# Patient Record
Sex: Male | Born: 1955 | Marital: Single | Smoking: Former smoker
Health system: Southern US, Community
[De-identification: ages and names within clinical notes are randomized; demographics above are authoritative.]

## PROBLEM LIST (undated history)

## (undated) ENCOUNTER — Emergency Department (HOSPITAL_BASED_OUTPATIENT_CLINIC_OR_DEPARTMENT_OTHER): Admission: EM | Payer: Medicare PPO

## (undated) DIAGNOSIS — K921 Melena: Secondary | ICD-10-CM

## (undated) DIAGNOSIS — C801 Malignant (primary) neoplasm, unspecified: Secondary | ICD-10-CM

## (undated) HISTORY — PX: EYE SURGERY: SHX253

## (undated) HISTORY — PX: KNEE SURGERY: SHX244

## (undated) HISTORY — DX: Melena: K92.1

---

## 2014-08-05 ENCOUNTER — Other Ambulatory Visit (HOSPITAL_COMMUNITY)
Admission: RE | Admit: 2014-08-05 | Discharge: 2014-08-05 | Disposition: A | Discharge status: Home / Self Care | Payer: BC Managed Care – PPO | Source: Ambulatory Visit | Attending: Otolaryngology | Admitting: Otolaryngology

## 2014-08-05 ENCOUNTER — Other Ambulatory Visit: Payer: Self-pay | Admitting: Otolaryngology

## 2014-08-05 DIAGNOSIS — R221 Localized swelling, mass and lump, neck: Secondary | ICD-10-CM | POA: Diagnosis present

## 2014-08-05 DIAGNOSIS — C801 Malignant (primary) neoplasm, unspecified: Secondary | ICD-10-CM

## 2014-08-05 HISTORY — DX: Malignant (primary) neoplasm, unspecified: C80.1

## 2014-08-05 HISTORY — PX: OTHER SURGICAL HISTORY: SHX169

## 2014-08-12 ENCOUNTER — Telehealth: Payer: Self-pay | Admitting: *Deleted

## 2014-08-12 NOTE — Telephone Encounter (Signed)
Left 2nd VM for [facility] with request for patient information.  Gayleen Orem, RN, BSN, [place] at [place] [phone]

## 2014-08-12 NOTE — Telephone Encounter (Signed)
Called pt to introduce myself as the oncology nurse navigator that works with Dr. Isidore Moos to whom he has been referred by Dr. Redmond Baseman, [facility]. 1. He confirmed his understanding of the referral and upcoming appt with Dr. Isidore Moos on 08/19/14 at 8:00/8:30. 2. He reported he had a CT of head/neck on 08/01/14 followed by 08/05/14 bx L tonsil and LNs.  He indicated he would fax a copy of bx report, provide a disc copy of CT scan [employer]). 3. He indicated he has consultation at [place] this Friday @ 11:00 with Dr. Berdine Addison for an additional opinion.  He agreed to call me after the appt and provide visit summary.  I encouraged him to keep appt with Dr. Isidore Moos so he can make an informed decision about his treatment and provider. 4. 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 5. I provided my contact information, encouraged him to call with questions/concerns.  Gayleen Orem, RN, BSN, [place] at [place] [phone]

## 2014-08-12 NOTE — Telephone Encounter (Signed)
LVM for [facility] with [facility] Medical Records, requesting patient information.  Asked her to return my call.  Gayleen Orem, RN, BSN, [place] at [place] [phone]

## 2014-08-12 NOTE — Telephone Encounter (Signed)
Libby with [facility] LVM indicating available information for patient is being faxed to my attention.  Gayleen Orem, RN, BSN, [place] at [place] [phone]

## 2014-08-14 ENCOUNTER — Encounter: Payer: Self-pay | Admitting: *Deleted

## 2014-08-14 ENCOUNTER — Ambulatory Visit
Admission: RE | Admit: 2014-08-14 | Discharge: 2014-08-14 | Disposition: A | Discharge status: Home / Self Care | Payer: Self-pay | Source: Ambulatory Visit | Attending: Radiation Oncology | Admitting: Radiation Oncology

## 2014-08-14 ENCOUNTER — Other Ambulatory Visit: Payer: Self-pay | Admitting: Radiation Oncology

## 2014-08-14 DIAGNOSIS — C099 Malignant neoplasm of tonsil, unspecified: Secondary | ICD-10-CM

## 2014-08-14 NOTE — Progress Notes (Signed)
Received yesterday disc from patient with recent CT scan conducted by [employer].  I delivered to [facility] this morning for import to [facility] so available for patient's consults with Drs. Isidore Moos and [place] next week Tuesday and Wednesday, respectively.  Gayleen Orem, RN, BSN, [place] at [place] [phone]

## 2014-08-18 ENCOUNTER — Telehealth: Payer: Self-pay | Admitting: *Deleted

## 2014-08-18 ENCOUNTER — Encounter: Payer: Self-pay | Admitting: Radiation Oncology

## 2014-08-18 NOTE — Telephone Encounter (Signed)
FYI Rick

## 2014-08-18 NOTE — Telephone Encounter (Signed)
Mother left VM states pt is going to be treated at [facility] and to cancel his appts w/ Dr. Alvy Bimler on 3/30.   Appts canceled.

## 2014-08-19 ENCOUNTER — Ambulatory Visit
Admission: RE | Admit: 2014-08-19 | Discharge: 2014-08-19 | Disposition: A | Discharge status: Home / Self Care | Payer: BC Managed Care – PPO | Source: Ambulatory Visit | Attending: Radiation Oncology | Admitting: Radiation Oncology

## 2014-08-19 ENCOUNTER — Ambulatory Visit: Payer: BC Managed Care – PPO

## 2014-08-19 HISTORY — DX: Malignant (primary) neoplasm, unspecified: C80.1

## 2014-08-20 ENCOUNTER — Ambulatory Visit: Payer: BC Managed Care – PPO | Admitting: Hematology and Oncology

## 2014-08-20 ENCOUNTER — Ambulatory Visit: Payer: BC Managed Care – PPO

## 2014-12-30 ENCOUNTER — Other Ambulatory Visit: Payer: Self-pay | Admitting: Podiatry

## 2014-12-30 ENCOUNTER — Ambulatory Visit (INDEPENDENT_AMBULATORY_CARE_PROVIDER_SITE_OTHER): Payer: BC Managed Care – PPO | Admitting: Podiatry

## 2014-12-30 ENCOUNTER — Encounter: Payer: Self-pay | Admitting: Podiatry

## 2014-12-30 ENCOUNTER — Ambulatory Visit (INDEPENDENT_AMBULATORY_CARE_PROVIDER_SITE_OTHER): Payer: BC Managed Care – PPO

## 2014-12-30 VITALS — BP 111/74 | HR 76 | Resp 16 | Ht 70.0 in | Wt 130.0 lb

## 2014-12-30 DIAGNOSIS — M2011 Hallux valgus (acquired), right foot: Secondary | ICD-10-CM | POA: Diagnosis not present

## 2014-12-30 DIAGNOSIS — Z0189 Encounter for other specified special examinations: Secondary | ICD-10-CM

## 2014-12-30 DIAGNOSIS — M2012 Hallux valgus (acquired), left foot: Secondary | ICD-10-CM | POA: Diagnosis not present

## 2014-12-30 DIAGNOSIS — M79672 Pain in left foot: Secondary | ICD-10-CM

## 2014-12-30 NOTE — Progress Notes (Signed)
   Subjective:    Patient ID: Bradley Oliver. [place], male    DOB: 11-Jan-1956, 59 y.o.   MRN: [id number]  HPI Patient presents with foot pain in their Left foot, arch of foot. Pt stated, "no pain now, but did several months ago".  Patient would also like to discuss his orthotics.  Patient also presents with a bunion, Left foot, medial side. This has been going on for more than 10 years.   Review of Systems  All other systems reviewed and are negative.      Objective:   Physical Exam: I have reviewed his past medical history medications allergies. Social history review of systems. Pulses are intact bilateral. Neurologic sensorium is intact percent once the monofilament. Deep tendon reflexes are intact bilateral muscle strength is 5 over 5 dorsiflexion plantar flexors and inverters everters all intrinsic musculature is intact. Orthopedic evaluation of his result joints distal to the ankle for range of motion without crepitation. 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 Radiographs do demonstrate and confirm hallux abductovalgus deformity of the left foot. Contralateral foot does not demonstrate any type of osseus abnormalities at all. Physical findings do not replicate any pain on physical evaluation. Cutaneous evaluation Mr. is supple well-hydrated cutis no erythema edema cellulitis drainage or odor.        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  Plan: Discussed etiology pathology conservative versus surgical therapies. I will follow up with him on an as-needed basis.

## 2015-05-05 ENCOUNTER — Encounter: Payer: Self-pay | Admitting: Podiatry

## 2015-05-05 ENCOUNTER — Ambulatory Visit (INDEPENDENT_AMBULATORY_CARE_PROVIDER_SITE_OTHER): Payer: BC Managed Care – PPO | Admitting: Podiatry

## 2015-05-05 VITALS — BP 120/69 | HR 57 | Resp 16

## 2015-05-05 DIAGNOSIS — M898X9 Other specified disorders of bone, unspecified site: Secondary | ICD-10-CM | POA: Diagnosis not present

## 2015-05-05 DIAGNOSIS — M2012 Hallux valgus (acquired), left foot: Secondary | ICD-10-CM | POA: Diagnosis not present

## 2015-05-05 NOTE — Progress Notes (Signed)
Bradley Oliver presents today for surgical consult regarding bunion to his left foot. As well as a dorsal exostosis.   Objective: vital signs are stable he is alert and oriented 3 are reviewed his old radiographs consistent with hypertrophic medial condyle to the head of the first metatarsal left foot and a small dorsal exostosis at the first metatarsal second metatarsal cuneiform joint. These are mildly tender to palpation with erythema overlying the medial aspect of the first metatarsal phalangeal joint.  Assessment: hallux abductovalgus deformity mild. Dorsal tarsal exostosis left foot.  Plan: consistent today for surgical procedure consisting of a McBride bunion repair left foot. Dorsal tarsal exostectomy left foot. We did discuss possible postop complications which may include but are not limited to postop pain bleeding as well as infection recurrence need for further surgery possible digit loss of limb loss of life.  He saw Dr. Patient the consent form and dispensed a cam walker.

## 2015-05-05 NOTE — Patient Instructions (Signed)
Pre-Operative Instructions  Congratulations, you have decided to take an important step to improving your quality of life.  You can be assured that the doctors of [facility] will be with you every step of the way.  1. Plan to be at the surgery center/hospital at least 1 (one) hour prior to your scheduled time unless otherwise directed by the surgical center/hospital staff.  You must have a responsible adult accompany you, remain during the surgery and drive you home.  Make sure you have directions to the surgical center/hospital and know how to get there on time. 2. For hospital based surgery you will need to obtain a history and physical form from your family physician within 1 month prior to the date of surgery- we will give you a form for you primary physician.  3. We make every effort to accommodate the date you request for surgery.  There are however, times where surgery dates or times have to be moved.  We will contact you as soon as possible if a change in schedule is required.   4. No Aspirin/Ibuprofen for one week before surgery.  If you are on aspirin, any non-steroidal anti-inflammatory medications (Mobic, Aleve, Ibuprofen) you should stop taking it 7 days prior to your surgery.  You make take Tylenol  For pain prior to surgery.  5. Medications- If you are taking daily heart and blood pressure medications, seizure, reflux, allergy, asthma, anxiety, pain or diabetes medications, make sure the surgery center/hospital is aware before the day of surgery so they may notify you which medications to take or avoid the day of surgery. 6. No food or drink after midnight the night before surgery unless directed otherwise by surgical center/hospital staff. 7. No alcoholic beverages 24 hours prior to surgery.  No smoking 24 hours prior to or 24 hours after surgery. 8. Wear loose pants or shorts- loose enough to fit over bandages, boots, and casts. 9. No slip on shoes, sneakers are best. 10. Bring  your boot with you to the surgery center/hospital.  Also bring crutches or a walker if your physician has prescribed it for you.  If you do not have this equipment, it will be provided for you after surgery. 11. If you have not been contracted by the surgery center/hospital by the day before your surgery, call to confirm the date and time of your surgery. 12. Leave-time from work may vary depending on the type of surgery you have.  Appropriate arrangements should be made prior to surgery with your employer. 13. Prescriptions will be provided immediately following surgery by your doctor.  Have these filled as soon as possible after surgery and take the medication as directed. 14. Remove nail polish on the operative foot. 15. Wash the night before surgery.  The night before surgery wash the foot and leg well with the antibacterial soap provided and water paying special attention to beneath the toenails and in between the toes.  Rinse thoroughly with water and dry well with a towel.  Perform this wash unless told not to do so by your physician.  Enclosed: 1 Ice pack (please put in freezer the night before surgery)   1 Hibiclens skin cleaner   Pre-op Instructions  If you have any questions regarding the instructions, do not hesitate to call our office.  [place]: [address] [place], [place] [address] [phone]  [place]: [address] [place], [place] [address] [phone]  [place]: [address]  [place], [place] [address] [phone]  Dr. Richard   Tuchman DPM, Dr. Norman Regal DPM Dr. Richard Sikora DPM, Dr. M. Todd Hyatt DPM, Dr. Kathryn Egerton DPM 

## 2015-05-07 ENCOUNTER — Telehealth: Payer: Self-pay | Admitting: *Deleted

## 2015-05-07 NOTE — Telephone Encounter (Signed)
Pt's mtr, Joycelyn Schmid states she has permission to discuss pt's upcoming bunion surgery.

## 2015-09-24 DIAGNOSIS — M25562 Pain in left knee: Secondary | ICD-10-CM | POA: Diagnosis not present

## 2015-09-24 DIAGNOSIS — M25552 Pain in left hip: Secondary | ICD-10-CM | POA: Diagnosis not present

## 2015-09-24 DIAGNOSIS — M1712 Unilateral primary osteoarthritis, left knee: Secondary | ICD-10-CM | POA: Diagnosis not present

## 2015-09-24 DIAGNOSIS — G8929 Other chronic pain: Secondary | ICD-10-CM | POA: Diagnosis not present

## 2015-10-12 DIAGNOSIS — M1712 Unilateral primary osteoarthritis, left knee: Secondary | ICD-10-CM | POA: Diagnosis not present

## 2015-10-21 DIAGNOSIS — M19072 Primary osteoarthritis, left ankle and foot: Secondary | ICD-10-CM | POA: Diagnosis not present

## 2015-10-21 DIAGNOSIS — M79672 Pain in left foot: Secondary | ICD-10-CM | POA: Diagnosis not present

## 2015-10-21 DIAGNOSIS — M2012 Hallux valgus (acquired), left foot: Secondary | ICD-10-CM | POA: Diagnosis not present

## 2015-11-05 DIAGNOSIS — C770 Secondary and unspecified malignant neoplasm of lymph nodes of head, face and neck: Secondary | ICD-10-CM | POA: Diagnosis not present

## 2015-11-05 DIAGNOSIS — Z85818 Personal history of malignant neoplasm of other sites of lip, oral cavity, and pharynx: Secondary | ICD-10-CM | POA: Diagnosis not present

## 2015-11-05 DIAGNOSIS — E039 Hypothyroidism, unspecified: Secondary | ICD-10-CM | POA: Diagnosis not present

## 2015-11-05 DIAGNOSIS — Z79899 Other long term (current) drug therapy: Secondary | ICD-10-CM | POA: Diagnosis not present

## 2015-11-05 DIAGNOSIS — C099 Malignant neoplasm of tonsil, unspecified: Secondary | ICD-10-CM | POA: Diagnosis not present

## 2015-11-05 DIAGNOSIS — Z87891 Personal history of nicotine dependence: Secondary | ICD-10-CM | POA: Diagnosis not present

## 2015-11-05 DIAGNOSIS — Z08 Encounter for follow-up examination after completed treatment for malignant neoplasm: Secondary | ICD-10-CM | POA: Diagnosis not present

## 2015-11-23 DIAGNOSIS — Z923 Personal history of irradiation: Secondary | ICD-10-CM | POA: Diagnosis not present

## 2015-11-23 DIAGNOSIS — C099 Malignant neoplasm of tonsil, unspecified: Secondary | ICD-10-CM | POA: Diagnosis not present

## 2015-11-26 DIAGNOSIS — Z8639 Personal history of other endocrine, nutritional and metabolic disease: Secondary | ICD-10-CM | POA: Diagnosis not present

## 2015-11-26 DIAGNOSIS — R634 Abnormal weight loss: Secondary | ICD-10-CM | POA: Diagnosis not present

## 2015-11-26 DIAGNOSIS — Z923 Personal history of irradiation: Secondary | ICD-10-CM | POA: Diagnosis not present

## 2015-11-26 DIAGNOSIS — K219 Gastro-esophageal reflux disease without esophagitis: Secondary | ICD-10-CM | POA: Diagnosis not present

## 2015-11-26 DIAGNOSIS — G8929 Other chronic pain: Secondary | ICD-10-CM | POA: Diagnosis not present

## 2015-11-26 DIAGNOSIS — C099 Malignant neoplasm of tonsil, unspecified: Secondary | ICD-10-CM | POA: Diagnosis not present

## 2015-11-26 DIAGNOSIS — M25562 Pain in left knee: Secondary | ICD-10-CM | POA: Diagnosis not present

## 2015-11-26 DIAGNOSIS — Z01818 Encounter for other preprocedural examination: Secondary | ICD-10-CM | POA: Diagnosis not present

## 2015-11-26 DIAGNOSIS — M1712 Unilateral primary osteoarthritis, left knee: Secondary | ICD-10-CM | POA: Diagnosis not present

## 2015-12-30 DIAGNOSIS — Z87891 Personal history of nicotine dependence: Secondary | ICD-10-CM | POA: Diagnosis not present

## 2015-12-30 DIAGNOSIS — K219 Gastro-esophageal reflux disease without esophagitis: Secondary | ICD-10-CM | POA: Diagnosis not present

## 2015-12-30 DIAGNOSIS — E162 Hypoglycemia, unspecified: Secondary | ICD-10-CM | POA: Diagnosis not present

## 2015-12-30 DIAGNOSIS — G8918 Other acute postprocedural pain: Secondary | ICD-10-CM | POA: Diagnosis not present

## 2015-12-30 DIAGNOSIS — Z471 Aftercare following joint replacement surgery: Secondary | ICD-10-CM | POA: Diagnosis not present

## 2015-12-30 DIAGNOSIS — Z8639 Personal history of other endocrine, nutritional and metabolic disease: Secondary | ICD-10-CM | POA: Diagnosis not present

## 2015-12-30 DIAGNOSIS — M1712 Unilateral primary osteoarthritis, left knee: Secondary | ICD-10-CM | POA: Diagnosis not present

## 2015-12-30 DIAGNOSIS — Z9221 Personal history of antineoplastic chemotherapy: Secondary | ICD-10-CM | POA: Diagnosis not present

## 2015-12-30 DIAGNOSIS — R1310 Dysphagia, unspecified: Secondary | ICD-10-CM | POA: Diagnosis not present

## 2015-12-30 DIAGNOSIS — M25562 Pain in left knee: Secondary | ICD-10-CM | POA: Diagnosis not present

## 2015-12-30 DIAGNOSIS — Z923 Personal history of irradiation: Secondary | ICD-10-CM | POA: Diagnosis not present

## 2015-12-30 DIAGNOSIS — Z7982 Long term (current) use of aspirin: Secondary | ICD-10-CM | POA: Diagnosis not present

## 2015-12-30 DIAGNOSIS — Z85818 Personal history of malignant neoplasm of other sites of lip, oral cavity, and pharynx: Secondary | ICD-10-CM | POA: Diagnosis not present

## 2015-12-30 DIAGNOSIS — K210 Gastro-esophageal reflux disease with esophagitis: Secondary | ICD-10-CM | POA: Diagnosis not present

## 2015-12-30 DIAGNOSIS — Z96652 Presence of left artificial knee joint: Secondary | ICD-10-CM | POA: Diagnosis not present

## 2015-12-30 DIAGNOSIS — C099 Malignant neoplasm of tonsil, unspecified: Secondary | ICD-10-CM | POA: Diagnosis not present

## 2015-12-31 DIAGNOSIS — Z96652 Presence of left artificial knee joint: Secondary | ICD-10-CM | POA: Diagnosis not present

## 2015-12-31 DIAGNOSIS — M1712 Unilateral primary osteoarthritis, left knee: Secondary | ICD-10-CM | POA: Diagnosis not present

## 2015-12-31 DIAGNOSIS — K219 Gastro-esophageal reflux disease without esophagitis: Secondary | ICD-10-CM | POA: Diagnosis not present

## 2015-12-31 DIAGNOSIS — Z8639 Personal history of other endocrine, nutritional and metabolic disease: Secondary | ICD-10-CM | POA: Diagnosis not present

## 2016-01-01 DIAGNOSIS — C140 Malignant neoplasm of pharynx, unspecified: Secondary | ICD-10-CM | POA: Diagnosis not present

## 2016-01-01 DIAGNOSIS — C099 Malignant neoplasm of tonsil, unspecified: Secondary | ICD-10-CM | POA: Diagnosis not present

## 2016-01-01 DIAGNOSIS — M1712 Unilateral primary osteoarthritis, left knee: Secondary | ICD-10-CM | POA: Diagnosis not present

## 2016-01-01 DIAGNOSIS — Z96652 Presence of left artificial knee joint: Secondary | ICD-10-CM | POA: Diagnosis not present

## 2016-01-01 DIAGNOSIS — C770 Secondary and unspecified malignant neoplasm of lymph nodes of head, face and neck: Secondary | ICD-10-CM | POA: Diagnosis not present

## 2016-01-01 DIAGNOSIS — M19072 Primary osteoarthritis, left ankle and foot: Secondary | ICD-10-CM | POA: Diagnosis not present

## 2016-01-01 DIAGNOSIS — Z7982 Long term (current) use of aspirin: Secondary | ICD-10-CM | POA: Diagnosis not present

## 2016-01-01 DIAGNOSIS — Z471 Aftercare following joint replacement surgery: Secondary | ICD-10-CM | POA: Diagnosis not present

## 2016-01-01 DIAGNOSIS — K219 Gastro-esophageal reflux disease without esophagitis: Secondary | ICD-10-CM | POA: Diagnosis not present

## 2016-01-05 DIAGNOSIS — C099 Malignant neoplasm of tonsil, unspecified: Secondary | ICD-10-CM | POA: Diagnosis not present

## 2016-01-05 DIAGNOSIS — M19072 Primary osteoarthritis, left ankle and foot: Secondary | ICD-10-CM | POA: Diagnosis not present

## 2016-01-05 DIAGNOSIS — Z7982 Long term (current) use of aspirin: Secondary | ICD-10-CM | POA: Diagnosis not present

## 2016-01-05 DIAGNOSIS — K219 Gastro-esophageal reflux disease without esophagitis: Secondary | ICD-10-CM | POA: Diagnosis not present

## 2016-01-05 DIAGNOSIS — Z471 Aftercare following joint replacement surgery: Secondary | ICD-10-CM | POA: Diagnosis not present

## 2016-01-05 DIAGNOSIS — C140 Malignant neoplasm of pharynx, unspecified: Secondary | ICD-10-CM | POA: Diagnosis not present

## 2016-01-05 DIAGNOSIS — Z96652 Presence of left artificial knee joint: Secondary | ICD-10-CM | POA: Diagnosis not present

## 2016-01-05 DIAGNOSIS — C770 Secondary and unspecified malignant neoplasm of lymph nodes of head, face and neck: Secondary | ICD-10-CM | POA: Diagnosis not present

## 2016-01-07 DIAGNOSIS — Z96652 Presence of left artificial knee joint: Secondary | ICD-10-CM | POA: Diagnosis not present

## 2016-01-07 DIAGNOSIS — Z7982 Long term (current) use of aspirin: Secondary | ICD-10-CM | POA: Diagnosis not present

## 2016-01-07 DIAGNOSIS — M19072 Primary osteoarthritis, left ankle and foot: Secondary | ICD-10-CM | POA: Diagnosis not present

## 2016-01-07 DIAGNOSIS — C099 Malignant neoplasm of tonsil, unspecified: Secondary | ICD-10-CM | POA: Diagnosis not present

## 2016-01-07 DIAGNOSIS — C770 Secondary and unspecified malignant neoplasm of lymph nodes of head, face and neck: Secondary | ICD-10-CM | POA: Diagnosis not present

## 2016-01-07 DIAGNOSIS — C140 Malignant neoplasm of pharynx, unspecified: Secondary | ICD-10-CM | POA: Diagnosis not present

## 2016-01-07 DIAGNOSIS — K219 Gastro-esophageal reflux disease without esophagitis: Secondary | ICD-10-CM | POA: Diagnosis not present

## 2016-01-07 DIAGNOSIS — Z471 Aftercare following joint replacement surgery: Secondary | ICD-10-CM | POA: Diagnosis not present

## 2016-01-11 DIAGNOSIS — Z7982 Long term (current) use of aspirin: Secondary | ICD-10-CM | POA: Diagnosis not present

## 2016-01-11 DIAGNOSIS — K219 Gastro-esophageal reflux disease without esophagitis: Secondary | ICD-10-CM | POA: Diagnosis not present

## 2016-01-11 DIAGNOSIS — Z96652 Presence of left artificial knee joint: Secondary | ICD-10-CM | POA: Diagnosis not present

## 2016-01-11 DIAGNOSIS — C140 Malignant neoplasm of pharynx, unspecified: Secondary | ICD-10-CM | POA: Diagnosis not present

## 2016-01-11 DIAGNOSIS — C099 Malignant neoplasm of tonsil, unspecified: Secondary | ICD-10-CM | POA: Diagnosis not present

## 2016-01-11 DIAGNOSIS — Z471 Aftercare following joint replacement surgery: Secondary | ICD-10-CM | POA: Diagnosis not present

## 2016-01-11 DIAGNOSIS — C770 Secondary and unspecified malignant neoplasm of lymph nodes of head, face and neck: Secondary | ICD-10-CM | POA: Diagnosis not present

## 2016-01-11 DIAGNOSIS — M19072 Primary osteoarthritis, left ankle and foot: Secondary | ICD-10-CM | POA: Diagnosis not present

## 2016-01-13 DIAGNOSIS — K219 Gastro-esophageal reflux disease without esophagitis: Secondary | ICD-10-CM | POA: Diagnosis not present

## 2016-01-13 DIAGNOSIS — M19072 Primary osteoarthritis, left ankle and foot: Secondary | ICD-10-CM | POA: Diagnosis not present

## 2016-01-13 DIAGNOSIS — C770 Secondary and unspecified malignant neoplasm of lymph nodes of head, face and neck: Secondary | ICD-10-CM | POA: Diagnosis not present

## 2016-01-13 DIAGNOSIS — C099 Malignant neoplasm of tonsil, unspecified: Secondary | ICD-10-CM | POA: Diagnosis not present

## 2016-01-13 DIAGNOSIS — Z96652 Presence of left artificial knee joint: Secondary | ICD-10-CM | POA: Diagnosis not present

## 2016-01-13 DIAGNOSIS — Z471 Aftercare following joint replacement surgery: Secondary | ICD-10-CM | POA: Diagnosis not present

## 2016-01-13 DIAGNOSIS — Z7982 Long term (current) use of aspirin: Secondary | ICD-10-CM | POA: Diagnosis not present

## 2016-01-13 DIAGNOSIS — C140 Malignant neoplasm of pharynx, unspecified: Secondary | ICD-10-CM | POA: Diagnosis not present

## 2016-01-18 DIAGNOSIS — Z471 Aftercare following joint replacement surgery: Secondary | ICD-10-CM | POA: Diagnosis not present

## 2016-01-18 DIAGNOSIS — C770 Secondary and unspecified malignant neoplasm of lymph nodes of head, face and neck: Secondary | ICD-10-CM | POA: Diagnosis not present

## 2016-01-18 DIAGNOSIS — M19072 Primary osteoarthritis, left ankle and foot: Secondary | ICD-10-CM | POA: Diagnosis not present

## 2016-01-18 DIAGNOSIS — K219 Gastro-esophageal reflux disease without esophagitis: Secondary | ICD-10-CM | POA: Diagnosis not present

## 2016-01-18 DIAGNOSIS — C140 Malignant neoplasm of pharynx, unspecified: Secondary | ICD-10-CM | POA: Diagnosis not present

## 2016-01-18 DIAGNOSIS — Z7982 Long term (current) use of aspirin: Secondary | ICD-10-CM | POA: Diagnosis not present

## 2016-01-18 DIAGNOSIS — C099 Malignant neoplasm of tonsil, unspecified: Secondary | ICD-10-CM | POA: Diagnosis not present

## 2016-01-18 DIAGNOSIS — Z96652 Presence of left artificial knee joint: Secondary | ICD-10-CM | POA: Diagnosis not present

## 2016-01-20 DIAGNOSIS — C099 Malignant neoplasm of tonsil, unspecified: Secondary | ICD-10-CM | POA: Diagnosis not present

## 2016-01-20 DIAGNOSIS — Z7982 Long term (current) use of aspirin: Secondary | ICD-10-CM | POA: Diagnosis not present

## 2016-01-20 DIAGNOSIS — C140 Malignant neoplasm of pharynx, unspecified: Secondary | ICD-10-CM | POA: Diagnosis not present

## 2016-01-20 DIAGNOSIS — Z471 Aftercare following joint replacement surgery: Secondary | ICD-10-CM | POA: Diagnosis not present

## 2016-01-20 DIAGNOSIS — Z96652 Presence of left artificial knee joint: Secondary | ICD-10-CM | POA: Diagnosis not present

## 2016-01-20 DIAGNOSIS — K219 Gastro-esophageal reflux disease without esophagitis: Secondary | ICD-10-CM | POA: Diagnosis not present

## 2016-01-20 DIAGNOSIS — M19072 Primary osteoarthritis, left ankle and foot: Secondary | ICD-10-CM | POA: Diagnosis not present

## 2016-01-20 DIAGNOSIS — C770 Secondary and unspecified malignant neoplasm of lymph nodes of head, face and neck: Secondary | ICD-10-CM | POA: Diagnosis not present

## 2016-02-08 DIAGNOSIS — Z96652 Presence of left artificial knee joint: Secondary | ICD-10-CM | POA: Diagnosis not present

## 2016-02-08 DIAGNOSIS — R531 Weakness: Secondary | ICD-10-CM | POA: Diagnosis not present

## 2016-02-08 DIAGNOSIS — M25562 Pain in left knee: Secondary | ICD-10-CM | POA: Diagnosis not present

## 2016-02-08 DIAGNOSIS — M25662 Stiffness of left knee, not elsewhere classified: Secondary | ICD-10-CM | POA: Diagnosis not present

## 2016-02-10 DIAGNOSIS — Z79899 Other long term (current) drug therapy: Secondary | ICD-10-CM | POA: Diagnosis not present

## 2016-02-10 DIAGNOSIS — Z85818 Personal history of malignant neoplasm of other sites of lip, oral cavity, and pharynx: Secondary | ICD-10-CM | POA: Diagnosis not present

## 2016-02-10 DIAGNOSIS — C770 Secondary and unspecified malignant neoplasm of lymph nodes of head, face and neck: Secondary | ICD-10-CM | POA: Diagnosis not present

## 2016-02-10 DIAGNOSIS — Z96652 Presence of left artificial knee joint: Secondary | ICD-10-CM | POA: Diagnosis not present

## 2016-02-10 DIAGNOSIS — Z7982 Long term (current) use of aspirin: Secondary | ICD-10-CM | POA: Diagnosis not present

## 2016-02-10 DIAGNOSIS — R682 Dry mouth, unspecified: Secondary | ICD-10-CM | POA: Diagnosis not present

## 2016-02-10 DIAGNOSIS — Z08 Encounter for follow-up examination after completed treatment for malignant neoplasm: Secondary | ICD-10-CM | POA: Diagnosis not present

## 2016-02-11 DIAGNOSIS — R531 Weakness: Secondary | ICD-10-CM | POA: Diagnosis not present

## 2016-02-11 DIAGNOSIS — M25562 Pain in left knee: Secondary | ICD-10-CM | POA: Diagnosis not present

## 2016-02-11 DIAGNOSIS — Z96652 Presence of left artificial knee joint: Secondary | ICD-10-CM | POA: Diagnosis not present

## 2016-02-11 DIAGNOSIS — M25662 Stiffness of left knee, not elsewhere classified: Secondary | ICD-10-CM | POA: Diagnosis not present

## 2016-02-15 DIAGNOSIS — G8929 Other chronic pain: Secondary | ICD-10-CM | POA: Diagnosis not present

## 2016-02-15 DIAGNOSIS — M25562 Pain in left knee: Secondary | ICD-10-CM | POA: Diagnosis not present

## 2016-02-15 DIAGNOSIS — Z96652 Presence of left artificial knee joint: Secondary | ICD-10-CM | POA: Diagnosis not present

## 2016-02-15 DIAGNOSIS — Z471 Aftercare following joint replacement surgery: Secondary | ICD-10-CM | POA: Diagnosis not present

## 2016-02-16 DIAGNOSIS — M25562 Pain in left knee: Secondary | ICD-10-CM | POA: Diagnosis not present

## 2016-02-16 DIAGNOSIS — R531 Weakness: Secondary | ICD-10-CM | POA: Diagnosis not present

## 2016-02-16 DIAGNOSIS — Z96652 Presence of left artificial knee joint: Secondary | ICD-10-CM | POA: Diagnosis not present

## 2016-02-16 DIAGNOSIS — M25662 Stiffness of left knee, not elsewhere classified: Secondary | ICD-10-CM | POA: Diagnosis not present

## 2016-02-17 DIAGNOSIS — Z96652 Presence of left artificial knee joint: Secondary | ICD-10-CM | POA: Diagnosis not present

## 2016-02-17 DIAGNOSIS — M25662 Stiffness of left knee, not elsewhere classified: Secondary | ICD-10-CM | POA: Diagnosis not present

## 2016-02-17 DIAGNOSIS — R531 Weakness: Secondary | ICD-10-CM | POA: Diagnosis not present

## 2016-02-17 DIAGNOSIS — M25562 Pain in left knee: Secondary | ICD-10-CM | POA: Diagnosis not present

## 2016-02-22 DIAGNOSIS — M25562 Pain in left knee: Secondary | ICD-10-CM | POA: Diagnosis not present

## 2016-02-22 DIAGNOSIS — M25662 Stiffness of left knee, not elsewhere classified: Secondary | ICD-10-CM | POA: Diagnosis not present

## 2016-02-22 DIAGNOSIS — Z96652 Presence of left artificial knee joint: Secondary | ICD-10-CM | POA: Diagnosis not present

## 2016-02-22 DIAGNOSIS — R531 Weakness: Secondary | ICD-10-CM | POA: Diagnosis not present

## 2016-02-26 DIAGNOSIS — Z96652 Presence of left artificial knee joint: Secondary | ICD-10-CM | POA: Diagnosis not present

## 2016-02-26 DIAGNOSIS — R531 Weakness: Secondary | ICD-10-CM | POA: Diagnosis not present

## 2016-02-26 DIAGNOSIS — M25662 Stiffness of left knee, not elsewhere classified: Secondary | ICD-10-CM | POA: Diagnosis not present

## 2016-02-26 DIAGNOSIS — M25562 Pain in left knee: Secondary | ICD-10-CM | POA: Diagnosis not present

## 2016-02-29 DIAGNOSIS — M25662 Stiffness of left knee, not elsewhere classified: Secondary | ICD-10-CM | POA: Diagnosis not present

## 2016-02-29 DIAGNOSIS — R531 Weakness: Secondary | ICD-10-CM | POA: Diagnosis not present

## 2016-02-29 DIAGNOSIS — M25562 Pain in left knee: Secondary | ICD-10-CM | POA: Diagnosis not present

## 2016-02-29 DIAGNOSIS — Z96652 Presence of left artificial knee joint: Secondary | ICD-10-CM | POA: Diagnosis not present

## 2016-03-02 DIAGNOSIS — R531 Weakness: Secondary | ICD-10-CM | POA: Diagnosis not present

## 2016-03-02 DIAGNOSIS — M25662 Stiffness of left knee, not elsewhere classified: Secondary | ICD-10-CM | POA: Diagnosis not present

## 2016-03-02 DIAGNOSIS — Z96652 Presence of left artificial knee joint: Secondary | ICD-10-CM | POA: Diagnosis not present

## 2016-03-02 DIAGNOSIS — M25562 Pain in left knee: Secondary | ICD-10-CM | POA: Diagnosis not present

## 2016-03-07 DIAGNOSIS — Z96652 Presence of left artificial knee joint: Secondary | ICD-10-CM | POA: Diagnosis not present

## 2016-03-07 DIAGNOSIS — R531 Weakness: Secondary | ICD-10-CM | POA: Diagnosis not present

## 2016-03-07 DIAGNOSIS — M25662 Stiffness of left knee, not elsewhere classified: Secondary | ICD-10-CM | POA: Diagnosis not present

## 2016-03-07 DIAGNOSIS — M25562 Pain in left knee: Secondary | ICD-10-CM | POA: Diagnosis not present

## 2016-03-09 DIAGNOSIS — Z96652 Presence of left artificial knee joint: Secondary | ICD-10-CM | POA: Diagnosis not present

## 2016-03-09 DIAGNOSIS — M25562 Pain in left knee: Secondary | ICD-10-CM | POA: Diagnosis not present

## 2016-03-09 DIAGNOSIS — R531 Weakness: Secondary | ICD-10-CM | POA: Diagnosis not present

## 2016-03-09 DIAGNOSIS — M25662 Stiffness of left knee, not elsewhere classified: Secondary | ICD-10-CM | POA: Diagnosis not present

## 2016-03-14 DIAGNOSIS — M25662 Stiffness of left knee, not elsewhere classified: Secondary | ICD-10-CM | POA: Diagnosis not present

## 2016-03-14 DIAGNOSIS — R531 Weakness: Secondary | ICD-10-CM | POA: Diagnosis not present

## 2016-03-14 DIAGNOSIS — Z96652 Presence of left artificial knee joint: Secondary | ICD-10-CM | POA: Diagnosis not present

## 2016-03-14 DIAGNOSIS — M25562 Pain in left knee: Secondary | ICD-10-CM | POA: Diagnosis not present

## 2016-03-16 DIAGNOSIS — M25562 Pain in left knee: Secondary | ICD-10-CM | POA: Diagnosis not present

## 2016-03-16 DIAGNOSIS — M25662 Stiffness of left knee, not elsewhere classified: Secondary | ICD-10-CM | POA: Diagnosis not present

## 2016-03-16 DIAGNOSIS — Z96652 Presence of left artificial knee joint: Secondary | ICD-10-CM | POA: Diagnosis not present

## 2016-03-16 DIAGNOSIS — R531 Weakness: Secondary | ICD-10-CM | POA: Diagnosis not present

## 2016-03-21 DIAGNOSIS — R531 Weakness: Secondary | ICD-10-CM | POA: Diagnosis not present

## 2016-03-21 DIAGNOSIS — M25662 Stiffness of left knee, not elsewhere classified: Secondary | ICD-10-CM | POA: Diagnosis not present

## 2016-03-21 DIAGNOSIS — Z96652 Presence of left artificial knee joint: Secondary | ICD-10-CM | POA: Diagnosis not present

## 2016-03-21 DIAGNOSIS — M25562 Pain in left knee: Secondary | ICD-10-CM | POA: Diagnosis not present

## 2016-03-23 DIAGNOSIS — M25562 Pain in left knee: Secondary | ICD-10-CM | POA: Diagnosis not present

## 2016-03-23 DIAGNOSIS — M25662 Stiffness of left knee, not elsewhere classified: Secondary | ICD-10-CM | POA: Diagnosis not present

## 2016-03-23 DIAGNOSIS — R531 Weakness: Secondary | ICD-10-CM | POA: Diagnosis not present

## 2016-03-23 DIAGNOSIS — Z96652 Presence of left artificial knee joint: Secondary | ICD-10-CM | POA: Diagnosis not present

## 2016-03-30 DIAGNOSIS — M25562 Pain in left knee: Secondary | ICD-10-CM | POA: Diagnosis not present

## 2016-03-30 DIAGNOSIS — Z96652 Presence of left artificial knee joint: Secondary | ICD-10-CM | POA: Diagnosis not present

## 2016-03-30 DIAGNOSIS — R531 Weakness: Secondary | ICD-10-CM | POA: Diagnosis not present

## 2016-03-30 DIAGNOSIS — M25662 Stiffness of left knee, not elsewhere classified: Secondary | ICD-10-CM | POA: Diagnosis not present

## 2016-03-31 DIAGNOSIS — K921 Melena: Secondary | ICD-10-CM | POA: Diagnosis not present

## 2016-03-31 DIAGNOSIS — C140 Malignant neoplasm of pharynx, unspecified: Secondary | ICD-10-CM | POA: Diagnosis not present

## 2016-03-31 DIAGNOSIS — K5900 Constipation, unspecified: Secondary | ICD-10-CM | POA: Diagnosis not present

## 2016-04-01 DIAGNOSIS — E784 Other hyperlipidemia: Secondary | ICD-10-CM | POA: Diagnosis not present

## 2016-04-01 DIAGNOSIS — Z13220 Encounter for screening for lipoid disorders: Secondary | ICD-10-CM | POA: Diagnosis not present

## 2016-04-01 DIAGNOSIS — Z136 Encounter for screening for cardiovascular disorders: Secondary | ICD-10-CM | POA: Diagnosis not present

## 2016-04-01 DIAGNOSIS — C024 Malignant neoplasm of lingual tonsil: Secondary | ICD-10-CM | POA: Diagnosis not present

## 2016-04-01 DIAGNOSIS — M179 Osteoarthritis of knee, unspecified: Secondary | ICD-10-CM | POA: Diagnosis not present

## 2016-04-01 DIAGNOSIS — Z125 Encounter for screening for malignant neoplasm of prostate: Secondary | ICD-10-CM | POA: Diagnosis not present

## 2016-04-01 DIAGNOSIS — Z131 Encounter for screening for diabetes mellitus: Secondary | ICD-10-CM | POA: Diagnosis not present

## 2016-04-04 DIAGNOSIS — M25519 Pain in unspecified shoulder: Secondary | ICD-10-CM | POA: Diagnosis not present

## 2016-04-05 DIAGNOSIS — Z96652 Presence of left artificial knee joint: Secondary | ICD-10-CM | POA: Diagnosis not present

## 2016-04-05 DIAGNOSIS — R531 Weakness: Secondary | ICD-10-CM | POA: Diagnosis not present

## 2016-04-05 DIAGNOSIS — M25562 Pain in left knee: Secondary | ICD-10-CM | POA: Diagnosis not present

## 2016-04-05 DIAGNOSIS — M25662 Stiffness of left knee, not elsewhere classified: Secondary | ICD-10-CM | POA: Diagnosis not present

## 2016-04-18 DIAGNOSIS — Z136 Encounter for screening for cardiovascular disorders: Secondary | ICD-10-CM | POA: Diagnosis not present

## 2016-04-27 DIAGNOSIS — R531 Weakness: Secondary | ICD-10-CM | POA: Diagnosis not present

## 2016-04-27 DIAGNOSIS — Z96652 Presence of left artificial knee joint: Secondary | ICD-10-CM | POA: Diagnosis not present

## 2016-04-27 DIAGNOSIS — M25662 Stiffness of left knee, not elsewhere classified: Secondary | ICD-10-CM | POA: Diagnosis not present

## 2016-04-27 DIAGNOSIS — M25562 Pain in left knee: Secondary | ICD-10-CM | POA: Diagnosis not present

## 2016-04-28 DIAGNOSIS — Q6621 Congenital metatarsus primus varus: Secondary | ICD-10-CM | POA: Diagnosis not present

## 2016-04-28 DIAGNOSIS — M205X2 Other deformities of toe(s) (acquired), left foot: Secondary | ICD-10-CM | POA: Diagnosis not present

## 2016-04-28 DIAGNOSIS — Z87891 Personal history of nicotine dependence: Secondary | ICD-10-CM | POA: Diagnosis not present

## 2016-04-28 DIAGNOSIS — M2012 Hallux valgus (acquired), left foot: Secondary | ICD-10-CM | POA: Diagnosis not present

## 2016-05-05 DIAGNOSIS — R918 Other nonspecific abnormal finding of lung field: Secondary | ICD-10-CM | POA: Diagnosis not present

## 2016-05-05 DIAGNOSIS — C140 Malignant neoplasm of pharynx, unspecified: Secondary | ICD-10-CM | POA: Diagnosis not present

## 2016-05-05 DIAGNOSIS — C099 Malignant neoplasm of tonsil, unspecified: Secondary | ICD-10-CM | POA: Diagnosis not present

## 2016-05-05 DIAGNOSIS — E039 Hypothyroidism, unspecified: Secondary | ICD-10-CM | POA: Diagnosis not present

## 2016-05-05 DIAGNOSIS — C770 Secondary and unspecified malignant neoplasm of lymph nodes of head, face and neck: Secondary | ICD-10-CM | POA: Diagnosis not present

## 2016-05-11 DIAGNOSIS — H4722 Hereditary optic atrophy: Secondary | ICD-10-CM | POA: Diagnosis not present

## 2016-05-11 DIAGNOSIS — H40023 Open angle with borderline findings, high risk, bilateral: Secondary | ICD-10-CM | POA: Diagnosis not present

## 2016-05-12 DIAGNOSIS — Z923 Personal history of irradiation: Secondary | ICD-10-CM | POA: Diagnosis not present

## 2016-05-12 DIAGNOSIS — C099 Malignant neoplasm of tonsil, unspecified: Secondary | ICD-10-CM | POA: Diagnosis not present

## 2016-05-12 DIAGNOSIS — C770 Secondary and unspecified malignant neoplasm of lymph nodes of head, face and neck: Secondary | ICD-10-CM | POA: Diagnosis not present

## 2016-06-01 DIAGNOSIS — M21612 Bunion of left foot: Secondary | ICD-10-CM | POA: Diagnosis not present

## 2016-06-01 DIAGNOSIS — Z4889 Encounter for other specified surgical aftercare: Secondary | ICD-10-CM | POA: Diagnosis not present

## 2016-07-06 DIAGNOSIS — R4189 Other symptoms and signs involving cognitive functions and awareness: Secondary | ICD-10-CM | POA: Diagnosis not present

## 2016-08-16 DIAGNOSIS — C770 Secondary and unspecified malignant neoplasm of lymph nodes of head, face and neck: Secondary | ICD-10-CM | POA: Diagnosis not present

## 2016-11-03 DIAGNOSIS — C099 Malignant neoplasm of tonsil, unspecified: Secondary | ICD-10-CM | POA: Diagnosis not present

## 2016-11-03 DIAGNOSIS — C770 Secondary and unspecified malignant neoplasm of lymph nodes of head, face and neck: Secondary | ICD-10-CM | POA: Diagnosis not present

## 2016-11-03 DIAGNOSIS — Z923 Personal history of irradiation: Secondary | ICD-10-CM | POA: Diagnosis not present

## 2016-11-10 DIAGNOSIS — C140 Malignant neoplasm of pharynx, unspecified: Secondary | ICD-10-CM | POA: Diagnosis not present

## 2016-11-10 DIAGNOSIS — K921 Melena: Secondary | ICD-10-CM | POA: Diagnosis not present

## 2016-11-15 DIAGNOSIS — C770 Secondary and unspecified malignant neoplasm of lymph nodes of head, face and neck: Secondary | ICD-10-CM | POA: Diagnosis not present

## 2016-11-15 DIAGNOSIS — Z85818 Personal history of malignant neoplasm of other sites of lip, oral cavity, and pharynx: Secondary | ICD-10-CM | POA: Diagnosis not present

## 2016-11-15 DIAGNOSIS — Z08 Encounter for follow-up examination after completed treatment for malignant neoplasm: Secondary | ICD-10-CM | POA: Diagnosis not present

## 2016-11-15 DIAGNOSIS — Z9221 Personal history of antineoplastic chemotherapy: Secondary | ICD-10-CM | POA: Diagnosis not present

## 2016-11-15 DIAGNOSIS — Z923 Personal history of irradiation: Secondary | ICD-10-CM | POA: Diagnosis not present

## 2016-11-15 DIAGNOSIS — C099 Malignant neoplasm of tonsil, unspecified: Secondary | ICD-10-CM | POA: Diagnosis not present

## 2016-12-06 DIAGNOSIS — K640 First degree hemorrhoids: Secondary | ICD-10-CM | POA: Diagnosis not present

## 2016-12-06 DIAGNOSIS — R195 Other fecal abnormalities: Secondary | ICD-10-CM | POA: Diagnosis not present

## 2017-01-18 DIAGNOSIS — Z13220 Encounter for screening for lipoid disorders: Secondary | ICD-10-CM | POA: Diagnosis not present

## 2017-01-18 DIAGNOSIS — Z125 Encounter for screening for malignant neoplasm of prostate: Secondary | ICD-10-CM | POA: Diagnosis not present

## 2017-01-18 DIAGNOSIS — Z131 Encounter for screening for diabetes mellitus: Secondary | ICD-10-CM | POA: Diagnosis not present

## 2017-01-18 DIAGNOSIS — Z0000 Encounter for general adult medical examination without abnormal findings: Secondary | ICD-10-CM | POA: Diagnosis not present

## 2017-01-18 DIAGNOSIS — Z0001 Encounter for general adult medical examination with abnormal findings: Secondary | ICD-10-CM | POA: Diagnosis not present

## 2017-01-18 DIAGNOSIS — M179 Osteoarthritis of knee, unspecified: Secondary | ICD-10-CM | POA: Diagnosis not present

## 2017-01-18 DIAGNOSIS — K219 Gastro-esophageal reflux disease without esophagitis: Secondary | ICD-10-CM | POA: Diagnosis not present

## 2017-03-15 DIAGNOSIS — J209 Acute bronchitis, unspecified: Secondary | ICD-10-CM | POA: Diagnosis not present

## 2017-03-15 DIAGNOSIS — R972 Elevated prostate specific antigen [PSA]: Secondary | ICD-10-CM | POA: Diagnosis not present

## 2017-03-15 DIAGNOSIS — M179 Osteoarthritis of knee, unspecified: Secondary | ICD-10-CM | POA: Diagnosis not present

## 2017-03-23 DIAGNOSIS — R972 Elevated prostate specific antigen [PSA]: Secondary | ICD-10-CM | POA: Diagnosis not present

## 2017-04-11 DIAGNOSIS — H40023 Open angle with borderline findings, high risk, bilateral: Secondary | ICD-10-CM | POA: Diagnosis not present

## 2017-04-18 DIAGNOSIS — J209 Acute bronchitis, unspecified: Secondary | ICD-10-CM | POA: Diagnosis not present

## 2017-04-18 DIAGNOSIS — C024 Malignant neoplasm of lingual tonsil: Secondary | ICD-10-CM | POA: Diagnosis not present

## 2017-04-18 DIAGNOSIS — Z23 Encounter for immunization: Secondary | ICD-10-CM | POA: Diagnosis not present

## 2017-04-19 DIAGNOSIS — J40 Bronchitis, not specified as acute or chronic: Secondary | ICD-10-CM | POA: Diagnosis not present

## 2017-05-17 DIAGNOSIS — C099 Malignant neoplasm of tonsil, unspecified: Secondary | ICD-10-CM | POA: Diagnosis not present

## 2017-05-17 DIAGNOSIS — Z08 Encounter for follow-up examination after completed treatment for malignant neoplasm: Secondary | ICD-10-CM | POA: Diagnosis not present

## 2017-05-17 DIAGNOSIS — C770 Secondary and unspecified malignant neoplasm of lymph nodes of head, face and neck: Secondary | ICD-10-CM | POA: Diagnosis not present

## 2017-05-17 DIAGNOSIS — Z8589 Personal history of malignant neoplasm of other organs and systems: Secondary | ICD-10-CM | POA: Diagnosis not present

## 2017-05-17 DIAGNOSIS — Z923 Personal history of irradiation: Secondary | ICD-10-CM | POA: Diagnosis not present

## 2017-09-12 DIAGNOSIS — R634 Abnormal weight loss: Secondary | ICD-10-CM | POA: Diagnosis not present

## 2017-09-12 DIAGNOSIS — C099 Malignant neoplasm of tonsil, unspecified: Secondary | ICD-10-CM | POA: Diagnosis not present

## 2017-09-12 DIAGNOSIS — Z08 Encounter for follow-up examination after completed treatment for malignant neoplasm: Secondary | ICD-10-CM | POA: Diagnosis not present

## 2017-09-12 DIAGNOSIS — Z923 Personal history of irradiation: Secondary | ICD-10-CM | POA: Diagnosis not present

## 2017-09-12 DIAGNOSIS — J3801 Paralysis of vocal cords and larynx, unilateral: Secondary | ICD-10-CM | POA: Diagnosis not present

## 2017-09-12 DIAGNOSIS — Z85818 Personal history of malignant neoplasm of other sites of lip, oral cavity, and pharynx: Secondary | ICD-10-CM | POA: Diagnosis not present

## 2017-09-12 DIAGNOSIS — R0989 Other specified symptoms and signs involving the circulatory and respiratory systems: Secondary | ICD-10-CM | POA: Diagnosis not present

## 2017-09-12 DIAGNOSIS — C770 Secondary and unspecified malignant neoplasm of lymph nodes of head, face and neck: Secondary | ICD-10-CM | POA: Diagnosis not present

## 2017-09-14 DIAGNOSIS — R490 Dysphonia: Secondary | ICD-10-CM | POA: Diagnosis not present

## 2017-09-14 DIAGNOSIS — Z923 Personal history of irradiation: Secondary | ICD-10-CM | POA: Diagnosis not present

## 2017-09-14 DIAGNOSIS — C099 Malignant neoplasm of tonsil, unspecified: Secondary | ICD-10-CM | POA: Diagnosis not present

## 2017-09-14 DIAGNOSIS — J3801 Paralysis of vocal cords and larynx, unilateral: Secondary | ICD-10-CM | POA: Diagnosis not present

## 2017-09-14 DIAGNOSIS — C3412 Malignant neoplasm of upper lobe, left bronchus or lung: Secondary | ICD-10-CM | POA: Diagnosis not present

## 2017-09-14 DIAGNOSIS — Z85818 Personal history of malignant neoplasm of other sites of lip, oral cavity, and pharynx: Secondary | ICD-10-CM | POA: Diagnosis not present

## 2017-09-14 DIAGNOSIS — C770 Secondary and unspecified malignant neoplasm of lymph nodes of head, face and neck: Secondary | ICD-10-CM | POA: Diagnosis not present

## 2017-09-14 DIAGNOSIS — R9389 Abnormal findings on diagnostic imaging of other specified body structures: Secondary | ICD-10-CM | POA: Diagnosis not present

## 2017-09-14 DIAGNOSIS — R918 Other nonspecific abnormal finding of lung field: Secondary | ICD-10-CM | POA: Diagnosis not present

## 2017-09-14 DIAGNOSIS — R590 Localized enlarged lymph nodes: Secondary | ICD-10-CM | POA: Diagnosis not present

## 2017-09-14 DIAGNOSIS — R1314 Dysphagia, pharyngoesophageal phase: Secondary | ICD-10-CM | POA: Diagnosis not present

## 2017-09-14 DIAGNOSIS — Z85819 Personal history of malignant neoplasm of unspecified site of lip, oral cavity, and pharynx: Secondary | ICD-10-CM | POA: Diagnosis not present

## 2017-09-22 DIAGNOSIS — R1313 Dysphagia, pharyngeal phase: Secondary | ICD-10-CM | POA: Diagnosis not present

## 2017-09-22 DIAGNOSIS — R1314 Dysphagia, pharyngoesophageal phase: Secondary | ICD-10-CM | POA: Diagnosis not present

## 2017-09-28 DIAGNOSIS — J1289 Other viral pneumonia: Secondary | ICD-10-CM | POA: Diagnosis not present

## 2017-09-28 DIAGNOSIS — C099 Malignant neoplasm of tonsil, unspecified: Secondary | ICD-10-CM | POA: Diagnosis not present

## 2017-09-28 DIAGNOSIS — B977 Papillomavirus as the cause of diseases classified elsewhere: Secondary | ICD-10-CM | POA: Diagnosis not present

## 2017-09-28 DIAGNOSIS — C969 Malignant neoplasm of lymphoid, hematopoietic and related tissue, unspecified: Secondary | ICD-10-CM | POA: Diagnosis not present

## 2017-09-28 DIAGNOSIS — Z86008 Personal history of in-situ neoplasm of other site: Secondary | ICD-10-CM | POA: Diagnosis not present

## 2017-09-28 DIAGNOSIS — Z9221 Personal history of antineoplastic chemotherapy: Secondary | ICD-10-CM | POA: Diagnosis not present

## 2017-09-28 DIAGNOSIS — C7802 Secondary malignant neoplasm of left lung: Secondary | ICD-10-CM | POA: Diagnosis not present

## 2017-09-28 DIAGNOSIS — C771 Secondary and unspecified malignant neoplasm of intrathoracic lymph nodes: Secondary | ICD-10-CM | POA: Diagnosis not present

## 2017-09-28 DIAGNOSIS — C3402 Malignant neoplasm of left main bronchus: Secondary | ICD-10-CM | POA: Diagnosis not present

## 2017-09-28 DIAGNOSIS — R0989 Other specified symptoms and signs involving the circulatory and respiratory systems: Secondary | ICD-10-CM | POA: Diagnosis not present

## 2017-09-28 DIAGNOSIS — R846 Abnormal cytological findings in specimens from respiratory organs and thorax: Secondary | ICD-10-CM | POA: Diagnosis not present

## 2017-09-28 DIAGNOSIS — R918 Other nonspecific abnormal finding of lung field: Secondary | ICD-10-CM | POA: Diagnosis not present

## 2017-09-28 DIAGNOSIS — Z79899 Other long term (current) drug therapy: Secondary | ICD-10-CM | POA: Diagnosis not present

## 2017-09-28 DIAGNOSIS — J9809 Other diseases of bronchus, not elsewhere classified: Secondary | ICD-10-CM | POA: Diagnosis not present

## 2017-10-12 DIAGNOSIS — R918 Other nonspecific abnormal finding of lung field: Secondary | ICD-10-CM | POA: Diagnosis not present

## 2017-10-12 DIAGNOSIS — C770 Secondary and unspecified malignant neoplasm of lymph nodes of head, face and neck: Secondary | ICD-10-CM | POA: Diagnosis not present

## 2017-10-12 DIAGNOSIS — C099 Malignant neoplasm of tonsil, unspecified: Secondary | ICD-10-CM | POA: Diagnosis not present

## 2017-10-12 DIAGNOSIS — Z923 Personal history of irradiation: Secondary | ICD-10-CM | POA: Diagnosis not present

## 2017-10-12 DIAGNOSIS — R9389 Abnormal findings on diagnostic imaging of other specified body structures: Secondary | ICD-10-CM | POA: Diagnosis not present

## 2017-10-13 DIAGNOSIS — C770 Secondary and unspecified malignant neoplasm of lymph nodes of head, face and neck: Secondary | ICD-10-CM | POA: Diagnosis not present

## 2017-10-13 DIAGNOSIS — C3412 Malignant neoplasm of upper lobe, left bronchus or lung: Secondary | ICD-10-CM | POA: Diagnosis not present

## 2017-10-13 DIAGNOSIS — C099 Malignant neoplasm of tonsil, unspecified: Secondary | ICD-10-CM | POA: Diagnosis not present

## 2017-10-25 DIAGNOSIS — C024 Malignant neoplasm of lingual tonsil: Secondary | ICD-10-CM | POA: Diagnosis not present

## 2017-10-25 DIAGNOSIS — J668 Airway disease due to other specific organic dusts: Secondary | ICD-10-CM | POA: Diagnosis not present

## 2017-10-25 DIAGNOSIS — C3490 Malignant neoplasm of unspecified part of unspecified bronchus or lung: Secondary | ICD-10-CM | POA: Diagnosis not present

## 2017-10-26 DIAGNOSIS — C140 Malignant neoplasm of pharynx, unspecified: Secondary | ICD-10-CM | POA: Diagnosis not present

## 2017-10-26 DIAGNOSIS — C770 Secondary and unspecified malignant neoplasm of lymph nodes of head, face and neck: Secondary | ICD-10-CM | POA: Diagnosis not present

## 2017-10-26 DIAGNOSIS — C099 Malignant neoplasm of tonsil, unspecified: Secondary | ICD-10-CM | POA: Diagnosis not present

## 2017-10-26 DIAGNOSIS — C3412 Malignant neoplasm of upper lobe, left bronchus or lung: Secondary | ICD-10-CM | POA: Diagnosis not present

## 2017-10-26 DIAGNOSIS — Z923 Personal history of irradiation: Secondary | ICD-10-CM | POA: Diagnosis not present

## 2017-11-02 DIAGNOSIS — Z923 Personal history of irradiation: Secondary | ICD-10-CM | POA: Diagnosis not present

## 2017-11-02 DIAGNOSIS — C3412 Malignant neoplasm of upper lobe, left bronchus or lung: Secondary | ICD-10-CM | POA: Diagnosis not present

## 2017-11-02 DIAGNOSIS — C099 Malignant neoplasm of tonsil, unspecified: Secondary | ICD-10-CM | POA: Diagnosis not present

## 2017-11-02 DIAGNOSIS — C140 Malignant neoplasm of pharynx, unspecified: Secondary | ICD-10-CM | POA: Diagnosis not present

## 2017-11-02 DIAGNOSIS — Z5111 Encounter for antineoplastic chemotherapy: Secondary | ICD-10-CM | POA: Diagnosis not present

## 2017-11-02 DIAGNOSIS — Z5112 Encounter for antineoplastic immunotherapy: Secondary | ICD-10-CM | POA: Diagnosis not present

## 2017-11-02 DIAGNOSIS — C770 Secondary and unspecified malignant neoplasm of lymph nodes of head, face and neck: Secondary | ICD-10-CM | POA: Diagnosis not present

## 2017-11-06 DIAGNOSIS — C024 Malignant neoplasm of lingual tonsil: Secondary | ICD-10-CM | POA: Diagnosis not present

## 2017-11-06 DIAGNOSIS — C3490 Malignant neoplasm of unspecified part of unspecified bronchus or lung: Secondary | ICD-10-CM | POA: Diagnosis not present

## 2017-11-09 ENCOUNTER — Other Ambulatory Visit: Payer: Self-pay

## 2017-11-09 ENCOUNTER — Emergency Department (HOSPITAL_COMMUNITY): Payer: BLUE CROSS/BLUE SHIELD

## 2017-11-09 ENCOUNTER — Encounter (HOSPITAL_COMMUNITY): Payer: Self-pay

## 2017-11-09 ENCOUNTER — Emergency Department (HOSPITAL_COMMUNITY)
Admission: EM | Admit: 2017-11-09 | Discharge: 2017-11-09 | Disposition: A | Discharge status: Home / Self Care | Payer: BLUE CROSS/BLUE SHIELD | Source: Home / Self Care | Attending: Emergency Medicine | Admitting: Emergency Medicine

## 2017-11-09 DIAGNOSIS — R5081 Fever presenting with conditions classified elsewhere: Secondary | ICD-10-CM | POA: Diagnosis not present

## 2017-11-09 DIAGNOSIS — E860 Dehydration: Secondary | ICD-10-CM | POA: Insufficient documentation

## 2017-11-09 DIAGNOSIS — Z681 Body mass index (BMI) 19 or less, adult: Secondary | ICD-10-CM | POA: Diagnosis not present

## 2017-11-09 DIAGNOSIS — C7800 Secondary malignant neoplasm of unspecified lung: Secondary | ICD-10-CM | POA: Insufficient documentation

## 2017-11-09 DIAGNOSIS — D701 Agranulocytosis secondary to cancer chemotherapy: Secondary | ICD-10-CM | POA: Diagnosis not present

## 2017-11-09 DIAGNOSIS — Z87891 Personal history of nicotine dependence: Secondary | ICD-10-CM | POA: Diagnosis not present

## 2017-11-09 DIAGNOSIS — R1310 Dysphagia, unspecified: Secondary | ICD-10-CM | POA: Diagnosis not present

## 2017-11-09 DIAGNOSIS — Z8249 Family history of ischemic heart disease and other diseases of the circulatory system: Secondary | ICD-10-CM | POA: Diagnosis not present

## 2017-11-09 DIAGNOSIS — C799 Secondary malignant neoplasm of unspecified site: Secondary | ICD-10-CM | POA: Diagnosis not present

## 2017-11-09 DIAGNOSIS — Z79899 Other long term (current) drug therapy: Secondary | ICD-10-CM | POA: Insufficient documentation

## 2017-11-09 DIAGNOSIS — Z66 Do not resuscitate: Secondary | ICD-10-CM | POA: Diagnosis not present

## 2017-11-09 DIAGNOSIS — Z833 Family history of diabetes mellitus: Secondary | ICD-10-CM | POA: Diagnosis not present

## 2017-11-09 DIAGNOSIS — Z85818 Personal history of malignant neoplasm of other sites of lip, oral cavity, and pharynx: Secondary | ICD-10-CM | POA: Insufficient documentation

## 2017-11-09 DIAGNOSIS — D709 Neutropenia, unspecified: Secondary | ICD-10-CM | POA: Diagnosis not present

## 2017-11-09 DIAGNOSIS — D649 Anemia, unspecified: Secondary | ICD-10-CM | POA: Diagnosis not present

## 2017-11-09 DIAGNOSIS — T451X5A Adverse effect of antineoplastic and immunosuppressive drugs, initial encounter: Secondary | ICD-10-CM

## 2017-11-09 DIAGNOSIS — R509 Fever, unspecified: Secondary | ICD-10-CM | POA: Diagnosis not present

## 2017-11-09 DIAGNOSIS — E43 Unspecified severe protein-calorie malnutrition: Secondary | ICD-10-CM | POA: Diagnosis not present

## 2017-11-09 DIAGNOSIS — R0602 Shortness of breath: Secondary | ICD-10-CM | POA: Diagnosis not present

## 2017-11-09 DIAGNOSIS — R0789 Other chest pain: Secondary | ICD-10-CM | POA: Diagnosis not present

## 2017-11-09 DIAGNOSIS — C7801 Secondary malignant neoplasm of right lung: Secondary | ICD-10-CM | POA: Diagnosis not present

## 2017-11-09 DIAGNOSIS — C099 Malignant neoplasm of tonsil, unspecified: Secondary | ICD-10-CM | POA: Diagnosis not present

## 2017-11-09 DIAGNOSIS — R5383 Other fatigue: Secondary | ICD-10-CM | POA: Diagnosis not present

## 2017-11-09 LAB — COMPREHENSIVE METABOLIC PANEL
ALBUMIN: 3.3 g/dL — AB (ref 3.5–5.0)
ALK PHOS: 70 U/L (ref 38–126)
ALT: 38 U/L (ref 17–63)
AST: 39 U/L (ref 15–41)
Anion gap: 13 (ref 5–15)
BILIRUBIN TOTAL: 1.2 mg/dL (ref 0.3–1.2)
BUN: 41 mg/dL — AB (ref 6–20)
CALCIUM: 9.3 mg/dL (ref 8.9–10.3)
CO2: 27 mmol/L (ref 22–32)
Chloride: 94 mmol/L — ABNORMAL LOW (ref 101–111)
Creatinine, Ser: 0.8 mg/dL (ref 0.61–1.24)
GFR calc Af Amer: >60 mL/min (ref >60)
GFR calc non Af Amer: >60 mL/min (ref >60)
GLUCOSE: 135 mg/dL — AB (ref 65–99)
Potassium: 4.1 mmol/L (ref 3.5–5.1)
SODIUM: 134 mmol/L — AB (ref 135–145)
TOTAL PROTEIN: 7.6 g/dL (ref 6.5–8.1)

## 2017-11-09 LAB — CBC WITH DIFFERENTIAL/PLATELET
BASOS ABS: 0 10*3/uL (ref 0.0–0.1)
Basophils Relative: 0 %
Eosinophils Absolute: 0 10*3/uL (ref 0.0–0.7)
Eosinophils Relative: 0 %
HEMATOCRIT: 33.8 % — AB (ref 39.0–52.0)
Hemoglobin: 11.2 g/dL — ABNORMAL LOW (ref 13.0–17.0)
LYMPHS PCT: 13 %
Lymphs Abs: 0 10*3/uL — ABNORMAL LOW (ref 0.7–4.0)
MCH: 27.1 pg (ref 26.0–34.0)
MCHC: 33.1 g/dL (ref 30.0–36.0)
MCV: 81.8 fL (ref 78.0–100.0)
MONOS PCT: 80 %
Monocytes Absolute: 0.2 10*3/uL (ref 0.1–1.0)
NEUTROS PCT: 7 %
Neutro Abs: 0 10*3/uL — ABNORMAL LOW (ref 1.7–7.7)
Platelets: 161 10*3/uL (ref 150–400)
RBC: 4.13 MIL/uL — AB (ref 4.22–5.81)
RDW: 14.9 % (ref 11.5–15.5)
WBC: 0.2 10*3/uL — AB (ref 4.0–10.5)

## 2017-11-09 IMAGING — CR DG CHEST 2V
2 series · 2 of 2 positions shown · non-contrast
Comparison: None.

CLINICAL DATA: Pt reports hemoptysis starting around 230p. He also
reports green/gray "bit" coming up on his tongue. He has a hx of
tonsil cancer and metastatic lung cancer. He also endorses body
aches and fatigue. [88]. ex-smoker.

EXAM:
CHEST - 2 VIEW

[w chest lat]
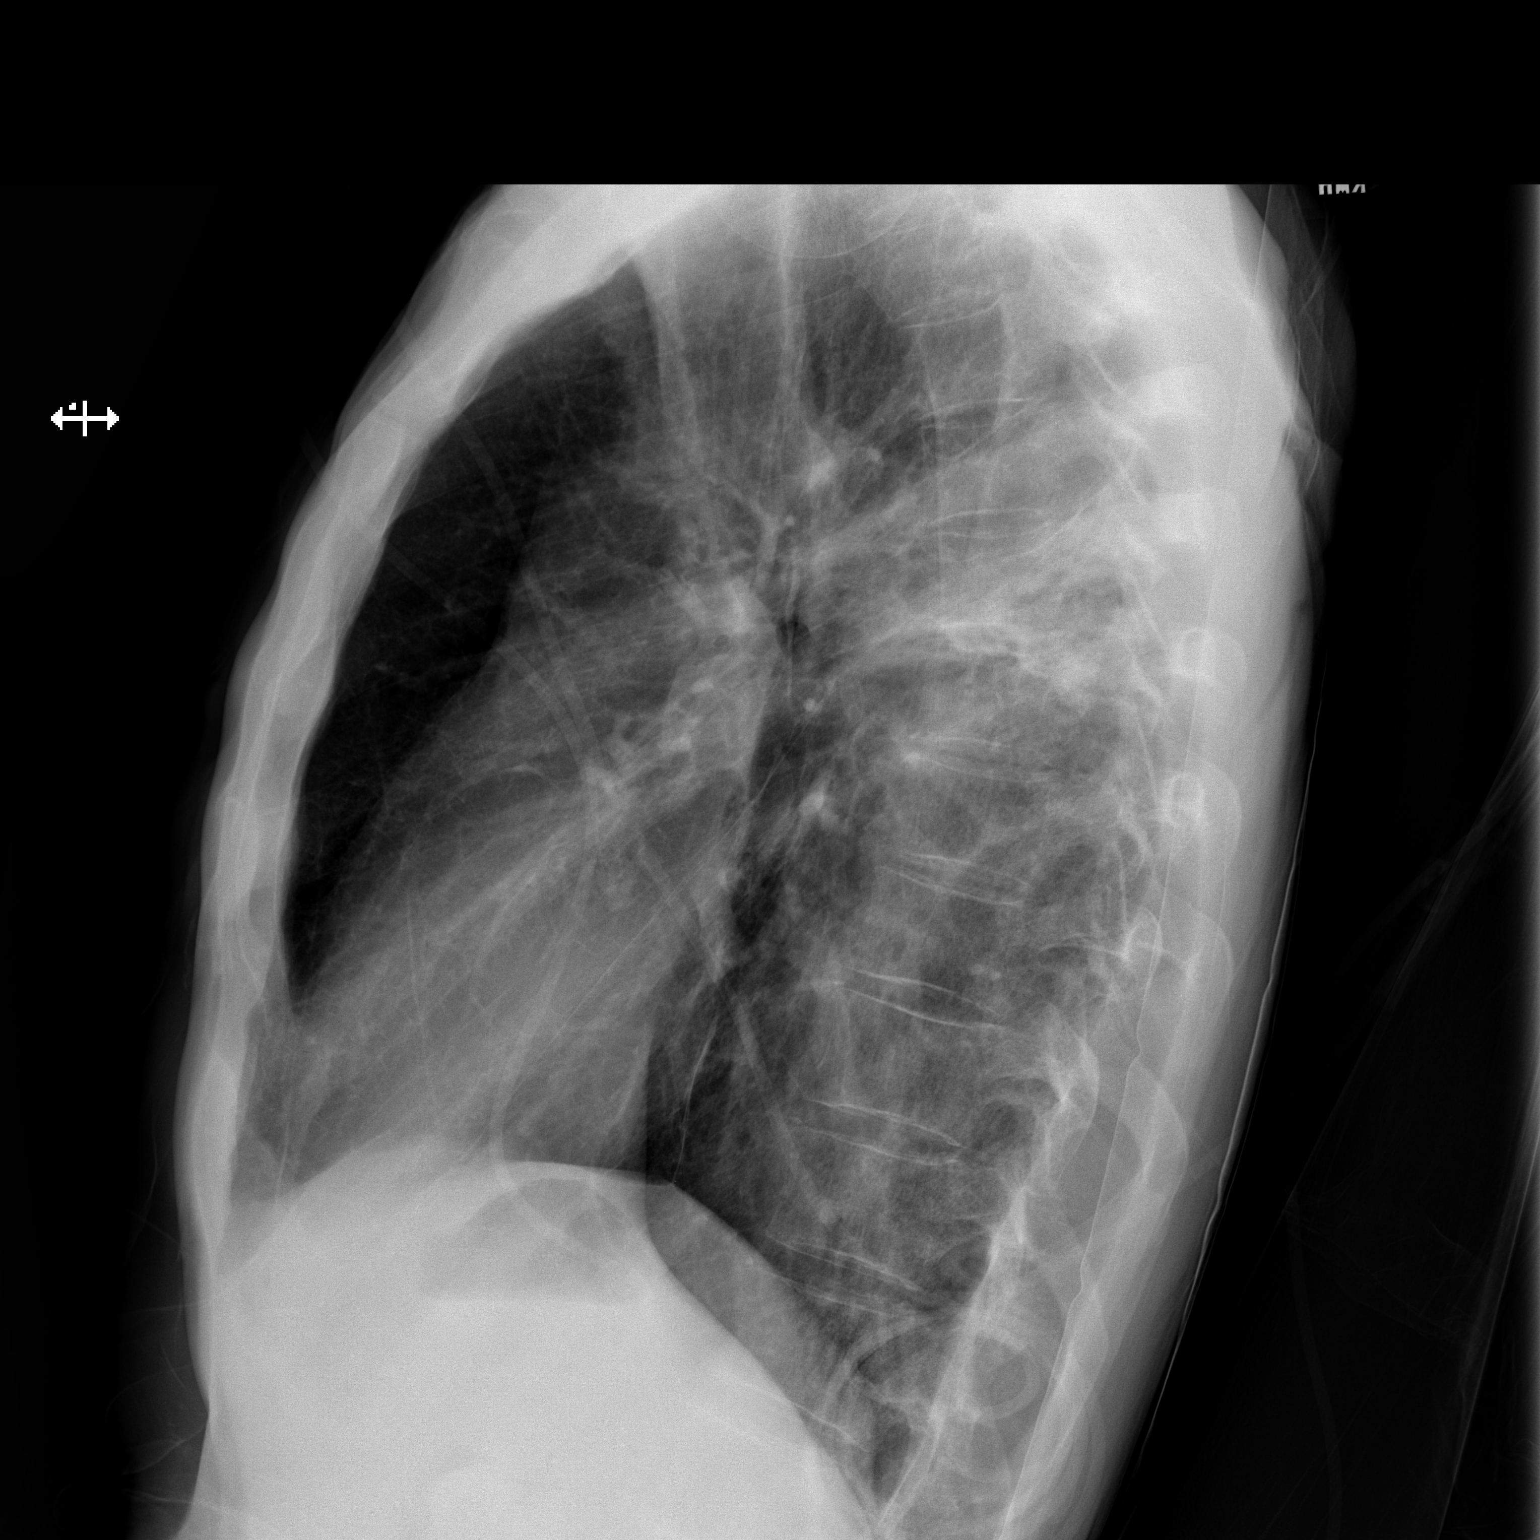

[chest ap]
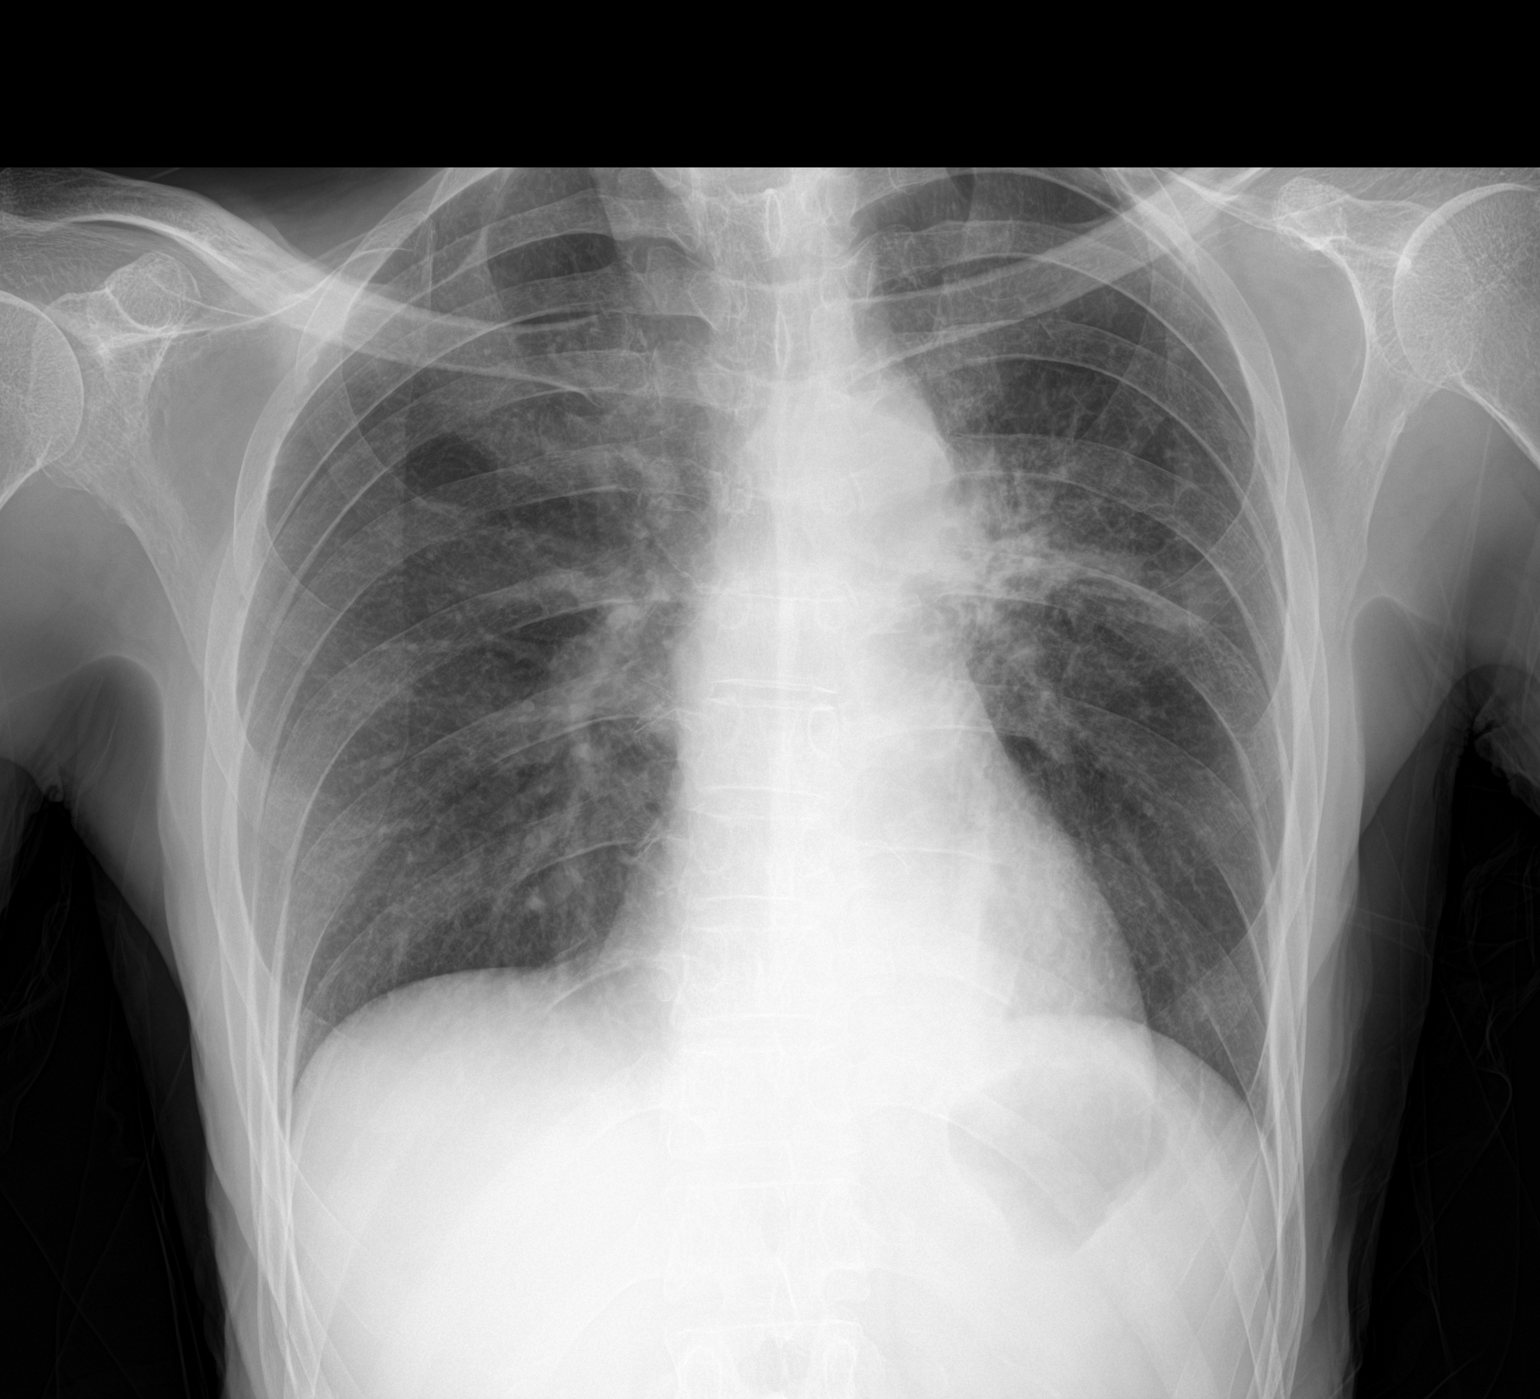

[2 of 2 positions shown; findings below may reference images not displayed]

FINDINGS: Heart size is normal. There is LEFT hilar fullness and parenchymal
opacity extending to the posterior LEFT UPPER lobe, suspicious for
mass and/or consolidation. No pulmonary edema. RIGHT lung is clear.
IMPRESSION: Suspect LEFT hilar mass and possible obstructive pneumonia or
atelectasis in the LEFT UPPER lobe.

Comparison prior studies would be helpful if they are available.

Consider CT of the chest as needed.

## 2017-11-09 MED ORDER — FLUCONAZOLE 40 MG/ML PO SUSR
200.0000 mg | Freq: Once | ORAL | Status: AC
  Administered 2017-11-09: 200 mg via ORAL
  Filled 2017-11-09: qty 5

## 2017-11-09 MED ORDER — FLUCONAZOLE 10 MG/ML PO SUSR: 100.0000 mg | Freq: Every day | ORAL | 0 refills | Status: AC

## 2017-11-09 MED ORDER — BENZOCAINE (TOPICAL) 20 % EX AERO: 1.0000 "application " | INHALATION_SPRAY | Freq: Four times a day (QID) | CUTANEOUS | 0 refills | Status: DC | PRN

## 2017-11-09 MED ORDER — AMOXICILLIN-POT CLAVULANATE 875-125 MG PO TABS: 1.0000 | ORAL_TABLET | Freq: Two times a day (BID) | ORAL | 0 refills | Status: DC

## 2017-11-09 MED ORDER — SODIUM CHLORIDE 0.9 % IV BOLUS
2000.0000 mL | Freq: Once | INTRAVENOUS | Status: AC
  Administered 2017-11-09: 2000 mL via INTRAVENOUS

## 2017-11-09 NOTE — ED Provider Notes (Signed)
[facility] DEPT Provider Note   CSN: [id number] Arrival date & time: 11/09/17  1705     History   Chief Complaint Chief Complaint  Patient presents with  . Hemoptysis  . Fatigue    HPI Bradley Oliver is a 62 y.o. male.  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 He presents today with significant throat pain, cough producing grey/green mucus and sore throat preventing any PO intake. No fever. No pain in his chest or SOB above his baseline. No vomiting or abdominal pain.   The history is provided by the patient. No language interpreter was used.    Past Medical History:  Diagnosis Date  . Blood in stool   . Cancer ([place]) 08/05/14   right tonsil, P16 positive    There are no active problems to display for this patient.   Past Surgical History:  Procedure Laterality Date  . EYE SURGERY    . KNEE SURGERY    . tonsil biopsy Right 08/05/14   invasive squamous cell carcinoma        Home Medications    Prior to Admission medications   Medication Sig Start Date End Date Taking? Authorizing Provider  albuterol (PROVENTIL HFA;VENTOLIN HFA) 108 (90 Base) MCG/ACT inhaler USE 2 PUFFS 4 TIMES A DAY AS NEEDED 08/09/17   [provider]  dexamethasone (DECADRON) 4 MG tablet TAKE 2 TABLETS AT 8AM 1 DAY BEFORE CHEMO, 2 TABS DAY AFTER CHEMO AND 1 TAB 2ND DAY AFTER CHEMO 10/26/17   [provider]  montelukast (SINGULAIR) 10 MG tablet TAKE 1 TABLET BY MOUTH EVERYDAY AT BEDTIME 08/21/17   [provider]  NOREL AD 4-10-325 MG TABS TAKE 1 TABLET TWICE A DAY AS NEEDED FOR COUGH AND CONGESTION 08/09/17   [provider]  prochlorperazine (COMPAZINE) 10 MG tablet TAKE 1 TABLET EVERY 6 HOURS AS NEEDED FOR NAUSEA OR VOMITING 10/26/17    [provider]  ranitidine (ZANTAC) 150 MG tablet Take by mouth. 09/12/14 09/12/15  [provider]  SODIUM FLUORIDE, DENTAL RINSE, (NEUTRAL SODIUM FLUORIDE) 0.2 % SOLN Place onto teeth. 03/20/15   [provider]  SYMBICORT 160-4.5 MCG/ACT inhaler USE 2 PUFFS TWICE A DAY 08/22/17   [provider]    Family History Family History  Problem Relation Age of Onset  . Hypertension Mother   . Diabetes Father   . Diabetes Paternal Grandmother   . Diabetes Paternal Grandfather     Social History Social History   Tobacco Use  . Smoking status: Former Research scientist (life sciences)  . Tobacco comment: smoked for short time as teenager  Substance Use Topics  . Alcohol use: Not on file  . Drug use: Not on file     Allergies   Patient has no known allergies.   Review of Systems Review of Systems  Constitutional: Negative for chills and fever.  HENT: Positive for sore throat and trouble swallowing. Negative for congestion.   Respiratory: Positive for cough, chest tightness and shortness of breath (is at baseline with h/o CA).   Cardiovascular: Negative.  Negative for chest pain.  Gastrointestinal: Negative.  Negative for abdominal pain, nausea and vomiting.  Musculoskeletal: Negative.   Skin: Negative.   Neurological: Negative for syncope.     Physical Exam Updated Vital Signs BP 124/82 (BP Location: Left  Arm)   Pulse (!) 114   Temp 98.9 F (37.2 C) (Oral)   Resp 17   SpO2 95%   Physical Exam  Constitutional: He is oriented to person, place, and time. He appears well-developed.  Chronically ill appearing, cachectic  HENT:  Head: Normocephalic.  Dry oral mucosa with thick, gray coating of tongue. Trismus (2 finger) with difficult visualization of oropharynx. No thrush observed. No neck swelling or mass. Patient handling own secretions.  Neck: Normal range of motion. Neck supple.  Cardiovascular: Regular rhythm. Tachycardia present.  No murmur  heard. Pulmonary/Chest: Effort normal and breath sounds normal. He has no wheezes. He has no rales. He exhibits no tenderness.  Abdominal: Soft. Bowel sounds are normal. There is no tenderness. There is no rebound and no guarding.  Musculoskeletal: Normal range of motion.  Neurological: He is alert and oriented to person, place, and time.  Skin: Skin is warm and dry. No rash noted.  Psychiatric: He has a normal mood and affect.     ED Treatments / Results  Labs (all labs ordered are listed, but only abnormal results are displayed) Labs Reviewed  CBC WITH DIFFERENTIAL/PLATELET  COMPREHENSIVE METABOLIC PANEL   Results for orders placed or performed during the hospital encounter of 11/09/17  CBC with Differential  Result Value Ref Range   WBC 0.2 (LL) 4.0 - 10.5 K/uL   RBC 4.13 (L) 4.22 - 5.81 MIL/uL   Hemoglobin 11.2 (L) 13.0 - 17.0 g/dL   HCT 33.8 (L) 39.0 - 52.0 %   MCV 81.8 78.0 - 100.0 fL   MCH 27.1 26.0 - 34.0 pg   MCHC 33.1 30.0 - 36.0 g/dL   RDW 14.9 11.5 - 15.5 %   Platelets 161 150 - 400 K/uL   Neutrophils Relative % 7 %   Lymphocytes Relative 13 %   Monocytes Relative 80 %   Eosinophils Relative 0 %   Basophils Relative 0 %   Neutro Abs 0.0 (L) 1.7 - 7.7 K/uL   Lymphs Abs 0.0 (L) 0.7 - 4.0 K/uL   Monocytes Absolute 0.2 0.1 - 1.0 K/uL   Eosinophils Absolute 0.0 0.0 - 0.7 K/uL   Basophils Absolute 0.0 0.0 - 0.1 K/uL   WBC Morphology WHITE COUNT CONFIRMED ON SMEAR   Comprehensive metabolic panel  Result Value Ref Range   Sodium 134 (L) 135 - 145 mmol/L   Potassium 4.1 3.5 - 5.1 mmol/L   Chloride 94 (L) 101 - 111 mmol/L   CO2 27 22 - 32 mmol/L   Glucose, Bld 135 (H) 65 - 99 mg/dL   BUN 41 (H) 6 - 20 mg/dL   Creatinine, Ser 0.80 0.61 - 1.24 mg/dL   Calcium 9.3 8.9 - 10.3 mg/dL   Total Protein 7.6 6.5 - 8.1 g/dL   Albumin 3.3 (L) 3.5 - 5.0 g/dL   AST 39 15 - 41 U/L   ALT 38 17 - 63 U/L   Alkaline Phosphatase 70 38 - 126 U/L   Total Bilirubin 1.2 0.3 - 1.2  mg/dL   GFR calc non Af Amer >60 >60 mL/min   GFR calc Af Amer >60 >60 mL/min   Anion gap 13 5 - 15    EKG None  Radiology No results found. Dg Chest 2 View  Result Date: 11/09/2017 CLINICAL DATA:  Pt reports hemoptysis starting around 230p. He also reports green/gray "bit" coming up on his tongue. He has a hx of tonsil cancer and metastatic lung cancer. He also endorses  body aches and fatigue. AANDOx4. ex-smoker. EXAM: CHEST - 2 VIEW COMPARISON:  None. FINDINGS: Heart size is normal. There is LEFT hilar fullness and parenchymal opacity extending to the posterior LEFT UPPER lobe, suspicious for mass and/or consolidation. No pulmonary edema. RIGHT lung is clear. IMPRESSION: Suspect LEFT hilar mass and possible obstructive pneumonia or atelectasis in the LEFT UPPER lobe. Comparison prior studies would be helpful if they are available. Consider CT of the chest as needed. Electronically Signed   By: Nolon Nations M.D.   On: 11/09/2017 18:47    Procedures Procedures (including critical care time)  Medications Ordered in ED Medications  sodium chloride 0.9 % bolus 2,000 mL (has no administration in time range)     Initial Impression / Assessment and Plan / ED Course  I have reviewed the triage vital signs and the nursing notes.  Pertinent labs & imaging results that were available during my care of the patient were reviewed by me and considered in my medical decision making (see chart for details).     Patient presents with throat pain causing difficulty with swallowing for the past 2-3 days. No fever. 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 Treated at [facility]. First chemo 11/02/17.   Chart reviewed, including [facility] records. CXR here showing left upper lobe and perihilar mass. This is known on review of [facility] records not available here for comparison. There is some question of an opacity that may reflect PNA. Given there has been no fever, change in cough, doubt PNA.    Labs c/w mild dehydration. Tachycardia improves with IVF's and patient feels much better. He is significantly neutropenic with WBC 0.20, with neutrophil count of 7. Last comparable 11/02/17, WBC 6.8.  Discussed the patient's presentation with [facility] on-call, Dr. Dennison Nancy, who was very helpful in developing care plan. The patient's neutropenia is of concern. She suggested empiric treatment with abx as well as anti-fungal. Will start on Diflucan and Augmentin. Loading dose Diflucan given here per pharmacy recommendation. Blood cultures x 2 also obtained prior to patient's discharge. He can go home tonight as he appears stable and admission is not warranted based solely on neutropenia per oncology. The patient is comfortable with discharge home. Discussed strict return precautions including development of fever to 100.4 for greater than a 3 hour period or 101 at any time.   He will follow up per scheduled appointment with [facility] unless he develops concerning symptoms.  Final Clinical Impressions(s) / ED Diagnoses   Final diagnoses:  None   1. Neutropenia 2. Odynophagia 3. History of cancer  ED Discharge Orders    None       Dennie Bible 11/09/17 2101    Daleen Bo, MD 11/13/17 1531

## 2017-11-09 NOTE — Discharge Instructions (Addendum)
Take medications as prescribed. Follow up with [facility] per next scheduled visit. It is important to return to the emergency department (here or at [facility]) if you develop a fever of 100.5 for any 3 hour period of time, or 101 at any time.

## 2017-11-09 NOTE — ED Triage Notes (Signed)
Pt reports hemoptysis starting around 230p. He also reports green/gray "bit" coming up on his tongue. He has a hx of tonsil cancer and metastatic lung cancer. He also endorses body aches and fatigue. A&Ox4.

## 2017-11-10 ENCOUNTER — Encounter (HOSPITAL_COMMUNITY): Payer: Self-pay | Admitting: *Deleted

## 2017-11-10 ENCOUNTER — Inpatient Hospital Stay (HOSPITAL_COMMUNITY)
Admission: EM | Admit: 2017-11-10 | Discharge: 2017-11-15 | DRG: 808 | Disposition: A | Discharge status: Home / Self Care | Payer: BLUE CROSS/BLUE SHIELD | Attending: Internal Medicine | Admitting: Internal Medicine

## 2017-11-10 DIAGNOSIS — D701 Agranulocytosis secondary to cancer chemotherapy: Principal | ICD-10-CM | POA: Diagnosis present

## 2017-11-10 DIAGNOSIS — Z833 Family history of diabetes mellitus: Secondary | ICD-10-CM

## 2017-11-10 DIAGNOSIS — Z681 Body mass index (BMI) 19 or less, adult: Secondary | ICD-10-CM

## 2017-11-10 DIAGNOSIS — R5081 Fever presenting with conditions classified elsewhere: Secondary | ICD-10-CM

## 2017-11-10 DIAGNOSIS — T451X5A Adverse effect of antineoplastic and immunosuppressive drugs, initial encounter: Secondary | ICD-10-CM | POA: Diagnosis present

## 2017-11-10 DIAGNOSIS — D709 Neutropenia, unspecified: Secondary | ICD-10-CM | POA: Diagnosis present

## 2017-11-10 DIAGNOSIS — Z87891 Personal history of nicotine dependence: Secondary | ICD-10-CM

## 2017-11-10 DIAGNOSIS — R509 Fever, unspecified: Secondary | ICD-10-CM | POA: Diagnosis not present

## 2017-11-10 DIAGNOSIS — Z8249 Family history of ischemic heart disease and other diseases of the circulatory system: Secondary | ICD-10-CM

## 2017-11-10 DIAGNOSIS — D649 Anemia, unspecified: Secondary | ICD-10-CM | POA: Diagnosis present

## 2017-11-10 DIAGNOSIS — C099 Malignant neoplasm of tonsil, unspecified: Secondary | ICD-10-CM | POA: Diagnosis not present

## 2017-11-10 DIAGNOSIS — C7801 Secondary malignant neoplasm of right lung: Secondary | ICD-10-CM | POA: Diagnosis present

## 2017-11-10 DIAGNOSIS — E43 Unspecified severe protein-calorie malnutrition: Secondary | ICD-10-CM

## 2017-11-10 DIAGNOSIS — C799 Secondary malignant neoplasm of unspecified site: Secondary | ICD-10-CM | POA: Diagnosis not present

## 2017-11-10 DIAGNOSIS — Z66 Do not resuscitate: Secondary | ICD-10-CM | POA: Diagnosis present

## 2017-11-10 DIAGNOSIS — R1310 Dysphagia, unspecified: Secondary | ICD-10-CM | POA: Diagnosis present

## 2017-11-10 DIAGNOSIS — R0602 Shortness of breath: Secondary | ICD-10-CM | POA: Diagnosis not present

## 2017-11-10 LAB — CBC WITH DIFFERENTIAL/PLATELET
BASOS PCT: 0 %
Basophils Absolute: 0 10*3/uL (ref 0.0–0.1)
EOS PCT: 0 %
Eosinophils Absolute: 0 10*3/uL (ref 0.0–0.7)
HEMATOCRIT: 29.9 % — AB (ref 39.0–52.0)
Hemoglobin: 9.7 g/dL — ABNORMAL LOW (ref 13.0–17.0)
LYMPHS ABS: 0.1 10*3/uL — AB (ref 0.7–4.0)
Lymphocytes Relative: 17 %
MCH: 27.4 pg (ref 26.0–34.0)
MCHC: 32.4 g/dL (ref 30.0–36.0)
MCV: 84.5 fL (ref 78.0–100.0)
MONO ABS: 0.1 10*3/uL (ref 0.1–1.0)
MONOS PCT: 37 %
Neutro Abs: 0.2 10*3/uL — ABNORMAL LOW (ref 1.7–7.7)
Neutrophils Relative %: 46 %
PLATELETS: 129 10*3/uL — AB (ref 150–400)
RBC: 3.54 MIL/uL — ABNORMAL LOW (ref 4.22–5.81)
RDW: 15.1 % (ref 11.5–15.5)
WBC: 0.4 10*3/uL — CL (ref 4.0–10.5)

## 2017-11-10 LAB — BASIC METABOLIC PANEL
Anion gap: 13 (ref 5–15)
BUN: 28 mg/dL — AB (ref 6–20)
CHLORIDE: 99 mmol/L — AB (ref 101–111)
CO2: 26 mmol/L (ref 22–32)
CREATININE: 0.73 mg/dL (ref 0.61–1.24)
Calcium: 8.8 mg/dL — ABNORMAL LOW (ref 8.9–10.3)
GFR calc Af Amer: >60 mL/min (ref >60)
GFR calc non Af Amer: >60 mL/min (ref >60)
GLUCOSE: 100 mg/dL — AB (ref 65–99)
POTASSIUM: 3.6 mmol/L (ref 3.5–5.1)
SODIUM: 138 mmol/L (ref 135–145)

## 2017-11-10 LAB — I-STAT CG4 LACTIC ACID, ED: Lactic Acid, Venous: 0.95 mmol/L (ref 0.5–1.9)

## 2017-11-10 MED ORDER — ENOXAPARIN SODIUM 40 MG/0.4ML ~~LOC~~ SOLN
40.0000 mg | SUBCUTANEOUS | Status: DC
  Administered 2017-11-10 – 2017-11-14 (×5): 40 mg via SUBCUTANEOUS
  Filled 2017-11-10 (×5): qty 0.4

## 2017-11-10 MED ORDER — SODIUM CHLORIDE 0.9 % IV SOLN
2.0000 g | Freq: Once | INTRAVENOUS | Status: AC
  Administered 2017-11-10: 2 g via INTRAVENOUS
  Filled 2017-11-10: qty 2

## 2017-11-10 MED ORDER — SODIUM CHLORIDE 0.9 % IV BOLUS
1000.0000 mL | Freq: Once | INTRAVENOUS | Status: AC
Start: 2017-11-10 — End: 2017-11-10
  Administered 2017-11-10: 1000 mL via INTRAVENOUS

## 2017-11-10 MED ORDER — ONDANSETRON HCL 4 MG/2ML IJ SOLN
4.0000 mg | Freq: Four times a day (QID) | INTRAMUSCULAR | Status: DC | PRN
  Filled 2017-11-10: qty 2

## 2017-11-10 MED ORDER — FLUCONAZOLE 40 MG/ML PO SUSR
100.0000 mg | Freq: Every day | ORAL | Status: DC
  Administered 2017-11-10 – 2017-11-15 (×6): 100 mg via ORAL
  Filled 2017-11-10 (×7): qty 2.5

## 2017-11-10 MED ORDER — BENZOCAINE (TOPICAL) 20 % EX AERO
1.0000 "application " | INHALATION_SPRAY | Freq: Four times a day (QID) | CUTANEOUS | Status: DC | PRN
  Administered 2017-11-10: 1 via OROMUCOSAL
  Filled 2017-11-10 (×2): qty 57

## 2017-11-10 MED ORDER — SODIUM CHLORIDE 0.9 % IV SOLN
2.0000 g | Freq: Three times a day (TID) | INTRAVENOUS | Status: DC
  Administered 2017-11-10 – 2017-11-15 (×14): 2 g via INTRAVENOUS
  Filled 2017-11-10 (×15): qty 2

## 2017-11-10 MED ORDER — SODIUM CHLORIDE 0.9 % IV SOLN
INTRAVENOUS | Status: DC
  Administered 2017-11-10 – 2017-11-14 (×3): via INTRAVENOUS

## 2017-11-10 MED ORDER — ONDANSETRON HCL 4 MG PO TABS: 4.0000 mg | ORAL_TABLET | Freq: Four times a day (QID) | ORAL | Status: DC | PRN

## 2017-11-10 MED ORDER — ACETAMINOPHEN 650 MG RE SUPP: 650.0000 mg | Freq: Four times a day (QID) | RECTAL | Status: DC | PRN

## 2017-11-10 MED ORDER — ACETAMINOPHEN 325 MG PO TABS
650.0000 mg | ORAL_TABLET | Freq: Four times a day (QID) | ORAL | Status: DC | PRN
  Administered 2017-11-10 – 2017-11-13 (×2): 650 mg via ORAL
  Filled 2017-11-10: qty 2

## 2017-11-10 NOTE — ED Notes (Signed)
Bed: MM76 Expected date:  Expected time:  Means of arrival:  Comments: For triage 4

## 2017-11-10 NOTE — H&P (Signed)
History and Physical    Bradley Oliver. Bradley Oliver:[id number] DOB: [birth year] DOA: 11/10/2017  PCP: Patient, No Pcp Per  Patient coming from: Home  Chief Complaint: Fever, throat pain, cough with grey/green sputum   HPI: Bradley Oliver is a 62 y.o. male with medical history significant of metastatic tonsil squamous cell carcinoma who presents with fever, throat pain, cough with green/gray sputum.  He presented to the emergency department yesterday 6/20.  At that time, he had no fevers.  Case was discussed by EDP with [place] oncology.  They recommended starting patient on Augmentin and Diflucan and to monitor for fevers at home due to his neutropenia.  He has not yet filled those prescriptions. He now returns to the emergency department today after having a fever at home of 102.  He denies any chest pain, abdominal pain, nausea, vomiting, diarrhea.  He is currently undergoing chemotherapy at [facility].  ED Course: Labs obtained which revealed neutropenia with ANC 200.  Blood cultures obtained.  EDP spoke with [facility] oncology on-call who recommended IV antibiotics, Diflucan swish and swallow and culturing throat.  Review of Systems: As per HPI otherwise 10 point review of systems negative.   Past Medical History:  Diagnosis Date  . Blood in stool   . Cancer ([place]) 08/05/14   right tonsil, P16 positive    Past Surgical History:  Procedure Laterality Date  . EYE SURGERY    . KNEE SURGERY    . tonsil biopsy Right 08/05/14   invasive squamous cell carcinoma     reports that he has quit smoking. He does not have any smokeless tobacco history on file. His alcohol and drug histories are not on file.  No Known Allergies  Family History  Problem Relation Age of Onset  . Hypertension Mother   . Diabetes Father   . Diabetes Paternal Grandmother   . Diabetes Paternal Grandfather     Prior to Admission medications   Medication Sig Start Date End Date Taking? Authorizing Provider  Multiple  Vitamins-Minerals (MULTIVITAMIN ADULT) TABS Take 1 tablet by mouth daily.   Yes [provider]  albuterol (PROVENTIL HFA;VENTOLIN HFA) 108 (90 Base) MCG/ACT inhaler USE 2 PUFFS 4 TIMES A DAY AS NEEDED SOB AND WHEEZING 08/09/17   [provider]  amoxicillin-clavulanate (AUGMENTIN) 875-125 MG tablet Take 1 tablet by mouth every 12 (twelve) hours. 11/09/17   Charlann Lange, PA-C  benzocaine (HURRICAINE) 20 % oral spray Use as directed 1 application in the mouth or throat 4 (four) times daily as needed for throat irritation / pain. 11/09/17   Charlann Lange, PA-C  dexamethasone (DECADRON) 4 MG tablet TAKE 2 TABLETS AT 8AM 1 DAY BEFORE CHEMO, 2 TABS DAY AFTER CHEMO AND 1 TAB 2ND DAY AFTER CHEMO 10/26/17   [provider]  fluconazole (DIFLUCAN) 10 MG/ML suspension Take 10 mLs (100 mg total) by mouth daily for 7 days. 11/09/17 11/16/17  Charlann Lange, PA-C  prochlorperazine (COMPAZINE) 10 MG tablet TAKE 1 TABLET EVERY 6 HOURS AS NEEDED FOR NAUSEA OR VOMITING 10/26/17   [provider]  ranitidine (ZANTAC) 150 MG tablet Take by mouth. 09/12/14 09/12/15  [provider]    Physical Exam: Vitals:   11/10/17 1146 11/10/17 1305  BP: 112/77 112/75  Pulse: (!) 103 90  Resp: 18 (!) 21  Temp: 99.9 F (37.7 C)   TempSrc: Oral   SpO2: 95% 97%  Weight: 49.9 kg (110 lb)   Height: 5\' 10"  (1.778 m)  Constitutional: NAD, calm, comfortable, thin and cachectic Eyes: PERRL, lids and conjunctivae normal ENMT: Mucous membranes are moist. Posterior pharynx with green and white/gray exudate in pharynx and tongue.  Voice is hoarse Neck: normal, supple, no masses, no thyromegaly Respiratory: Coarse breath sounds bilaterally. Normal respiratory effort. No accessory muscle use.  Cardiovascular: Regular rate and rhythm, no murmurs / rubs / gallops. No extremity edema.  Abdomen: no tenderness, no masses palpated. No hepatosplenomegaly. Bowel sounds positive.  Musculoskeletal: no  clubbing / cyanosis. No joint deformity upper and lower extremities. Good ROM, no contractures. Normal muscle tone.  Skin: no rashes, lesions, ulcers. No induration Neurologic: CN 2-12 grossly intact. Strength 5/5 in all 4.  Psychiatric: Normal judgment and insight. Alert and oriented x 3. Normal mood.   Labs on Admission: I have personally reviewed following labs and imaging studies  CBC: Recent Labs  Lab 11/09/17 1828 11/10/17 1306  WBC 0.2* 0.4*  NEUTROABS 0.0* 0.2*  HGB 11.2* 9.7*  HCT 33.8* 29.9*  MCV 81.8 84.5  PLT 161 144*   Basic Metabolic Panel: Recent Labs  Lab 11/09/17 1828 11/10/17 1306  NA 134* 138  K 4.1 3.6  CL 94* 99*  CO2 27 26  GLUCOSE 135* 100*  BUN 41* 28*  CREATININE 0.80 0.73  CALCIUM 9.3 8.8*   GFR: Estimated Creatinine Clearance: 68.4 mL/min (by C-G formula based on SCr of 0.73 mg/dL). Liver Function Tests: Recent Labs  Lab 11/09/17 1828  AST 39  ALT 38  ALKPHOS 70  BILITOT 1.2  PROT 7.6  ALBUMIN 3.3*   No results for input(s): LIPASE, AMYLASE in the last 168 hours. No results for input(s): AMMONIA in the last 168 hours. Coagulation Profile: No results for input(s): INR, PROTIME in the last 168 hours. Cardiac Enzymes: No results for input(s): CKTOTAL, CKMB, CKMBINDEX, TROPONINI in the last 168 hours. BNP (last 3 results) No results for input(s): PROBNP in the last 8760 hours. HbA1C: No results for input(s): HGBA1C in the last 72 hours. CBG: No results for input(s): GLUCAP in the last 168 hours. Lipid Profile: No results for input(s): CHOL, HDL, LDLCALC, TRIG, CHOLHDL, LDLDIRECT in the last 72 hours. Thyroid Function Tests: No results for input(s): TSH, T4TOTAL, FREET4, T3FREE, THYROIDAB in the last 72 hours. Anemia Panel: No results for input(s): VITAMINB12, FOLATE, FERRITIN, TIBC, IRON, RETICCTPCT in the last 72 hours. Urine analysis: No results found for: COLORURINE, APPEARANCEUR, LABSPEC, PHURINE, GLUCOSEU, HGBUR,  BILIRUBINUR, KETONESUR, PROTEINUR, UROBILINOGEN, NITRITE, LEUKOCYTESUR Sepsis Labs: !!!!!!!!!!!!!!!!!!!!!!!!!!!!!!!!!!!!!!!!!!!! @LABRCNTIP (procalcitonin:4,lacticidven:4) ) Recent Results (from the past 240 hour(s))  Culture, blood (routine x 2)     Status: None (Preliminary result)   Collection Time: 11/09/17  9:06 PM  Result Value Ref Range Status   Specimen Description   Final    BLOOD RIGHT ANTECUBITAL Performed at [facility] [address] [place], [place] [address]    Special Requests   Final    BOTTLES DRAWN AEROBIC AND ANAEROBIC Blood Culture results may not be optimal due to an excessive volume of blood received in culture bottles Performed at [facility] [address], [place], [place] [address]    Culture   Final    NO GROWTH < 12 HOURS Performed at [facility] [address] [place], [place] [address]    Report Status PENDING  Incomplete  Culture, blood (routine x 2)     Status: None (Preliminary result)   Collection Time: 11/09/17  9:06 PM  Result Value Ref  Range Status   Specimen Description   Final    BLOOD BLOOD LEFT WRIST Performed at [facility] [address], [place], [place] [address]    Special Requests   Final    BOTTLES DRAWN AEROBIC AND ANAEROBIC Blood Culture adequate volume Performed at [facility] [address], [place], [place] [address]    Culture   Final    NO GROWTH < 12 HOURS Performed at [facility] [address], [place], [place] [address]    Report Status PENDING  Incomplete     Radiological Exams on Admission: Dg Chest 2 View  Result Date: 11/09/2017 CLINICAL DATA:  Pt reports hemoptysis starting around 230p. He also reports green/gray "bit" coming up on his tongue. He has a hx of tonsil cancer and metastatic lung cancer. He also endorses body aches and fatigue. AANDOx4. ex-smoker. EXAM: CHEST - 2 VIEW COMPARISON:  None. FINDINGS:  Heart size is normal. There is LEFT hilar fullness and parenchymal opacity extending to the posterior LEFT UPPER lobe, suspicious for mass and/or consolidation. No pulmonary edema. RIGHT lung is clear. IMPRESSION: Suspect LEFT hilar mass and possible obstructive pneumonia or atelectasis in the LEFT UPPER lobe. Comparison prior studies would be helpful if they are available. Consider CT of the chest as needed. Electronically Signed   By: Nolon Nations M.D.   On: 11/09/2017 18:47    Assessment/Plan Principal Problem:   Neutropenic fever ([place]) Active Problems:   Squamous cell carcinoma of right tonsil (HCC)   Neutropenic fever -CXR 6/20: Suspect LEFT hilar mass and possible obstructive pneumonia or atelectasis in the LEFT UPPER lobe. 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  -Follows with [facility] oncology, currently undergoing chemotherapy  -Follow up scheduled for July    DVT prophylaxis: Lovenox Code Status: DNR Family Communication: Family at bedside Disposition Plan: [facility] called: [facility] oncology called by EDP  Admission status: Inpatient   * I certify that at the point of admission it is my clinical judgment that the patient will require inpatient hospital care spanning beyond 2 midnights from the point of admission due to high intensity of service, high risk for further deterioration and high frequency of surveillance required.*   Dessa Phi, DO [facility] [email or web address] Password [facility] 11/10/2017, 4:41 PM

## 2017-11-10 NOTE — ED Triage Notes (Signed)
Pt was seen here yesterday and was given parameters when to return, he developed temperature of 101.9 so he cam back. He is a cancer pt. Last chemo was on Friday.

## 2017-11-10 NOTE — ED Notes (Signed)
Date and time results received: 11/10/17 1357 (use smartphrase ".now" to insert current time)  Test: WBC Critical Value: 0.4  Name of Provider Notified: Melina Copa Orders Received? Or Actions Taken?: Actions Taken: Arts development officer and EDP notified

## 2017-11-10 NOTE — ED Notes (Signed)
ED TO INPATIENT HANDOFF REPORT  Name/Age/Gender Bradley Oliver 62 y.o. male  Code Status   Home/SNF/Other Home  Chief Complaint fever/body pain  Level of Care/Admitting Diagnosis ED Disposition    ED Disposition Condition [facility] Area: [place] [[id number]]  Level of Care: Med-Surg [16]  Diagnosis: Neutropenic fever [facility]) [[id number]]  Admitting Physician: Dessa Phi [[id number]]  Attending Physician: Dessa Phi [id number]  Estimated length of stay: past midnight tomorrow  Certification:: I certify this patient will need inpatient services for at least 2 midnights  PT Class (Do Not Modify): Inpatient [101]  PT Acc Code (Do Not Modify): Private [1]       Medical History Past Medical History:  Diagnosis Date  . Blood in stool   . Cancer ([place]) 08/05/14   right tonsil, P16 positive    Allergies No Known Allergies  IV Location/Drains/Wounds Patient Lines/Drains/Airways Status   Active Line/Drains/Airways    Name:   Placement date:   Placement time:   Site:   Days:   Peripheral IV 11/10/17 Right Forearm   11/10/17    1338    Forearm   less than 1          Labs/Imaging Results for orders placed or performed during the hospital encounter of 11/10/17 (from the past 48 hour(s))  Basic metabolic panel     Status: Abnormal   Collection Time: 11/10/17  1:06 PM  Result Value Ref Range   Sodium 138 135 - 145 mmol/L   Potassium 3.6 3.5 - 5.1 mmol/L   Chloride 99 (L) 101 - 111 mmol/L   CO2 26 22 - 32 mmol/L   Glucose, Bld 100 (H) 65 - 99 mg/dL   BUN 28 (H) 6 - 20 mg/dL   Creatinine, Ser 0.73 0.61 - 1.24 mg/dL   Calcium 8.8 (L) 8.9 - 10.3 mg/dL   GFR calc non Af Amer >60 >60 mL/min   GFR calc Af Amer >60 >60 mL/min    Comment: (NOTE) The eGFR has been calculated using the CKD EPI equation. This calculation has not been validated in all clinical situations. eGFR's persistently <60 mL/min signify possible Chronic  Kidney Disease.    Anion gap 13 5 - 15    Comment: Performed at [facility], [place] [address] [place], [place] [address]  CBC with Differential     Status: Abnormal   Collection Time: 11/10/17  1:06 PM  Result Value Ref Range   WBC 0.4 (LL) 4.0 - 10.5 K/uL    Comment: REPEATED TO VERIFY CRITICAL RESULT CALLED TO, READ BACK BY AND VERIFIED WITH: CLAPP,S. RN _0  ON 06.21.19 BY COHEN,K    RBC 3.54 (L) 4.22 - 5.81 MIL/uL   Hemoglobin 9.7 (L) 13.0 - 17.0 g/dL   HCT 29.9 (L) 39.0 - 52.0 %   MCV 84.5 78.0 - 100.0 fL   MCH 27.4 26.0 - 34.0 pg   MCHC 32.4 30.0 - 36.0 g/dL   RDW 15.1 11.5 - 15.5 %   Platelets 129 (L) 150 - 400 K/uL   Neutrophils Relative % 46 %   Lymphocytes Relative 17 %   Monocytes Relative 37 %   Eosinophils Relative 0 %   Basophils Relative 0 %   Neutro Abs 0.2 (L) 1.7 - 7.7 K/uL   Lymphs Abs 0.1 (L) 0.7 - 4.0 K/uL   Monocytes Absolute 0.1 0.1 - 1.0 K/uL   Eosinophils Absolute 0.0 0.0 - 0.7 K/uL   Basophils  Absolute 0.0 0.0 - 0.1 K/uL   WBC Morphology MILD LEFT SHIFT (1-5% METAS, OCC MYELO, OCC BANDS)     Comment: Performed at [facility], [place] [address] [place], [place] [address]  I-Stat CG4 Lactic Acid, ED     Status: None   Collection Time: 11/10/17  1:23 PM  Result Value Ref Range   Lactic Acid, Venous 0.95 0.5 - 1.9 mmol/L   Dg Chest 2 View  Result Date: 11/09/2017 CLINICAL DATA:  Pt reports hemoptysis starting around 230p. He also reports green/gray "bit" coming up on his tongue. He has a hx of tonsil cancer and metastatic lung cancer. He also endorses body aches and fatigue. AANDOx4. ex-smoker. EXAM: CHEST - 2 VIEW COMPARISON:  None. FINDINGS: Heart size is normal. There is LEFT hilar fullness and parenchymal opacity extending to the posterior LEFT UPPER lobe, suspicious for mass and/or consolidation. No pulmonary edema. RIGHT lung is clear. IMPRESSION: Suspect LEFT hilar mass and possible obstructive pneumonia or  atelectasis in the LEFT UPPER lobe. Comparison prior studies would be helpful if they are available. Consider CT of the chest as needed. Electronically Signed   By: Nolon Nations M.D.   On: 11/09/2017 18:47    Pending Labs Unresulted Labs (From admission, onward)   Start     Ordered   11/10/17 1246  Culture, blood (routine x 2)  BLOOD CULTURE X 2,   STAT     11/10/17 1245   11/10/17 1246  Urinalysis, Routine w reflex microscopic  STAT,   STAT     11/10/17 1245      Vitals/Pain Today's Vitals   11/10/17 1146 11/10/17 1305  BP: 112/77 112/75  Pulse: (!) 103 90  Resp: 18 (!) 21  Temp: 99.9 F (37.7 C)   TempSrc: Oral   SpO2: 95% 97%  Weight: 110 lb (49.9 kg)   Height: _0  (1.778 m)     Isolation Precautions No active isolations  Medications Medications  sodium chloride 0.9 % bolus 1,000 mL (0 mLs Intravenous Stopped 11/10/17 1456)  ceFEPIme (MAXIPIME) 2 g in sodium chloride 0.9 % 100 mL IVPB (2 g Intravenous New Bag/Given 11/10/17 1506)

## 2017-11-10 NOTE — ED Provider Notes (Signed)
Bradley Oliver DEPT Provider Note   CSN: [id number] Arrival date & time: 11/10/17  1140     History   Chief Complaint Chief Complaint  Patient presents with  . Fever    cancer pt    HPI Bradley Oliver is a 62 y.o. male.  He has a history of head and neck cancer status post chemo and XRT and history of lung cancer.  He is currently on chemo active and follows with Dr. Reece Levy of [facility] oncology.  He is a long-standing history of's difficulty swallowing poor weight gain.  He was here yesterday with increased throat pain difficulty swallowing and was found to be dehydrated.  He was also neutropenic then.  He did not have a fever but after discussion with [facility] oncology the recommendation was started him on Diflucan and Augmentin.  He was told to observe for any fevers and he experienced a fever up to 102 this morning.  Is otherwise symptoms remain the same of throat pain difficulty swallowing pain in his mouth and his throat.  He has had a cough that is been productive of some white sputum.  He denies any abdominal pain difficulty urinating.  There is been no swollen or red joints although he does complain of some diffuse arthralgia.  The history is provided by the patient.  Fever   This is a new problem. The current episode started 6 to 12 hours ago. The maximum temperature noted was 102 to 102.9 F. Associated symptoms include congestion, sore throat, muscle aches and cough. Pertinent negatives include no chest pain, no vomiting and no headaches. The treatment provided no relief.    Past Medical History:  Diagnosis Date  . Blood in stool   . Cancer ([place]) 08/05/14   right tonsil, P16 positive    There are no active problems to display for this patient.   Past Surgical History:  Procedure Laterality Date  . EYE SURGERY    . KNEE SURGERY    . tonsil biopsy Right 08/05/14   invasive squamous cell carcinoma        Home Medications    Prior to Admission  medications   Medication Sig Start Date End Date Taking? Authorizing Provider  Multiple Vitamins-Minerals (MULTIVITAMIN ADULT) TABS Take 1 tablet by mouth daily.   Yes [provider]  albuterol (PROVENTIL HFA;VENTOLIN HFA) 108 (90 Base) MCG/ACT inhaler USE 2 PUFFS 4 TIMES A DAY AS NEEDED SOB AND WHEEZING 08/09/17   [provider]  amoxicillin-clavulanate (AUGMENTIN) 875-125 MG tablet Take 1 tablet by mouth every 12 (twelve) hours. 11/09/17   Charlann Lange, PA-C  benzocaine (HURRICAINE) 20 % oral spray Use as directed 1 application in the mouth or throat 4 (four) times daily as needed for throat irritation / pain. 11/09/17   Charlann Lange, PA-C  dexamethasone (DECADRON) 4 MG tablet TAKE 2 TABLETS AT 8AM 1 DAY BEFORE CHEMO, 2 TABS DAY AFTER CHEMO AND 1 TAB 2ND DAY AFTER CHEMO 10/26/17   [provider]  fluconazole (DIFLUCAN) 10 MG/ML suspension Take 10 mLs (100 mg total) by mouth daily for 7 days. 11/09/17 11/16/17  Charlann Lange, PA-C  prochlorperazine (COMPAZINE) 10 MG tablet TAKE 1 TABLET EVERY 6 HOURS AS NEEDED FOR NAUSEA OR VOMITING 10/26/17   [provider]  ranitidine (ZANTAC) 150 MG tablet Take by mouth. 09/12/14 09/12/15  [provider]    Family History Family History  Problem Relation Age of Onset  . Hypertension Mother   .  Diabetes Father   . Diabetes Paternal Grandmother   . Diabetes Paternal Grandfather     Social History Social History   Tobacco Use  . Smoking status: Former Research scientist (life sciences)  . Tobacco comment: smoked for short time as teenager  Substance Use Topics  . Alcohol use: Not on file  . Drug use: Not on file     Allergies   Patient has no known allergies.   Review of Systems Review of Systems  Constitutional: Positive for fever. Negative for chills.  HENT: Positive for congestion, rhinorrhea, sore throat, trouble swallowing and voice change.   Eyes: Negative for pain and visual disturbance.  Respiratory: Positive for  cough and shortness of breath.   Cardiovascular: Negative for chest pain.  Gastrointestinal: Negative for abdominal pain and vomiting.  Genitourinary: Negative for dysuria and hematuria.  Musculoskeletal: Positive for arthralgias and neck pain. Negative for back pain.  Skin: Negative for rash and wound.  Neurological: Negative for seizures and headaches.     Physical Exam Updated Vital Signs BP 112/77 (BP Location: Right Arm)   Pulse (!) 103   Temp 99.9 F (37.7 C) (Oral)   Resp 18   Ht 5\' 10"  (1.778 m)   Wt 49.9 kg (110 lb)   SpO2 95%   BMI 15.78 kg/m   Physical Exam  Constitutional: He is oriented to person, place, and time. He appears cachectic.  HENT:  Head: Normocephalic and atraumatic.  Right Ear: External ear normal.  Left Ear: External ear normal.  Mouth/Throat: Oropharyngeal exudate present.  He has some green-gray exudate in the posterior pharynx.  Dry mucous membranes.  Eyes: Pupils are equal, round, and reactive to light. Conjunctivae and EOM are normal.  Neck: No tracheal deviation present.  Postsurgical changes in the neck.  Cardiovascular: Regular rhythm and normal heart sounds. Tachycardia present.  Pulmonary/Chest: Effort normal and breath sounds normal. He has no wheezes. He has no rales.  Abdominal: Soft. He exhibits no mass. There is no tenderness. There is no guarding.  Musculoskeletal: Normal range of motion. He exhibits no tenderness or deformity.  Neurological: He is alert and oriented to person, place, and time.  Skin: Skin is warm and dry. Capillary refill takes less than 2 seconds. No rash noted.     ED Treatments / Results  Labs (all labs ordered are listed, but only abnormal results are displayed) Labs Reviewed  BASIC METABOLIC PANEL - Abnormal; Notable for the following components:      Result Value   Chloride 99 (*)    Glucose, Bld 100 (*)    BUN 28 (*)    Calcium 8.8 (*)    All other components within normal limits  CBC WITH  DIFFERENTIAL/PLATELET - Abnormal; Notable for the following components:   WBC 0.4 (*)    RBC 3.54 (*)    Hemoglobin 9.7 (*)    HCT 29.9 (*)    Platelets 129 (*)    Neutro Abs 0.2 (*)    Lymphs Abs 0.1 (*)    All other components within normal limits  CULTURE, BLOOD (ROUTINE X 2)  CULTURE, BLOOD (ROUTINE X 2)  URINALYSIS, ROUTINE W REFLEX MICROSCOPIC  I-STAT CG4 LACTIC ACID, ED    EKG None  Radiology Dg Chest 2 View  Result Date: 11/09/2017 CLINICAL DATA:  Pt reports hemoptysis starting around 230p. He also reports green/gray "bit" coming up on his tongue. He has a hx of tonsil cancer and metastatic lung cancer. He also endorses body aches and  fatigue. AANDOx4. ex-smoker. EXAM: CHEST - 2 VIEW COMPARISON:  None. FINDINGS: Heart size is normal. There is LEFT hilar fullness and parenchymal opacity extending to the posterior LEFT UPPER lobe, suspicious for mass and/or consolidation. No pulmonary edema. RIGHT lung is clear. IMPRESSION: Suspect LEFT hilar mass and possible obstructive pneumonia or atelectasis in the LEFT UPPER lobe. Comparison prior studies would be helpful if they are available. Consider CT of the chest as needed. Electronically Signed   By: Nolon Nations M.D.   On: 11/09/2017 18:47    Procedures Procedures (including critical care time)  Medications Ordered in ED Medications  ceFEPIme (MAXIPIME) 2 g in sodium chloride 0.9 % 100 mL IVPB (2 g Intravenous New Bag/Given 11/10/17 1506)  sodium chloride 0.9 % bolus 1,000 mL (1,000 mLs Intravenous New Bag/Given 11/10/17 1355)     Initial Impression / Assessment and Plan / ED Course  I have reviewed the triage vital signs and the nursing notes.  Pertinent labs & imaging results that were available during my care of the patient were reviewed by me and considered in my medical decision making (see chart for details).  Clinical Course as of Nov 12 1098  Fri Nov 10, 3120  2548 62 year old male with metastatic cancer and  active chemotherapy here with a fever.  He was just started on antifungal and antimicrobial therapy yesterday with Diflucan and Augmentin.  He is getting some screening labs repeat cultures and some IV fluids.  Once I get some labs back I will call [facility] oncology to find out if they want him transferred or admitted here.   [MB]  2992 Discussed with [facility] cancer service who is going to run this by their attending and get back to me with recommendations.   [MB]  1530 Still waiting for callback from [facility] regarding the recommendations but the patient is asked to be admitted here not transferred to [facility].  I placed a call into hospitalist.   [MB]  1544 Discussed with Dr. Markham Jordan from [facility].  He agrees with antibiotic coverage cefepime he says sometimes at their facility they go with Zosyn and Vanco but he thought the cefepime was appropriate.  He recommended may be getting a throat swab to see if we can identify if this is bacterial versus fungal in the throat.  He was also talking about some Diflucan swish and swallow.  I will update the hospitalist with these recommendations.   [MB]    Clinical Course User Index [MB] Hayden Rasmussen, MD     Final Clinical Impressions(s) / ED Diagnoses   Final diagnoses:  Neutropenic fever ([place])  Metastatic cancer [facility])    ED Discharge Orders    None       Hayden Rasmussen, MD 11/12/17 1101

## 2017-11-11 LAB — BASIC METABOLIC PANEL
ANION GAP: 10 (ref 5–15)
BUN: 29 mg/dL — ABNORMAL HIGH (ref 6–20)
CALCIUM: 8.5 mg/dL — AB (ref 8.9–10.3)
CHLORIDE: 104 mmol/L (ref 101–111)
CO2: 24 mmol/L (ref 22–32)
Creatinine, Ser: 0.55 mg/dL — ABNORMAL LOW (ref 0.61–1.24)
GFR calc non Af Amer: >60 mL/min (ref >60)
Glucose, Bld: 82 mg/dL (ref 65–99)
POTASSIUM: 3.5 mmol/L (ref 3.5–5.1)
Sodium: 138 mmol/L (ref 135–145)

## 2017-11-11 LAB — URINALYSIS, ROUTINE W REFLEX MICROSCOPIC
Bacteria, UA: NONE SEEN
Bilirubin Urine: NEGATIVE
Glucose, UA: NEGATIVE mg/dL
KETONES UR: 80 mg/dL — AB
Leukocytes, UA: NEGATIVE
Nitrite: NEGATIVE
PH: 5 (ref 5.0–8.0)
PROTEIN: 30 mg/dL — AB
Specific Gravity, Urine: 1.024 (ref 1.005–1.030)

## 2017-11-11 LAB — CBC WITH DIFFERENTIAL/PLATELET
BASOS PCT: 0 %
Basophils Absolute: 0 10*3/uL (ref 0.0–0.1)
EOS ABS: 0 10*3/uL (ref 0.0–0.7)
EOS PCT: 0 %
HCT: 28.9 % — ABNORMAL LOW (ref 39.0–52.0)
HEMOGLOBIN: 9.4 g/dL — AB (ref 13.0–17.0)
Lymphocytes Relative: 17 %
Lymphs Abs: 0.2 10*3/uL — ABNORMAL LOW (ref 0.7–4.0)
MCH: 27.2 pg (ref 26.0–34.0)
MCHC: 32.5 g/dL (ref 30.0–36.0)
MCV: 83.5 fL (ref 78.0–100.0)
MONOS PCT: 11 %
Monocytes Absolute: 0.1 10*3/uL (ref 0.1–1.0)
NEUTROS PCT: 72 %
Neutro Abs: 0.9 10*3/uL — ABNORMAL LOW (ref 1.7–7.7)
Platelets: 133 10*3/uL — ABNORMAL LOW (ref 150–400)
RBC: 3.46 MIL/uL — ABNORMAL LOW (ref 4.22–5.81)
RDW: 15.2 % (ref 11.5–15.5)
WBC: 1.2 10*3/uL — CL (ref 4.0–10.5)

## 2017-11-11 LAB — HIV ANTIBODY (ROUTINE TESTING W REFLEX): HIV Screen 4th Generation wRfx: NONREACTIVE

## 2017-11-11 NOTE — Progress Notes (Signed)
PROGRESS NOTE    Bradley Oliver. Froh  KGY:[id number] DOB: Jul 30, 1955 DOA: 11/10/2017 PCP: Patient, No Pcp Per    Brief Narrative:  62 y.o. male with medical history significant of metastatic tonsil squamous cell carcinoma who presents with fever, throat pain, cough with green/gray sputum.  Assessment & Plan:   Principal Problem:   Neutropenic fever (HCC) -WBC levels rising.  We will continue IV antibiotic coverage until patient is fever free for 24-hour period.  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 Disposition Plan:  Pending improvement in condition   Consultants:   None   Procedures: None   Antimicrobials: Cefepime   Subjective: Pt has no new complaints.  Objective: Vitals:   11/10/17 2054 11/10/17 2326 11/11/17 0500 11/11/17 0504  BP: (!) 148/96  111/72 111/72  Pulse: 97  76 76  Resp: 14  12 12   Temp: (!) 101.3 F (38.5 C) 99.3 F (37.4 C) 98.9 F (37.2 C) 98.9 F (37.2 C)  TempSrc: Oral Oral Oral Oral  SpO2: 99%  98% 98%  Weight:      Height:       No intake or output data in the 24 hours ending 11/11/17 1309 Filed Weights   11/10/17 1146 11/10/17 1703  Weight: 49.9 kg (110 lb) 49.4 kg (109 lb)    Examination:  General exam: Appears calm and comfortable, in nad. Respiratory system: cough, equal chest rise, no wheeze Cardiovascular system: S1 & S2 heard, RRR. No JVD, murmurs, rubs, gallops or clicks. No pedal edema. Gastrointestinal system: Abdomen is nondistended, soft and nontender. No organomegaly or masses felt. Normal bowel sounds heard. Central nervous system: Alert and oriented. No focal neurological deficits. Extremities: Symmetric 5 x 5 power. Skin: No rashes, lesions or ulcers Psychiatry: . Mood & affect appropriate.   Data Reviewed: I have personally  reviewed following labs and imaging studies  CBC: Recent Labs  Lab 11/09/17 1828 11/10/17 1306 11/11/17 0339  WBC 0.2* 0.4* 1.2*  NEUTROABS 0.0* 0.2* 0.9*  HGB 11.2* 9.7* 9.4*  HCT 33.8* 29.9* 28.9*  MCV 81.8 84.5 83.5  PLT 161 129* 026*   Basic Metabolic Panel: Recent Labs  Lab 11/09/17 1828 11/10/17 1306 11/11/17 0339  NA 134* 138 138  K 4.1 3.6 3.5  CL 94* 99* 104  CO2 27 26 24   GLUCOSE 135* 100* 82  BUN 41* 28* 29*  CREATININE 0.80 0.73 0.55*  CALCIUM 9.3 8.8* 8.5*   GFR: Estimated Creatinine Clearance: 67.8 mL/min (A) (by C-G formula based on SCr of 0.55 mg/dL (L)). Liver Function Tests: Recent Labs  Lab 11/09/17 1828  AST 39  ALT 38  ALKPHOS 70  BILITOT 1.2  PROT 7.6  ALBUMIN 3.3*   No results for input(s): LIPASE, AMYLASE in the last 168 hours. No results for input(s): AMMONIA in the last 168 hours. Coagulation Profile: No results for input(s): INR, PROTIME in the last 168 hours. Cardiac Enzymes: No results for input(s): CKTOTAL, CKMB, CKMBINDEX, TROPONINI in the last 168 hours. BNP (last 3 results) No results for input(s): PROBNP in the last 8760 hours. HbA1C: No results for input(s): HGBA1C in the last 72 hours. CBG: No results for input(s): GLUCAP in the last 168 hours. Lipid Profile: No results for input(s): CHOL, HDL, LDLCALC, TRIG, CHOLHDL, LDLDIRECT in the last 72 hours. Thyroid Function Tests: No results for input(s): TSH,  T4TOTAL, FREET4, T3FREE, THYROIDAB in the last 72 hours. Anemia Panel: No results for input(s): VITAMINB12, FOLATE, FERRITIN, TIBC, IRON, RETICCTPCT in the last 72 hours. Sepsis Labs: Recent Labs  Lab 11/10/17 1323  LATICACIDVEN 0.95    Recent Results (from the past 240 hour(s))  Culture, blood (routine x 2)     Status: None (Preliminary result)   Collection Time: 11/09/17  9:06 PM  Result Value Ref Range Status   Specimen Description   Final    BLOOD RIGHT ANTECUBITAL Performed at [facility] [address], [place], [place] [address]    Special Requests   Final    BOTTLES DRAWN AEROBIC AND ANAEROBIC Blood Culture results may not be optimal due to an excessive volume of blood received in culture bottles Performed at [facility] [address] [place], [place] [address]    Culture   Final    NO GROWTH < 12 HOURS Performed at [facility] [address], [place], [place] [address]    Report Status PENDING  Incomplete  Culture, blood (routine x 2)     Status: None (Preliminary result)   Collection Time: 11/09/17  9:06 PM  Result Value Ref Range Status   Specimen Description   Final    BLOOD BLOOD LEFT WRIST Performed at [facility] [address], [place], [place] [address]    Special Requests   Final    BOTTLES DRAWN AEROBIC AND ANAEROBIC Blood Culture adequate volume Performed at [facility] [address], [place], [place] [address]    Culture   Final    NO GROWTH < 12 HOURS Performed at [facility] [address] [place],  [address]    Report Status PENDING  Incomplete         Radiology Studies: Dg Chest 2 View  Result Date: 11/09/2017 CLINICAL DATA:  Pt reports hemoptysis starting around 230p. He also reports green/gray "bit" coming up on his tongue. He has a hx of tonsil cancer and metastatic lung cancer. He also endorses body aches and fatigue. AANDOx4. ex-smoker. EXAM: CHEST - 2 VIEW COMPARISON:  None. FINDINGS: Heart size is normal. There is LEFT hilar fullness and parenchymal opacity extending to the posterior LEFT UPPER lobe, suspicious for mass and/or consolidation. No pulmonary edema. RIGHT lung is clear. IMPRESSION: Suspect LEFT hilar mass and possible obstructive pneumonia or atelectasis in the LEFT UPPER lobe. Comparison prior studies would be helpful if they are available. Consider CT of the chest as needed. Electronically Signed   By: Nolon Nations M.D.    On: 11/09/2017 18:47   Scheduled Meds: . enoxaparin (LOVENOX) injection  40 mg Subcutaneous Q24H  . fluconazole  100 mg Oral Daily   Continuous Infusions: . sodium chloride 100 mL/hr at 11/10/17 1754  . ceFEPime (MAXIPIME) IV 2 g (11/11/17 0615)     LOS: 1 day    Time spent: 95 min  Velvet Bathe, MD [facility] Pager [phone]  If 7PM-7AM, please contact night-coverage [email or web address] Password [facility] 11/11/2017, 1:09 PM

## 2017-11-11 NOTE — Progress Notes (Signed)
RN called into patients room during morning rounds.  Patient showed RN what appeared to be a piece of soft tissue, nickel sized, which the patient coughed up.  RN placed this into a specimen cup.  Patient asking to have the specimen sent to his pathologist.  RN explained to patient that hospital MD will need to place orders for pathology study.  RN spoke with patient and made plan for patient to speak with MD during rounds regarding this.  RN followed up with patient in the afternoon, who stated MD had already made rounds.  When asked what the MD discussed regarding the sample coughed up, patient stated MD told him to take the specimen home.  Patient's brother at bedside and will take the specimen cup.

## 2017-11-12 NOTE — Progress Notes (Signed)
PROGRESS NOTE    Bradley Oliver  EQA:[id number] DOB: [birth year] DOA: 11/10/2017 PCP: Patient, No Pcp Per    Brief Narrative:  62 y.o. male with medical history significant of metastatic tonsil squamous cell carcinoma who presents with fever, throat pain, cough with green/gray sputum.  Assessment & Plan:   Principal Problem:   Neutropenic fever ([place]) -WBC levels rising. - continue current antimicrobial agents cefepime and diflucan.  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 Disposition Plan:  Likely d/c next am.   Consultants:   None   Procedures: None   Antimicrobials: Cefepime   Subjective: Pt has no new complaints.  Objective: Vitals:   11/11/17 0504 11/11/17 1353 11/11/17 2039 11/12/17 0530  BP: 111/72 103/79 130/75 124/72  Pulse: 76 85 79 80  Resp: 12 18 16 14   Temp: 98.9 F (37.2 C) 99.2 F (37.3 C) 99.5 F (37.5 C) 99 F (37.2 C)  TempSrc: Oral Oral Oral Oral  SpO2: 98% 100% 99% 98%  Weight:      Height:        Intake/Output Summary (Last 24 hours) at 11/12/2017 1236 Last data filed at 11/11/2017 1653 Gross per 24 hour  Intake 2498.33 ml  Output -  Net 2498.33 ml   Filed Weights   11/10/17 1146 11/10/17 1703  Weight: 49.9 kg (110 lb) 49.4 kg (109 lb)    Examination:  General exam: Pt in nad, alert and awake Cardiovascular system: S1 & S2 heard, RRR. No JVD, murmurs, rubs, gallops or clicks. No pedal edema. Respiratory system: cough, equal chest rise, no wheeze Central nervous system: Alert and oriented. No focal neurological deficits. Gastrointestinal system: Abdomen is nondistended, soft and nontender. No organomegaly or masses felt. Normal bowel sounds heard. Extremities: Symmetric 5 x 5 power. Skin: No rashes, lesions or ulcers Psychiatry: . Mood &  affect appropriate.   Data Reviewed: I have personally reviewed following labs and imaging studies  CBC: Recent Labs  Lab 11/09/17 1828 11/10/17 1306 11/11/17 0339  WBC 0.2* 0.4* 1.2*  NEUTROABS 0.0* 0.2* 0.9*  HGB 11.2* 9.7* 9.4*  HCT 33.8* 29.9* 28.9*  MCV 81.8 84.5 83.5  PLT 161 129* 979*   Basic Metabolic Panel: Recent Labs  Lab 11/09/17 1828 11/10/17 1306 11/11/17 0339  NA 134* 138 138  K 4.1 3.6 3.5  CL 94* 99* 104  CO2 27 26 24   GLUCOSE 135* 100* 82  BUN 41* 28* 29*  CREATININE 0.80 0.73 0.55*  CALCIUM 9.3 8.8* 8.5*   GFR: Estimated Creatinine Clearance: 67.8 mL/min (A) (by C-G formula based on SCr of 0.55 mg/dL (L)). Liver Function Tests: Recent Labs  Lab 11/09/17 1828  AST 39  ALT 38  ALKPHOS 70  BILITOT 1.2  PROT 7.6  ALBUMIN 3.3*   No results for input(s): LIPASE, AMYLASE in the last 168 hours. No results for input(s): AMMONIA in the last 168 hours. Coagulation Profile: No results for input(s): INR, PROTIME in the last 168 hours. Cardiac Enzymes: No results for input(s): CKTOTAL, CKMB, CKMBINDEX, TROPONINI in the last 168 hours. BNP (last 3 results) No results for input(s): PROBNP in the last 8760 hours. HbA1C: No results for input(s): HGBA1C in the last 72 hours. CBG: No results for input(s): GLUCAP in the last 168 hours. Lipid Profile: No results for input(s): CHOL, HDL, LDLCALC, TRIG, CHOLHDL, LDLDIRECT in the  last 72 hours. Thyroid Function Tests: No results for input(s): TSH, T4TOTAL, FREET4, T3FREE, THYROIDAB in the last 72 hours. Anemia Panel: No results for input(s): VITAMINB12, FOLATE, FERRITIN, TIBC, IRON, RETICCTPCT in the last 72 hours. Sepsis Labs: Recent Labs  Lab 11/10/17 1323  LATICACIDVEN 0.95    Recent Results (from the past 240 hour(s))  Culture, blood (routine x 2)     Status: None (Preliminary result)   Collection Time: 11/09/17  9:06 PM  Result Value Ref Range Status   Specimen Description   Final    BLOOD  RIGHT ANTECUBITAL Performed at [facility] [address] [place], [place] [address]    Special Requests   Final    BOTTLES DRAWN AEROBIC AND ANAEROBIC Blood Culture results may not be optimal due to an excessive volume of blood received in culture bottles Performed at [facility] [address], [place], [place] [address]    Culture   Final    NO GROWTH 2 DAYS Performed at [facility] [address], [place], [place] [address]    Report Status PENDING  Incomplete  Culture, blood (routine x 2)     Status: None (Preliminary result)   Collection Time: 11/09/17  9:06 PM  Result Value Ref Range Status   Specimen Description   Final    BLOOD BLOOD LEFT WRIST Performed at [facility] [address], [place], [place] [address]    Special Requests   Final    BOTTLES DRAWN AEROBIC AND ANAEROBIC Blood Culture adequate volume Performed at [facility] [address], [place], [place] [address]    Culture   Final    NO GROWTH 2 DAYS Performed at [facility] [address], [place], [place] [address]    Report Status PENDING  Incomplete  Culture, blood (routine x 2)     Status: None (Preliminary result)   Collection Time: 11/10/17  1:07 PM  Result Value Ref Range Status   Specimen Description   Final    BLOOD LEFT ANTECUBITAL Performed at [facility] [address], [place], [place] [address]    Special Requests   Final    BOTTLES DRAWN AEROBIC AND ANAEROBIC Blood Culture results may not be optimal due to an excessive volume of blood received in culture bottles Performed at [facility] [address] [place], [place] [address]    Culture   Final    NO GROWTH 1 DAY Performed at [facility], [place] [address] [place], [place] [address]    Report Status PENDING  Incomplete  Culture, blood (routine x 2)     Status: None (Preliminary result)    Collection Time: 11/10/17  1:39 PM  Result Value Ref Range Status   Specimen Description   Final    BLOOD RIGHT FOREARM Performed at [facility] [address], [place], [place] [address]    Special Requests   Final    BOTTLES DRAWN AEROBIC AND ANAEROBIC Blood Culture adequate volume Performed at [facility] [address], [place], [place] [address]    Culture   Final    NO GROWTH 1 DAY Performed at [facility], [place] [address], [place], [place] [address]    Report Status PENDING  Incomplete  Culture, group A strep     Status: None (Preliminary result)   Collection Time: 11/10/17  7:38 PM  Result  Value Ref Range Status   Specimen Description   Final    THROAT Performed at [facility] [address], [place], [place] [address]    Special Requests   Final    NONE Performed at [facility], [place] [address] [place], [place] [address]    Culture   Final    TOO YOUNG TO READ Performed at [facility], [place] [address], [place], [place] [address]    Report Status PENDING  Incomplete         Radiology Studies: No results found. Scheduled Meds: . enoxaparin (LOVENOX) injection  40 mg Subcutaneous Q24H  . fluconazole  100 mg Oral Daily   Continuous Infusions: . sodium chloride 100 mL/hr at 11/11/17 1604  . ceFEPime (MAXIPIME) IV 2 g (11/12/17 0537)     LOS: 2 days    Time spent: 35 min  Velvet Bathe, MD [facility] Pager [phone]  If 7PM-7AM, please contact night-coverage [email or web address] Password [facility] 11/12/2017, 12:36 PM

## 2017-11-12 NOTE — Progress Notes (Signed)
Initial Nutrition Assessment  DOCUMENTATION CODES:   Severe malnutrition in context of chronic illness, Underweight  INTERVENTION:   - Pt declined oral nutrition supplements (RD recommends Boost Breeze po TID, each supplement provides 250 kcal and 9 grams of protein while pt on clear liquid diet)  - Encouraged PO intake from oral nutrition supplements brought from home  NUTRITION DIAGNOSIS:   Severe Malnutrition related to chronic illness, cancer and cancer related treatments, dysphagia, poor appetite(metastatic tonsil squamous cell carcinoma currently undergoing chemotherapy) as evidenced by severe fat depletion, severe muscle depletion.  GOAL:   Patient will meet greater than or equal to 90% of their needs  MONITOR:   PO intake, Supplement acceptance, Diet advancement, Weight trends, I & O's, Labs, Goals of Care  REASON FOR ASSESSMENT:   Malnutrition Screening Tool    ASSESSMENT:   62 year old male who presented to the ED with fever, throat pain, and cough. PMH significant for metastatic tonsil squamous cell carcinoma currently undergoing chemotherapy with [facility]. Pt is also s/p XRT with a history of lung cancer.  Spoke with pt and brother at bedside. Pt took a phone call during RD visit to unable to obtain a more detailed diet history. RD will attempt to return as schedule permits.  Discussed pt with RN who reports that she has not witness pt consuming any oral nutrition supplements. Pt with 4 unopened cartons of Boost Very High Calorie at bedside.  Pt states that he drinks 3-4 Boost Very High Calorie oral nutrition supplements daily (approximately 1600-2100 kcal/day). Pt reports that he no longer consumes solid foods and consumes a full supplement over the course of 1-2 hours.  Pt states that during his "first round," he "lived off of Boost for 8 months." Pt reports making 2 milkshakes daily that consisted of 2-3 scoops of ice cream, 1 Boost Very High Calorie  supplement, and whipped cream. 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  Pt's brother states that pt has lost over 30 lbs. Pt's brother unaware of pt's UBW. Last weight recorded in chart is 130 lbs on 12/30/14. Unsure of accuracy of this weight as it appears to be stated rather than measured. RD suspects significant weight loss although unable to confirm without weight history.  Pt states that his throat swelled up after last chemo treatment and that "it almost killed me." Pt states that he is just now able to swallow fluids again.  RD offered to order several oral nutrition supplements appropriate for clear liquid diet, but pt declined as he states "I am leaving tomorrow." RD encouraged following up with [place] RD soon after discharge.  Given pt's difficulty swallowing, inability to eat solid foods, and increased nutrition needs, it is unlikely that pt will be able to meet nutritional needs by oral nutrition supplement use alone.  Medications reviewed and include: IV antibiotics  Labs reviewed: BUN 29 (H), creatinine 0.55 (L), WBC 1.2 (L), RBC 3.46 (L), hemoglobin 9.4 (L), HCT 28.9 (L), platelets 133 (L)  NUTRITION - FOCUSED PHYSICAL EXAM:    Most Recent Value  Orbital Region  Severe depletion  Upper Arm Region  Severe depletion  Thoracic and Lumbar Region  Severe depletion  Buccal Region  Severe depletion  Temple Region  Severe depletion  Clavicle Bone Region  Severe depletion  Clavicle and Acromion Bone Region  Severe depletion  Scapular Bone Region  Unable to assess  Dorsal Hand  Severe depletion  Patellar Region  Severe depletion  Anterior Thigh Region  Severe depletion  Posterior Calf Region  Severe depletion  Edema (RD Assessment)  None  Hair  Reviewed  Eyes  Reviewed  Mouth  Reviewed  Skin  Reviewed  Nails  Reviewed       Diet Order:   Diet  Order           Diet clear liquid Room service appropriate? Yes; Fluid consistency: Thin  Diet effective now          EDUCATION NEEDS:   Education needs have been addressed  Skin:  Skin Assessment: Reviewed RN Assessment  Last BM:  11/08/17  Height:   Ht Readings from Last 1 Encounters:  11/10/17 5\' 10"  (1.778 m)    Weight:   Wt Readings from Last 1 Encounters:  11/10/17 109 lb (49.4 kg)    Ideal Body Weight:  75.5 kg  BMI:  Body mass index is 15.64 kg/m.  Estimated Nutritional Needs:   Kcal:  1750-1950 kcal/day (35-40 kcal/kg)  Protein:  75-90 grams/day  Fluid:  1.8-2.0 L/day    Gaynell Face, MS, RD, LDN Pager: [phone] Weekend/After Hours: [phone]

## 2017-11-13 DIAGNOSIS — E43 Unspecified severe protein-calorie malnutrition: Secondary | ICD-10-CM

## 2017-11-13 LAB — CBC WITH DIFFERENTIAL/PLATELET
Basophils Absolute: 0 10*3/uL (ref 0.0–0.1)
Basophils Relative: 1 %
EOS ABS: 0 10*3/uL (ref 0.0–0.7)
EOS PCT: 1 %
HCT: 26.5 % — ABNORMAL LOW (ref 39.0–52.0)
Hemoglobin: 8.8 g/dL — ABNORMAL LOW (ref 13.0–17.0)
Lymphocytes Relative: 11 %
Lymphs Abs: 0.2 10*3/uL — ABNORMAL LOW (ref 0.7–4.0)
MCH: 27.5 pg (ref 26.0–34.0)
MCHC: 33.2 g/dL (ref 30.0–36.0)
MCV: 82.8 fL (ref 78.0–100.0)
Monocytes Absolute: 0.2 10*3/uL (ref 0.1–1.0)
Monocytes Relative: 8 %
Neutro Abs: 1.6 10*3/uL — ABNORMAL LOW (ref 1.7–7.7)
Neutrophils Relative %: 79 %
PLATELETS: 112 10*3/uL — AB (ref 150–400)
RBC: 3.2 MIL/uL — AB (ref 4.22–5.81)
RDW: 14.7 % (ref 11.5–15.5)
WBC: 2 10*3/uL — AB (ref 4.0–10.5)

## 2017-11-13 LAB — CULTURE, GROUP A STREP (THRC)

## 2017-11-13 MED ORDER — SALINE SPRAY 0.65 % NA SOLN
1.0000 | NASAL | Status: DC | PRN
  Filled 2017-11-13: qty 44

## 2017-11-13 MED ORDER — LORATADINE 10 MG PO TABS
10.0000 mg | ORAL_TABLET | Freq: Every day | ORAL | Status: DC | PRN
  Administered 2017-11-13: 10 mg via ORAL
  Filled 2017-11-13: qty 1

## 2017-11-13 NOTE — Progress Notes (Signed)
CSW consulted to assess needs related to living situation.  Met with pt at bedside- alert, orientedx4, pleasant, and engaged. Pt attempted to call brother to participate but unable to reach brother.  Pt states he lives with his mother and sister. Denied any concerns of abuse or neglect. CSW inquired as to issues with living situation, pt responded, "We have 4 pets-2 cats, a dog, and a bird. I just don't think it is clean enough in there. When I walk down the hall there is dust." Pt asked, "Does [facility] have a relationship with any places in the area I could go to?" CSW explained DC planning is driven by care needs, unable to assist with pt moving to a new location. Pt asked, "What about rehab?" CSW explained rehab at a SNF is with goal of short term therapy and returning to home. He states, "Well that is not what I need then."  Pt denied any need for CSW intervention at this point. CSW informed him that care needs at DC can be addressed should pt need resources then. Pt agreed.  Sharren Bridge, MSW, LCSW Clinical Social Work 11/13/2017 [phone]

## 2017-11-13 NOTE — Progress Notes (Signed)
PROGRESS NOTE    Bradley Oliver. Goldfarb  YQM:[id number] DOB: 1956-05-04 DOA: 11/10/2017 PCP: Patient, No Pcp Per    Brief Narrative:  62 y.o. male with medical history significant of metastatic tonsil squamous cell carcinoma who presents with fever, throat pain, cough with green/gray sputum.  Assessment & Plan:   Principal Problem:   Neutropenic fever ([place]) - WBC levels continue to improve - slow improvement on cefepime and diflucan. Will continue.   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 Disposition Plan:  Likely d/c next am.   Consultants:   None   Procedures: None   Antimicrobials: Cefepime   Subjective: Pt has no new complaints.  Objective: Vitals:   11/12/17 2127 11/13/17 0014 11/13/17 0542 11/13/17 1308  BP: 125/76  109/77 119/76  Pulse: 82  70 82  Resp: 16  13 16   Temp: 99.3 F (37.4 C) 100.2 F (37.9 C) 98.9 F (37.2 C) 99 F (37.2 C)  TempSrc: Oral  Oral Oral  SpO2: 98%  99% 100%  Weight:      Height:       No intake or output data in the 24 hours ending 11/13/17 1426 Filed Weights   11/10/17 1146 11/10/17 1703  Weight: 49.9 kg (110 lb) 49.4 kg (109 lb)    Examination:  General exam: Pt in nad, alert and awake Cardiovascular system: S1 & S2 heard, RRR. No JVD, murmurs, rubs, gallops or clicks.  Respiratory system: cough, equal chest rise, no wheeze Central nervous system: Alert and oriented. No focal neurological deficits. Gastrointestinal system: Abdomen is nondistended, soft and nontender. No organomegaly or masses felt. Normal bowel sounds heard. Extremities: Symmetric 5 x 5 power. Skin: No rashes, lesions or ulcers, on limited exam. Psychiatry:  Mood & affect appropriate.   Data Reviewed: I have personally reviewed following labs and imaging  studies  CBC: Recent Labs  Lab 11/09/17 1828 11/10/17 1306 11/11/17 0339 11/13/17 0520  WBC 0.2* 0.4* 1.2* 2.0*  NEUTROABS 0.0* 0.2* 0.9* 1.6*  HGB 11.2* 9.7* 9.4* 8.8*  HCT 33.8* 29.9* 28.9* 26.5*  MCV 81.8 84.5 83.5 82.8  PLT 161 129* 133* 528*   Basic Metabolic Panel: Recent Labs  Lab 11/09/17 1828 11/10/17 1306 11/11/17 0339  NA 134* 138 138  K 4.1 3.6 3.5  CL 94* 99* 104  CO2 27 26 24   GLUCOSE 135* 100* 82  BUN 41* 28* 29*  CREATININE 0.80 0.73 0.55*  CALCIUM 9.3 8.8* 8.5*   GFR: Estimated Creatinine Clearance: 67.8 mL/min (A) (by C-G formula based on SCr of 0.55 mg/dL (L)). Liver Function Tests: Recent Labs  Lab 11/09/17 1828  AST 39  ALT 38  ALKPHOS 70  BILITOT 1.2  PROT 7.6  ALBUMIN 3.3*   No results for input(s): LIPASE, AMYLASE in the last 168 hours. No results for input(s): AMMONIA in the last 168 hours. Coagulation Profile: No results for input(s): INR, PROTIME in the last 168 hours. Cardiac Enzymes: No results for input(s): CKTOTAL, CKMB, CKMBINDEX, TROPONINI in the last 168 hours. BNP (last 3 results) No results for input(s): PROBNP in the last 8760 hours. HbA1C: No results for input(s): HGBA1C in the last 72 hours. CBG: No results for input(s): GLUCAP in the last 168 hours. Lipid Profile: No results for input(s): CHOL, HDL, LDLCALC, TRIG, CHOLHDL, LDLDIRECT in the last 72 hours. Thyroid Function Tests:  No results for input(s): TSH, T4TOTAL, FREET4, T3FREE, THYROIDAB in the last 72 hours. Anemia Panel: No results for input(s): VITAMINB12, FOLATE, FERRITIN, TIBC, IRON, RETICCTPCT in the last 72 hours. Sepsis Labs: Recent Labs  Lab 11/10/17 1323  LATICACIDVEN 0.95    Recent Results (from the past 240 hour(s))  Culture, blood (routine x 2)     Status: None (Preliminary result)   Collection Time: 11/09/17  9:06 PM  Result Value Ref Range Status   Specimen Description   Final    BLOOD RIGHT ANTECUBITAL Performed at [facility] [address], [place], [place] [address]    Special Requests   Final    BOTTLES DRAWN AEROBIC AND ANAEROBIC Blood Culture results may not be optimal due to an excessive volume of blood received in culture bottles Performed at [facility] [address], [place], [place] [address]    Culture   Final    NO GROWTH 4 DAYS Performed at [facility], [place] [address] [place], [place] [address]    Report Status PENDING  Incomplete  Culture, blood (routine x 2)     Status: None (Preliminary result)   Collection Time: 11/09/17  9:06 PM  Result Value Ref Range Status   Specimen Description   Final    BLOOD BLOOD LEFT WRIST Performed at [facility] [address] [place], [place] [address]    Special Requests   Final    BOTTLES DRAWN AEROBIC AND ANAEROBIC Blood Culture adequate volume Performed at [facility] [address], [place], [place] [address]    Culture   Final    NO GROWTH 4 DAYS Performed at [facility], [place] [address] [place], [place] [address]    Report Status PENDING  Incomplete  Culture, blood (routine x 2)     Status: None (Preliminary result)   Collection Time: 11/10/17  1:07 PM  Result Value Ref Range Status   Specimen Description   Final    BLOOD LEFT ANTECUBITAL Performed at [facility] [address], [place], [place] [address]    Special Requests   Final    BOTTLES DRAWN AEROBIC AND ANAEROBIC Blood Culture results may not be optimal due to an excessive volume of blood received in culture bottles Performed at [facility] [address] [place], [place] [address]    Culture   Final    NO GROWTH 3 DAYS Performed at [facility], [place] [address], [place], [place] [address]    Report Status PENDING  Incomplete  Culture, blood (routine x 2)     Status: None (Preliminary result)   Collection Time: 11/10/17  1:39 PM  Result  Value Ref Range Status   Specimen Description   Final    BLOOD RIGHT FOREARM Performed at [facility] [address], [place], [place] [address]    Special Requests   Final    BOTTLES DRAWN AEROBIC AND ANAEROBIC Blood Culture adequate volume Performed at [facility] [address] [place], [place] [address]    Culture   Final    NO GROWTH 3 DAYS Performed at [facility], [place] [address], [place], [place] [address]    Report Status PENDING  Incomplete  Culture, group A strep     Status: None   Collection Time: 11/10/17  7:38 PM  Result Value Ref Range Status   Specimen Description  Final    THROAT Performed at [facility], [place] [address] [place], [place] [address]    Special Requests   Final    NONE Performed at [facility], [place] [address] [place], [place] [address]    Culture   Final    NO GROUP A STREP (S.PYOGENES) ISOLATED Performed at [facility], [place] [address], [place], [place] [address]    Report Status 11/13/2017 FINAL  Final         Radiology Studies: No results found. Scheduled Meds: . enoxaparin (LOVENOX) injection  40 mg Subcutaneous Q24H  . fluconazole  100 mg Oral Daily   Continuous Infusions: . sodium chloride 100 mL/hr at 11/11/17 1604  . ceFEPime (MAXIPIME) IV 2 g (11/13/17 1316)     LOS: 3 days    Time spent: 35 min  Velvet Bathe, MD [facility] Pager [phone]  If 7PM-7AM, please contact night-coverage [email or web address] Password [email or web address] 11/13/2017, 2:26 PM

## 2017-11-14 ENCOUNTER — Encounter (HOSPITAL_COMMUNITY): Payer: Self-pay

## 2017-11-14 LAB — CBC WITH DIFFERENTIAL/PLATELET
BASOS ABS: 0 10*3/uL (ref 0.0–0.1)
BASOS PCT: 0 %
EOS ABS: 0 10*3/uL (ref 0.0–0.7)
EOS PCT: 0 %
HCT: 26.3 % — ABNORMAL LOW (ref 39.0–52.0)
Hemoglobin: 8.7 g/dL — ABNORMAL LOW (ref 13.0–17.0)
Lymphocytes Relative: 13 %
Lymphs Abs: 0.2 10*3/uL — ABNORMAL LOW (ref 0.7–4.0)
MCH: 27.1 pg (ref 26.0–34.0)
MCHC: 33.1 g/dL (ref 30.0–36.0)
MCV: 81.9 fL (ref 78.0–100.0)
MONO ABS: 0.1 10*3/uL (ref 0.1–1.0)
Monocytes Relative: 7 %
NEUTROS ABS: 1.4 10*3/uL — AB (ref 1.7–7.7)
Neutrophils Relative %: 80 %
Platelets: 149 10*3/uL — ABNORMAL LOW (ref 150–400)
RBC: 3.21 MIL/uL — ABNORMAL LOW (ref 4.22–5.81)
RDW: 14.6 % (ref 11.5–15.5)
WBC: 1.8 10*3/uL — ABNORMAL LOW (ref 4.0–10.5)

## 2017-11-14 LAB — CBC
HCT: 25.7 % — ABNORMAL LOW (ref 39.0–52.0)
Hemoglobin: 8.3 g/dL — ABNORMAL LOW (ref 13.0–17.0)
MCH: 26.4 pg (ref 26.0–34.0)
MCHC: 32.3 g/dL (ref 30.0–36.0)
MCV: 81.8 fL (ref 78.0–100.0)
PLATELETS: 123 10*3/uL — AB (ref 150–400)
RBC: 3.14 MIL/uL — ABNORMAL LOW (ref 4.22–5.81)
RDW: 14.7 % (ref 11.5–15.5)
WBC: 1.9 10*3/uL — AB (ref 4.0–10.5)

## 2017-11-14 LAB — CULTURE, BLOOD (ROUTINE X 2)
Culture: NO GROWTH
Culture: NO GROWTH
Special Requests: ADEQUATE

## 2017-11-14 NOTE — Progress Notes (Signed)
PROGRESS NOTE    Bradley Oliver. Footman  ZOX:[id number] DOB: 21-Nov-1955 DOA: 11/10/2017 PCP: Patient, No Pcp Per   Brief Narrative:  62 y.o. male with medical history significant of metastatic tonsil squamous cell carcinoma who presents with fever, throat pain, cough with green/gray sputum.  Assessment & Plan:  Principal Problem:   Neutropenic fever  - WBC levels continue to improve. Will reassess next am. - Continue cefepime and diflucan.  - Pt has numbing agent to help him consume his meals.  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 Disposition Plan:  With improvement in swallowing ability. Most likely next 1-2 days   Consultants:   None   Procedures: None   Antimicrobials: Cefepime   Subjective: No new complaints  Objective: Vitals:   11/13/17 1308 11/13/17 2327 11/14/17 0456 11/14/17 1510  BP: 119/76 121/88 106/73 103/73  Pulse: 82 78 72 71  Resp: 16 20 16 17   Temp: 99 F (37.2 C) 99.2 F (37.3 C) 98.7 F (37.1 C) 99.5 F (37.5 C)  TempSrc: Oral Oral Oral Oral  SpO2: 100% 100% 98% 99%  Weight:      Height:        Intake/Output Summary (Last 24 hours) at 11/14/2017 1617 Last data filed at 11/14/2017 8119 Gross per 24 hour  Intake 2506.67 ml  Output -  Net 2506.67 ml   Filed Weights   11/10/17 1146 11/10/17 1703  Weight: 49.9 kg (110 lb) 49.4 kg (109 lb)    Examination:  General exam: Pt in nad, alert and awake Cardiovascular system: S1 & S2 heard, RRR. No JVD, murmurs, rubs, gallops or clicks.  Respiratory system: cough, equal chest rise, no wheeze Central nervous system: Alert and oriented. No focal neurological deficits. Gastrointestinal system: Abdomen is nondistended, soft and nontender. No organomegaly or masses felt. Normal bowel sounds heard. Extremities:  Symmetric 5 x 5 power. Skin: No rashes, lesions or ulcers, on limited exam. Psychiatry:  Mood & affect appropriate.   Data Reviewed: I have personally reviewed following labs and imaging studies  CBC: Recent Labs  Lab 11/09/17 1828 11/10/17 1306 11/11/17 0339 11/13/17 0520 11/14/17 0355 11/14/17 1216  WBC 0.2* 0.4* 1.2* 2.0* 1.9* 1.8*  NEUTROABS 0.0* 0.2* 0.9* 1.6*  --  1.4*  HGB 11.2* 9.7* 9.4* 8.8* 8.3* 8.7*  HCT 33.8* 29.9* 28.9* 26.5* 25.7* 26.3*  MCV 81.8 84.5 83.5 82.8 81.8 81.9  PLT 161 129* 133* 112* 123* 147*   Basic Metabolic Panel: Recent Labs  Lab 11/09/17 1828 11/10/17 1306 11/11/17 0339  NA 134* 138 138  K 4.1 3.6 3.5  CL 94* 99* 104  CO2 27 26 24   GLUCOSE 135* 100* 82  BUN 41* 28* 29*  CREATININE 0.80 0.73 0.55*  CALCIUM 9.3 8.8* 8.5*   GFR: Estimated Creatinine Clearance: 67.8 mL/min (A) (by C-G formula based on SCr of 0.55 mg/dL (L)). Liver Function Tests: Recent Labs  Lab 11/09/17 1828  AST 39  ALT 38  ALKPHOS 70  BILITOT 1.2  PROT 7.6  ALBUMIN 3.3*   No results for input(s): LIPASE, AMYLASE in the last 168 hours. No results for input(s): AMMONIA in the last 168 hours. Coagulation Profile: No results for input(s): INR, PROTIME in the last 168 hours. Cardiac Enzymes: No results for input(s): CKTOTAL, CKMB, CKMBINDEX, TROPONINI in the last 168 hours. BNP (last 3 results) No  results for input(s): PROBNP in the last 8760 hours. HbA1C: No results for input(s): HGBA1C in the last 72 hours. CBG: No results for input(s): GLUCAP in the last 168 hours. Lipid Profile: No results for input(s): CHOL, HDL, LDLCALC, TRIG, CHOLHDL, LDLDIRECT in the last 72 hours. Thyroid Function Tests: No results for input(s): TSH, T4TOTAL, FREET4, T3FREE, THYROIDAB in the last 72 hours. Anemia Panel: No results for input(s): VITAMINB12, FOLATE, FERRITIN, TIBC, IRON, RETICCTPCT in the last 72 hours. Sepsis Labs: Recent Labs  Lab 11/10/17 1323  LATICACIDVEN  0.95    Recent Results (from the past 240 hour(s))  Culture, blood (routine x 2)     Status: None   Collection Time: 11/09/17  9:06 PM  Result Value Ref Range Status   Specimen Description   Final    BLOOD RIGHT ANTECUBITAL Performed at [facility] [address] [place], [place] [address]    Special Requests   Final    BOTTLES DRAWN AEROBIC AND ANAEROBIC Blood Culture results may not be optimal due to an excessive volume of blood received in culture bottles Performed at [facility] [address] [place], [place] [address]    Culture   Final    NO GROWTH 5 DAYS Performed at [facility], [place] [address], [place], [place] [address]    Report Status 11/14/2017 FINAL  Final  Culture, blood (routine x 2)     Status: None   Collection Time: 11/09/17  9:06 PM  Result Value Ref Range Status   Specimen Description   Final    BLOOD BLOOD LEFT WRIST Performed at [facility] [address], [place], [place] [address]    Special Requests   Final    BOTTLES DRAWN AEROBIC AND ANAEROBIC Blood Culture adequate volume Performed at [facility] [address], [place], [place] [address]    Culture   Final    NO GROWTH 5 DAYS Performed at [facility], [place] [address] [place], [place] [address]    Report Status 11/14/2017 FINAL  Final  Culture, blood (routine x 2)     Status: None (Preliminary result)   Collection Time: 11/10/17  1:07 PM  Result Value Ref Range Status   Specimen Description   Final    BLOOD LEFT ANTECUBITAL Performed at [facility] [address], [place], [place] [address]    Special Requests   Final    BOTTLES DRAWN AEROBIC AND ANAEROBIC Blood Culture results may not be optimal due to an excessive volume of blood received in culture bottles Performed at [facility] [address] [place], [place] [address]    Culture   Final    NO  GROWTH 4 DAYS Performed at [facility], [place] [address] [place], [place] [address]    Report Status PENDING  Incomplete  Culture, blood (routine x 2)     Status: None (Preliminary result)   Collection Time: 11/10/17  1:39 PM  Result Value Ref Range Status   Specimen Description   Final    BLOOD RIGHT FOREARM Performed at [facility] [address], [place], [place] [address]    Special Requests   Final    BOTTLES DRAWN AEROBIC AND ANAEROBIC Blood Culture adequate volume Performed at [facility] [address], [place],  [address]    Culture   Final    NO GROWTH 4 DAYS Performed  at [facility], [place] [address] [place], [place] [address]    Report Status PENDING  Incomplete  Culture, group A strep     Status: None   Collection Time: 11/10/17  7:38 PM  Result Value Ref Range Status   Specimen Description   Final    THROAT Performed at [facility], [place] [address], [place], [place] [address]    Special Requests   Final    NONE Performed at [facility], [place] [address], [place], [place] [address]    Culture   Final    NO GROUP A STREP (S.PYOGENES) ISOLATED Performed at [facility], [place] [address], [place], [place] [address]    Report Status 11/13/2017 FINAL  Final     Radiology Studies: No results found. Scheduled Meds: . enoxaparin (LOVENOX) injection  40 mg Subcutaneous Q24H  . fluconazole  100 mg Oral Daily   Continuous Infusions: . sodium chloride 100 mL/hr at 11/11/17 1604  . ceFEPime (MAXIPIME) IV 2 g (11/14/17 1504)     LOS: 4 days    Time spent: 61 min  Velvet Bathe, MD [facility] Pager [phone]  If 7PM-7AM, please contact night-coverage [email or web address] Password [facility] 11/14/2017, 4:17 PM

## 2017-11-14 NOTE — Progress Notes (Signed)
CM consult for PCP options. This CM informed pt that he would need to call the number on the back of his insurance card to find out who takes his insurance plan in this area. Pt states that a referral is supposed to be made by the MD here at the hospital to a new Oncologist in [place]. He was treated in [place] previously. Marney Doctor RN,BSN,NCM [phone]

## 2017-11-15 DIAGNOSIS — D649 Anemia, unspecified: Secondary | ICD-10-CM

## 2017-11-15 DIAGNOSIS — C099 Malignant neoplasm of tonsil, unspecified: Secondary | ICD-10-CM

## 2017-11-15 LAB — CULTURE, BLOOD (ROUTINE X 2)
Culture: NO GROWTH
Culture: NO GROWTH
SPECIAL REQUESTS: ADEQUATE

## 2017-11-15 MED ORDER — CEFDINIR 300 MG PO CAPS
300.0000 mg | ORAL_CAPSULE | Freq: Two times a day (BID) | ORAL | Status: DC
  Administered 2017-11-15: 300 mg via ORAL
  Filled 2017-11-15: qty 1

## 2017-11-15 MED ORDER — CEFDINIR 300 MG PO CAPS: 300.0000 mg | ORAL_CAPSULE | Freq: Two times a day (BID) | ORAL | 0 refills | Status: DC

## 2017-11-15 NOTE — Discharge Instructions (Signed)
Neutropenia Neutropenia is a condition that occurs when you have a lower-than-normal level of a type of white blood cell (neutrophil) in your body. Neutrophils are made in the spongy center of large bones (bone marrow) and they fight infections. Neutrophils are your body's main defense against bacterial and fungal infections. The fewer neutrophils you have and the longer your body remains without them, the greater your risk of getting a severe infection. What are the causes? This condition can occur if your body uses up or destroys neutrophils faster than your bone marrow can make them. This problem may happen because of:  Bacterial or fungal infection.  Allergic disorders.  Reactions to some medicines.  Autoimmune disease.  An enlarged spleen.  This condition can also occur if your bone marrow does not produce enough neutrophils. This problem may be caused by:  Cancer.  Cancer treatments, such as radiation or chemotherapy.  Viral infections.  Medicines, such as phenytoin.  Vitamin B12 deficiency.  Diseases of the bone marrow.  Environmental toxins, such as insecticides.  What are the signs or symptoms? This condition does not usually cause symptoms. If symptoms are present, they are usually caused by an underlying infection. Symptoms of an infection may include:  Fever.  Chills.  Swollen glands.  Oral or anal ulcers.  Cough and shortness of breath.  Rash.  Skin infection.  Fatigue.  How is this diagnosed? Your health care provider may suspect neutropenia if you have:  A condition that may cause neutropenia.  Symptoms of infection, especially fever.  Frequent and unusual infections.  You will have a medical history and physical exam. Tests will also be done, such as:  A complete blood count (CBC).  A procedure to collect a sample of bone marrow for examination (bone marrow biopsy).  A chest X-ray.  A urine culture.  A blood culture.  How is this  treated? Treatment depends on the underlying cause and severity of your condition. Mild neutropenia may not require treatment. Treatment may include medicines, such as:  Antibiotic medicine given through an IV tube.  Antiviral medicines.  Antifungal medicines.  A medicine to increase neutrophil production (colony-stimulating factor). You may get this drug through an IV tube or by injection.  Steroids given through an IV tube.  If an underlying condition is causing neutropenia, you may need treatment for that condition. If medicines you are taking are causing neutropenia, your health care provider may have you stop taking those medicines. Follow these instructions at home: Medicines  Take over-the-counter and prescription medicines only as told by your health care provider.  Get a seasonal flu shot (influenza vaccine). Lifestyle  Do not eat unpasteurized foods.Do not eat unwashed raw fruits or vegetables.  Avoid exposure to groups of people or children.  Avoid being around people who are sick.  Avoid being around dirt or dust, such as in construction areas or gardens.  Do not provide direct care for pets. Avoid animal droppings. Do not clean litter boxes and bird cages. Hygiene   Bathe daily.  Clean the area between the genitals and the anus (perineal area) after you urinate or have a bowel movement. If you are male, wipe from front to back.  Brush your teeth with a soft toothbrush before and after meals.  Do not use a razor that has a blade. Use an electric razor to remove hair.  Wash your hands often. Make sure others who come in contact with you also wash their hands. If soap and water   are not available, use hand sanitizer. General instructions  Do not have sex unless your health care provider has approved.  Take actions to avoid cuts and burns. For example: ? Be cautious when you use knives. Always cut away from yourself. ? Keep knives in protective sheaths or  guards when not in use. ? Use oven mitts when you cook with a hot stove, oven, or grill. ? Stand a safe distance away from open fires.  Avoid people who received a vaccine in the past 30 days if that vaccine contained a live version of the germ (live vaccine). You should not get a live vaccine. Common live vaccines are varicella, measles, mumps, and rubella.  Do not share food utensils.  Do not use tampons, enemas, or rectal suppositories unless your health care provider has approved.  Keep all appointments as told by your health care provider. This is important. Contact a health care provider if:  You have a fever.  You have chills or you start to shake.  You have: ? A sore throat. ? A warm, red, or tender area on your skin. ? A cough. ? Frequent or painful urination. ? Vaginal discharge or itching.  You develop: ? Sores in your mouth or anus. ? Swollen lymph nodes. ? Red streaks on the skin. ? A rash.  You feel: ? Nauseous or you vomit. ? Very fatigued. ? Short of breath. This information is not intended to replace advice given to you by your health care provider. Make sure you discuss any questions you have with your health care provider. Document Released: 10/29/2001 Document Revised: 10/15/2015 Document Reviewed: 11/19/2014 Elsevier Interactive Patient Education  2018 [employer]  

## 2017-11-15 NOTE — Discharge Summary (Addendum)
Physician Discharge Summary  Bradley Oliver. Tobin Chad WEX:[id number] DOB: [birth year] DOA: 11/10/2017  PCP: Patient, No Pcp Per  Admit date: 11/10/2017 Discharge date: 11/15/2017  Time spent: 45 minutes  Recommendations for Outpatient Follow-up:  Patient will be discharged to home.  Patient will need to follow up with primary care provider within one week of discharge.  Follow up with your oncologist at [facility]. Patient should continue medications as prescribed.  Patient should follow a liquid diet.   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 Lorel Monaco. Bradley Oliver is a 62 y.o. male with medical history significant of metastatic tonsil squamous cell carcinoma who presents with fever, throat pain, cough with green/gray sputum.  He presented to the emergency department yesterday 6/20.  At that time, he had no fevers.  Case was discussed by EDP with [facility].  They recommended starting patient on Augmentin and Diflucan and to monitor for fevers at home due to his neutropenia.  He has not yet filled those prescriptions. He now returns to the emergency department today after having a fever at home of 102.  He denies any chest pain, abdominal pain, nausea, vomiting, diarrhea.  He is currently undergoing chemotherapy at [facility] oncology.  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 -Follows with [facility], currently undergoing chemotherapy  -Has follow up in July   Normocytic anemia -last hemoglobin at [facility] was 10.6 on 10/26/2017 -hemoglobin currently 8.7 and has remained stable during hospitalization  Severe protein calorie malnutrition -Nutrition consulted, continue supplements  Procedures: None  Consultations: None  Code status: DNR  Discharge Exam: Vitals:   11/15/17 0637 11/15/17 0758  BP: 106/73   Pulse: 69   Resp: 20   Temp: 98.7 F (37.1 C) 98.7 F (37.1 C)  SpO2: 99%    Patient feels he is improving but complains about liquid diet but feels he is able to control his diet. Denies chest pain, shortness of breath, abdominal pain, nausea, vomiting, diarrhea, constipation.    General: Well developed, chronically ill appearing, NAD  HEENT: NCAT, mucous membranes moist.  Neck: Supple  Cardiovascular: S1 S2 auscultated, no rubs, murmurs or gallops. Regular rate and rhythm.  Respiratory: Clear to auscultation bilaterally with equal chest rise  Abdomen: Soft, nontender, nondistended, + bowel sounds  Extremities: warm dry without cyanosis clubbing or edema  Neuro: AAOx3, nonfocal   Psych: Normal affect and demeanor with intact judgement and insight  Discharge Instructions Discharge Instructions    Discharge instructions   Complete by:  As directed    Patient will be discharged to home.  Patient will need to follow up with primary care provider within one week of discharge.  Follow up with your oncologist at [facility]. Patient should continue medications as prescribed.  Patient should follow a liquid diet.     Allergies  as of 11/15/2017   No Known Allergies     Medication List    STOP taking these medications   amoxicillin-clavulanate 875-125 MG tablet Commonly known as:  AUGMENTIN     TAKE these medications   albuterol 108 (90  Base) MCG/ACT inhaler Commonly known as:  PROVENTIL HFA;VENTOLIN HFA USE 2 PUFFS 4 TIMES A DAY AS NEEDED SOB AND WHEEZING   benzocaine 20 % oral spray Commonly known as:  HURRICAINE Use as directed 1 application in the mouth or throat 4 (four) times daily as needed for throat irritation / pain.   cefdinir 300 MG capsule Commonly known as:  OMNICEF Take 1 capsule (300 mg total) by mouth every 12 (twelve) hours. Start taking on:  11/19/2017   dexamethasone 4 MG tablet Commonly known as:  DECADRON TAKE 2 TABLETS AT 8AM 1 DAY BEFORE CHEMO, 2 TABS DAY AFTER CHEMO AND 1 TAB 2ND DAY AFTER CHEMO   fluconazole 10 MG/ML suspension Commonly known as:  DIFLUCAN Take 10 mLs (100 mg total) by mouth daily for 7 days.   MULTIVITAMIN ADULT Tabs Take 1 tablet by mouth daily.   prochlorperazine 10 MG tablet Commonly known as:  COMPAZINE TAKE 1 TABLET EVERY 6 HOURS AS NEEDED FOR NAUSEA OR VOMITING   ranitidine 150 MG tablet Commonly known as:  ZANTAC Take by mouth.      No Known Allergies    The results of significant diagnostics from this hospitalization (including imaging, microbiology, ancillary and laboratory) are listed below for reference.    Significant Diagnostic Studies: Dg Chest 2 View  Result Date: 11/09/2017 CLINICAL DATA:  Pt reports hemoptysis starting around 230p. He also reports green/gray "bit" coming up on his tongue. He has a hx of tonsil cancer and metastatic lung cancer. He also endorses body aches and fatigue. AANDOx4. ex-smoker. EXAM: CHEST - 2 VIEW COMPARISON:  None. FINDINGS: Heart size is normal. There is LEFT hilar fullness and parenchymal opacity extending to the posterior LEFT UPPER lobe, suspicious for mass and/or consolidation. No pulmonary edema. RIGHT lung is clear. IMPRESSION: Suspect LEFT hilar mass and possible obstructive pneumonia or atelectasis in the LEFT UPPER lobe. Comparison prior studies would be helpful if they are available. Consider CT of the chest  as needed. Electronically Signed   By: Nolon Nations M.D.   On: 11/09/2017 18:47    Microbiology: Recent Results (from the past 240 hour(s))  Culture, blood (routine x 2)     Status: None   Collection Time: 11/09/17  9:06 PM  Result Value Ref Range Status   Specimen Description   Final    BLOOD RIGHT ANTECUBITAL Performed at [facility] [address] [place], [place] [address]    Special Requests   Final    BOTTLES DRAWN AEROBIC AND ANAEROBIC Blood Culture results may not be optimal due to an excessive volume of blood received in culture bottles Performed at [facility] [address], [place], [place] [address]    Culture   Final    NO GROWTH 5 DAYS Performed at [facility], [place] [address], [place], [place] [address]    Report Status 11/14/2017 FINAL  Final  Culture, blood (routine x 2)     Status: None   Collection Time: 11/09/17  9:06 PM  Result Value Ref Range Status   Specimen Description   Final    BLOOD BLOOD LEFT WRIST Performed at [facility] [address] [place], [place]  [address]    Special Requests   Final    BOTTLES DRAWN AEROBIC AND ANAEROBIC Blood Culture adequate volume Performed at [facility] [address] [place], [place] [address]    Culture   Final    NO GROWTH 5 DAYS Performed at [facility], [place] [address] [place], [place] [address]    Report Status 11/14/2017 FINAL  Final  Culture, blood (routine x 2)     Status: None   Collection Time: 11/10/17  1:07 PM  Result Value Ref Range Status   Specimen Description   Final    BLOOD LEFT ANTECUBITAL Performed at [facility] [address] [place], [place] [address]    Special Requests   Final    BOTTLES DRAWN AEROBIC AND ANAEROBIC Blood Culture results may not be optimal due to an excessive volume of blood received in culture bottles Performed at [facility] [address] [place], [place] [address]    Culture   Final    NO GROWTH 5 DAYS Performed at [facility], [place] [address] [place], [place] [address]    Report Status 11/15/2017 FINAL  Final  Culture, blood (routine x 2)     Status: None   Collection Time: 11/10/17  1:39 PM  Result Value Ref Range Status   Specimen Description   Final    BLOOD RIGHT FOREARM Performed at [facility] [address] [place], [place] [address]    Special Requests   Final    BOTTLES DRAWN AEROBIC AND ANAEROBIC Blood Culture adequate volume Performed at [facility] [address], [place], [place] [address]    Culture   Final    NO GROWTH 5 DAYS Performed at [facility], [place] [address], [place], [place] [address]    Report Status 11/15/2017 FINAL  Final  Culture, group A strep     Status: None   Collection Time: 11/10/17  7:38 PM  Result Value Ref Range Status   Specimen Description   Final    THROAT Performed at [facility], [place] [address], [place], [place] [address]    Special Requests   Final    NONE Performed at [facility], [place] [address], [place], [place] [address]    Culture   Final    NO GROUP A STREP (S.PYOGENES) ISOLATED Performed at [facility], [place] [address] [place], [place] [address]    Report Status 11/13/2017 FINAL  Final     Labs: Basic Metabolic Panel: Recent Labs  Lab 11/09/17 1828 11/10/17 1306 11/11/17 0339  NA 134* 138 138  K 4.1 3.6 3.5  CL 94* 99* 104  CO2 27 26 24   GLUCOSE 135* 100* 82  BUN 41* 28* 29*  CREATININE 0.80 0.73 0.55*  CALCIUM 9.3 8.8* 8.5*   Liver Function Tests: Recent Labs  Lab 11/09/17 1828  AST 39  ALT 38  ALKPHOS 70  BILITOT 1.2  PROT 7.6  ALBUMIN 3.3*   No results for input(s): LIPASE, AMYLASE in the last 168 hours. No results for input(s): AMMONIA in the last 168 hours. CBC: Recent Labs  Lab 11/09/17 1828  11/10/17 1306 11/11/17 0339 11/13/17 0520 11/14/17 0355 11/14/17 1216  WBC 0.2* 0.4* 1.2* 2.0* 1.9* 1.8*  NEUTROABS 0.0* 0.2* 0.9* 1.6*  --  1.4*  HGB 11.2* 9.7* 9.4* 8.8* 8.3* 8.7*  HCT 33.8* 29.9* 28.9* 26.5* 25.7*  26.3*  MCV 81.8 84.5 83.5 82.8 81.8 81.9  PLT 161 129* 133* 112* 123* 149*   Cardiac Enzymes: No results for input(s): CKTOTAL, CKMB, CKMBINDEX, TROPONINI in the last 168 hours. BNP: BNP (last 3 results) No results for input(s): BNP in the last 8760 hours.  ProBNP (last 3 results) No results for input(s): PROBNP in the last 8760 hours.  CBG: No results for input(s): GLUCAP in the last 168 hours.     Signed:  Cristal Ford  [facility] 11/15/2017, 2:14 PM

## 2017-11-20 DIAGNOSIS — D6481 Anemia due to antineoplastic chemotherapy: Secondary | ICD-10-CM | POA: Diagnosis not present

## 2017-11-20 DIAGNOSIS — C770 Secondary and unspecified malignant neoplasm of lymph nodes of head, face and neck: Secondary | ICD-10-CM | POA: Diagnosis not present

## 2017-11-20 DIAGNOSIS — T451X5A Adverse effect of antineoplastic and immunosuppressive drugs, initial encounter: Secondary | ICD-10-CM | POA: Diagnosis not present

## 2017-11-20 DIAGNOSIS — C3412 Malignant neoplasm of upper lobe, left bronchus or lung: Secondary | ICD-10-CM | POA: Diagnosis not present

## 2017-11-20 DIAGNOSIS — C7800 Secondary malignant neoplasm of unspecified lung: Secondary | ICD-10-CM | POA: Diagnosis not present

## 2017-11-20 DIAGNOSIS — Z5111 Encounter for antineoplastic chemotherapy: Secondary | ICD-10-CM | POA: Diagnosis not present

## 2017-11-20 DIAGNOSIS — C099 Malignant neoplasm of tonsil, unspecified: Secondary | ICD-10-CM | POA: Diagnosis not present

## 2017-11-20 DIAGNOSIS — K1232 Oral mucositis (ulcerative) due to other drugs: Secondary | ICD-10-CM | POA: Diagnosis not present

## 2017-11-20 DIAGNOSIS — D708 Other neutropenia: Secondary | ICD-10-CM | POA: Diagnosis not present

## 2017-11-20 DIAGNOSIS — Z5112 Encounter for antineoplastic immunotherapy: Secondary | ICD-10-CM | POA: Diagnosis not present

## 2017-11-20 DIAGNOSIS — L89159 Pressure ulcer of sacral region, unspecified stage: Secondary | ICD-10-CM | POA: Diagnosis not present

## 2017-11-22 DIAGNOSIS — Z87891 Personal history of nicotine dependence: Secondary | ICD-10-CM | POA: Diagnosis not present

## 2017-11-22 DIAGNOSIS — C7800 Secondary malignant neoplasm of unspecified lung: Secondary | ICD-10-CM | POA: Diagnosis not present

## 2017-11-22 DIAGNOSIS — Z5111 Encounter for antineoplastic chemotherapy: Secondary | ICD-10-CM | POA: Diagnosis not present

## 2017-11-22 DIAGNOSIS — R918 Other nonspecific abnormal finding of lung field: Secondary | ICD-10-CM | POA: Diagnosis not present

## 2017-11-22 DIAGNOSIS — C3412 Malignant neoplasm of upper lobe, left bronchus or lung: Secondary | ICD-10-CM | POA: Diagnosis not present

## 2017-11-22 DIAGNOSIS — C770 Secondary and unspecified malignant neoplasm of lymph nodes of head, face and neck: Secondary | ICD-10-CM | POA: Diagnosis not present

## 2017-11-22 DIAGNOSIS — C099 Malignant neoplasm of tonsil, unspecified: Secondary | ICD-10-CM | POA: Diagnosis not present

## 2017-11-22 DIAGNOSIS — Z5112 Encounter for antineoplastic immunotherapy: Secondary | ICD-10-CM | POA: Diagnosis not present

## 2017-11-24 DIAGNOSIS — Z923 Personal history of irradiation: Secondary | ICD-10-CM | POA: Diagnosis not present

## 2017-11-24 DIAGNOSIS — C099 Malignant neoplasm of tonsil, unspecified: Secondary | ICD-10-CM | POA: Diagnosis not present

## 2017-11-24 DIAGNOSIS — C3412 Malignant neoplasm of upper lobe, left bronchus or lung: Secondary | ICD-10-CM | POA: Diagnosis not present

## 2017-11-24 DIAGNOSIS — C140 Malignant neoplasm of pharynx, unspecified: Secondary | ICD-10-CM | POA: Diagnosis not present

## 2017-11-24 DIAGNOSIS — C770 Secondary and unspecified malignant neoplasm of lymph nodes of head, face and neck: Secondary | ICD-10-CM | POA: Diagnosis not present

## 2017-12-13 DIAGNOSIS — C770 Secondary and unspecified malignant neoplasm of lymph nodes of head, face and neck: Secondary | ICD-10-CM | POA: Diagnosis not present

## 2017-12-13 DIAGNOSIS — R05 Cough: Secondary | ICD-10-CM | POA: Diagnosis not present

## 2017-12-13 DIAGNOSIS — Z5111 Encounter for antineoplastic chemotherapy: Secondary | ICD-10-CM | POA: Diagnosis not present

## 2017-12-13 DIAGNOSIS — Z5112 Encounter for antineoplastic immunotherapy: Secondary | ICD-10-CM | POA: Diagnosis not present

## 2017-12-13 DIAGNOSIS — C7802 Secondary malignant neoplasm of left lung: Secondary | ICD-10-CM | POA: Diagnosis not present

## 2017-12-13 DIAGNOSIS — Z85818 Personal history of malignant neoplasm of other sites of lip, oral cavity, and pharynx: Secondary | ICD-10-CM | POA: Diagnosis not present

## 2017-12-13 DIAGNOSIS — C099 Malignant neoplasm of tonsil, unspecified: Secondary | ICD-10-CM | POA: Diagnosis not present

## 2017-12-13 DIAGNOSIS — C3412 Malignant neoplasm of upper lobe, left bronchus or lung: Secondary | ICD-10-CM | POA: Diagnosis not present

## 2017-12-15 DIAGNOSIS — C140 Malignant neoplasm of pharynx, unspecified: Secondary | ICD-10-CM | POA: Diagnosis not present

## 2017-12-15 DIAGNOSIS — Z923 Personal history of irradiation: Secondary | ICD-10-CM | POA: Diagnosis not present

## 2017-12-15 DIAGNOSIS — C3412 Malignant neoplasm of upper lobe, left bronchus or lung: Secondary | ICD-10-CM | POA: Diagnosis not present

## 2017-12-15 DIAGNOSIS — C099 Malignant neoplasm of tonsil, unspecified: Secondary | ICD-10-CM | POA: Diagnosis not present

## 2018-01-03 DIAGNOSIS — Z87891 Personal history of nicotine dependence: Secondary | ICD-10-CM | POA: Diagnosis not present

## 2018-01-03 DIAGNOSIS — Z5112 Encounter for antineoplastic immunotherapy: Secondary | ICD-10-CM | POA: Diagnosis not present

## 2018-01-03 DIAGNOSIS — Z5111 Encounter for antineoplastic chemotherapy: Secondary | ICD-10-CM | POA: Diagnosis not present

## 2018-01-03 DIAGNOSIS — C140 Malignant neoplasm of pharynx, unspecified: Secondary | ICD-10-CM | POA: Diagnosis not present

## 2018-01-03 DIAGNOSIS — C770 Secondary and unspecified malignant neoplasm of lymph nodes of head, face and neck: Secondary | ICD-10-CM | POA: Diagnosis not present

## 2018-01-03 DIAGNOSIS — Z923 Personal history of irradiation: Secondary | ICD-10-CM | POA: Diagnosis not present

## 2018-01-03 DIAGNOSIS — C099 Malignant neoplasm of tonsil, unspecified: Secondary | ICD-10-CM | POA: Diagnosis not present

## 2018-01-03 DIAGNOSIS — C7800 Secondary malignant neoplasm of unspecified lung: Secondary | ICD-10-CM | POA: Diagnosis not present

## 2018-01-03 DIAGNOSIS — C3412 Malignant neoplasm of upper lobe, left bronchus or lung: Secondary | ICD-10-CM | POA: Diagnosis not present

## 2018-01-18 DIAGNOSIS — C099 Malignant neoplasm of tonsil, unspecified: Secondary | ICD-10-CM | POA: Diagnosis not present

## 2018-01-18 DIAGNOSIS — C770 Secondary and unspecified malignant neoplasm of lymph nodes of head, face and neck: Secondary | ICD-10-CM | POA: Diagnosis not present

## 2018-01-18 DIAGNOSIS — Z9221 Personal history of antineoplastic chemotherapy: Secondary | ICD-10-CM | POA: Diagnosis not present

## 2018-01-18 DIAGNOSIS — C3412 Malignant neoplasm of upper lobe, left bronchus or lung: Secondary | ICD-10-CM | POA: Diagnosis not present

## 2018-01-18 DIAGNOSIS — Z923 Personal history of irradiation: Secondary | ICD-10-CM | POA: Diagnosis not present

## 2018-01-24 DIAGNOSIS — Z5112 Encounter for antineoplastic immunotherapy: Secondary | ICD-10-CM | POA: Diagnosis not present

## 2018-01-24 DIAGNOSIS — Z923 Personal history of irradiation: Secondary | ICD-10-CM | POA: Diagnosis not present

## 2018-01-24 DIAGNOSIS — J3800 Paralysis of vocal cords and larynx, unspecified: Secondary | ICD-10-CM | POA: Diagnosis not present

## 2018-01-24 DIAGNOSIS — C099 Malignant neoplasm of tonsil, unspecified: Secondary | ICD-10-CM | POA: Diagnosis not present

## 2018-01-24 DIAGNOSIS — C140 Malignant neoplasm of pharynx, unspecified: Secondary | ICD-10-CM | POA: Diagnosis not present

## 2018-01-24 DIAGNOSIS — Z5111 Encounter for antineoplastic chemotherapy: Secondary | ICD-10-CM | POA: Diagnosis not present

## 2018-01-24 DIAGNOSIS — R490 Dysphonia: Secondary | ICD-10-CM | POA: Diagnosis not present

## 2018-01-24 DIAGNOSIS — R634 Abnormal weight loss: Secondary | ICD-10-CM | POA: Diagnosis not present

## 2018-01-24 DIAGNOSIS — Z79899 Other long term (current) drug therapy: Secondary | ICD-10-CM | POA: Diagnosis not present

## 2018-01-24 DIAGNOSIS — C3412 Malignant neoplasm of upper lobe, left bronchus or lung: Secondary | ICD-10-CM | POA: Diagnosis not present

## 2018-01-24 DIAGNOSIS — R05 Cough: Secondary | ICD-10-CM | POA: Diagnosis not present

## 2018-01-24 DIAGNOSIS — Z87891 Personal history of nicotine dependence: Secondary | ICD-10-CM | POA: Diagnosis not present

## 2018-01-24 DIAGNOSIS — C770 Secondary and unspecified malignant neoplasm of lymph nodes of head, face and neck: Secondary | ICD-10-CM | POA: Diagnosis not present

## 2018-01-24 DIAGNOSIS — R1310 Dysphagia, unspecified: Secondary | ICD-10-CM | POA: Diagnosis not present

## 2018-01-31 DIAGNOSIS — R590 Localized enlarged lymph nodes: Secondary | ICD-10-CM | POA: Diagnosis not present

## 2018-01-31 DIAGNOSIS — Z5112 Encounter for antineoplastic immunotherapy: Secondary | ICD-10-CM | POA: Diagnosis not present

## 2018-01-31 DIAGNOSIS — C3412 Malignant neoplasm of upper lobe, left bronchus or lung: Secondary | ICD-10-CM | POA: Diagnosis not present

## 2018-01-31 DIAGNOSIS — R918 Other nonspecific abnormal finding of lung field: Secondary | ICD-10-CM | POA: Diagnosis not present

## 2018-01-31 DIAGNOSIS — C770 Secondary and unspecified malignant neoplasm of lymph nodes of head, face and neck: Secondary | ICD-10-CM | POA: Diagnosis not present

## 2018-01-31 DIAGNOSIS — C099 Malignant neoplasm of tonsil, unspecified: Secondary | ICD-10-CM | POA: Diagnosis not present

## 2018-01-31 DIAGNOSIS — Z5111 Encounter for antineoplastic chemotherapy: Secondary | ICD-10-CM | POA: Diagnosis not present

## 2018-01-31 DIAGNOSIS — C7801 Secondary malignant neoplasm of right lung: Secondary | ICD-10-CM | POA: Diagnosis not present

## 2018-01-31 DIAGNOSIS — Z923 Personal history of irradiation: Secondary | ICD-10-CM | POA: Diagnosis not present

## 2018-02-14 DIAGNOSIS — C3412 Malignant neoplasm of upper lobe, left bronchus or lung: Secondary | ICD-10-CM | POA: Diagnosis not present

## 2018-02-14 DIAGNOSIS — J3800 Paralysis of vocal cords and larynx, unspecified: Secondary | ICD-10-CM | POA: Diagnosis not present

## 2018-02-14 DIAGNOSIS — R1310 Dysphagia, unspecified: Secondary | ICD-10-CM | POA: Diagnosis not present

## 2018-02-14 DIAGNOSIS — C770 Secondary and unspecified malignant neoplasm of lymph nodes of head, face and neck: Secondary | ICD-10-CM | POA: Diagnosis not present

## 2018-02-14 DIAGNOSIS — Z923 Personal history of irradiation: Secondary | ICD-10-CM | POA: Diagnosis not present

## 2018-02-14 DIAGNOSIS — Z5181 Encounter for therapeutic drug level monitoring: Secondary | ICD-10-CM | POA: Diagnosis not present

## 2018-02-14 DIAGNOSIS — Z87891 Personal history of nicotine dependence: Secondary | ICD-10-CM | POA: Diagnosis not present

## 2018-02-14 DIAGNOSIS — C7802 Secondary malignant neoplasm of left lung: Secondary | ICD-10-CM | POA: Diagnosis not present

## 2018-02-14 DIAGNOSIS — R490 Dysphonia: Secondary | ICD-10-CM | POA: Diagnosis not present

## 2018-02-14 DIAGNOSIS — Z79899 Other long term (current) drug therapy: Secondary | ICD-10-CM | POA: Diagnosis not present

## 2018-02-14 DIAGNOSIS — Z5112 Encounter for antineoplastic immunotherapy: Secondary | ICD-10-CM | POA: Diagnosis not present

## 2018-02-14 DIAGNOSIS — Z224 Carrier of infections with a predominantly sexual mode of transmission: Secondary | ICD-10-CM | POA: Diagnosis not present

## 2018-02-14 DIAGNOSIS — C099 Malignant neoplasm of tonsil, unspecified: Secondary | ICD-10-CM | POA: Diagnosis not present

## 2018-02-14 DIAGNOSIS — C140 Malignant neoplasm of pharynx, unspecified: Secondary | ICD-10-CM | POA: Diagnosis not present

## 2018-02-14 DIAGNOSIS — R05 Cough: Secondary | ICD-10-CM | POA: Diagnosis not present

## 2018-02-14 DIAGNOSIS — C7801 Secondary malignant neoplasm of right lung: Secondary | ICD-10-CM | POA: Diagnosis not present

## 2018-03-07 DIAGNOSIS — R1310 Dysphagia, unspecified: Secondary | ICD-10-CM | POA: Diagnosis not present

## 2018-03-07 DIAGNOSIS — C7801 Secondary malignant neoplasm of right lung: Secondary | ICD-10-CM | POA: Diagnosis not present

## 2018-03-07 DIAGNOSIS — R634 Abnormal weight loss: Secondary | ICD-10-CM | POA: Diagnosis not present

## 2018-03-07 DIAGNOSIS — Z923 Personal history of irradiation: Secondary | ICD-10-CM | POA: Diagnosis not present

## 2018-03-07 DIAGNOSIS — Z5112 Encounter for antineoplastic immunotherapy: Secondary | ICD-10-CM | POA: Diagnosis not present

## 2018-03-07 DIAGNOSIS — C3412 Malignant neoplasm of upper lobe, left bronchus or lung: Secondary | ICD-10-CM | POA: Diagnosis not present

## 2018-03-07 DIAGNOSIS — Z87891 Personal history of nicotine dependence: Secondary | ICD-10-CM | POA: Diagnosis not present

## 2018-03-07 DIAGNOSIS — C770 Secondary and unspecified malignant neoplasm of lymph nodes of head, face and neck: Secondary | ICD-10-CM | POA: Diagnosis not present

## 2018-03-07 DIAGNOSIS — L89159 Pressure ulcer of sacral region, unspecified stage: Secondary | ICD-10-CM | POA: Diagnosis not present

## 2018-03-07 DIAGNOSIS — C099 Malignant neoplasm of tonsil, unspecified: Secondary | ICD-10-CM | POA: Diagnosis not present

## 2018-03-21 DIAGNOSIS — C024 Malignant neoplasm of lingual tonsil: Secondary | ICD-10-CM | POA: Diagnosis not present

## 2018-03-21 DIAGNOSIS — C3490 Malignant neoplasm of unspecified part of unspecified bronchus or lung: Secondary | ICD-10-CM | POA: Diagnosis not present

## 2018-03-21 DIAGNOSIS — Z23 Encounter for immunization: Secondary | ICD-10-CM | POA: Diagnosis not present

## 2018-03-28 DIAGNOSIS — C099 Malignant neoplasm of tonsil, unspecified: Secondary | ICD-10-CM | POA: Diagnosis not present

## 2018-03-28 DIAGNOSIS — Z923 Personal history of irradiation: Secondary | ICD-10-CM | POA: Diagnosis not present

## 2018-03-28 DIAGNOSIS — Z79899 Other long term (current) drug therapy: Secondary | ICD-10-CM | POA: Diagnosis not present

## 2018-03-28 DIAGNOSIS — C140 Malignant neoplasm of pharynx, unspecified: Secondary | ICD-10-CM | POA: Diagnosis not present

## 2018-03-28 DIAGNOSIS — Z8249 Family history of ischemic heart disease and other diseases of the circulatory system: Secondary | ICD-10-CM | POA: Diagnosis not present

## 2018-03-28 DIAGNOSIS — R200 Anesthesia of skin: Secondary | ICD-10-CM | POA: Diagnosis not present

## 2018-03-28 DIAGNOSIS — C3412 Malignant neoplasm of upper lobe, left bronchus or lung: Secondary | ICD-10-CM | POA: Diagnosis not present

## 2018-03-28 DIAGNOSIS — Z87891 Personal history of nicotine dependence: Secondary | ICD-10-CM | POA: Diagnosis not present

## 2018-03-28 DIAGNOSIS — R1310 Dysphagia, unspecified: Secondary | ICD-10-CM | POA: Diagnosis not present

## 2018-03-28 DIAGNOSIS — Z833 Family history of diabetes mellitus: Secondary | ICD-10-CM | POA: Diagnosis not present

## 2018-03-28 DIAGNOSIS — Z5112 Encounter for antineoplastic immunotherapy: Secondary | ICD-10-CM | POA: Diagnosis not present

## 2018-03-28 DIAGNOSIS — C770 Secondary and unspecified malignant neoplasm of lymph nodes of head, face and neck: Secondary | ICD-10-CM | POA: Diagnosis not present

## 2018-04-03 DIAGNOSIS — R1110 Vomiting, unspecified: Secondary | ICD-10-CM | POA: Diagnosis not present

## 2018-04-03 DIAGNOSIS — C760 Malignant neoplasm of head, face and neck: Secondary | ICD-10-CM | POA: Diagnosis not present

## 2018-04-18 DIAGNOSIS — C770 Secondary and unspecified malignant neoplasm of lymph nodes of head, face and neck: Secondary | ICD-10-CM | POA: Diagnosis not present

## 2018-04-18 DIAGNOSIS — C7802 Secondary malignant neoplasm of left lung: Secondary | ICD-10-CM | POA: Diagnosis not present

## 2018-04-18 DIAGNOSIS — Z8249 Family history of ischemic heart disease and other diseases of the circulatory system: Secondary | ICD-10-CM | POA: Diagnosis not present

## 2018-04-18 DIAGNOSIS — L89152 Pressure ulcer of sacral region, stage 2: Secondary | ICD-10-CM | POA: Diagnosis not present

## 2018-04-18 DIAGNOSIS — Z833 Family history of diabetes mellitus: Secondary | ICD-10-CM | POA: Diagnosis not present

## 2018-04-18 DIAGNOSIS — Z87891 Personal history of nicotine dependence: Secondary | ICD-10-CM | POA: Diagnosis not present

## 2018-04-18 DIAGNOSIS — Z923 Personal history of irradiation: Secondary | ICD-10-CM | POA: Diagnosis not present

## 2018-04-18 DIAGNOSIS — C7801 Secondary malignant neoplasm of right lung: Secondary | ICD-10-CM | POA: Diagnosis not present

## 2018-04-18 DIAGNOSIS — R200 Anesthesia of skin: Secondary | ICD-10-CM | POA: Diagnosis not present

## 2018-04-18 DIAGNOSIS — C3412 Malignant neoplasm of upper lobe, left bronchus or lung: Secondary | ICD-10-CM | POA: Diagnosis not present

## 2018-04-18 DIAGNOSIS — C099 Malignant neoplasm of tonsil, unspecified: Secondary | ICD-10-CM | POA: Diagnosis not present

## 2018-04-18 DIAGNOSIS — Z5112 Encounter for antineoplastic immunotherapy: Secondary | ICD-10-CM | POA: Diagnosis not present

## 2018-05-09 DIAGNOSIS — R490 Dysphonia: Secondary | ICD-10-CM | POA: Diagnosis not present

## 2018-05-09 DIAGNOSIS — C7802 Secondary malignant neoplasm of left lung: Secondary | ICD-10-CM | POA: Diagnosis not present

## 2018-05-09 DIAGNOSIS — C140 Malignant neoplasm of pharynx, unspecified: Secondary | ICD-10-CM | POA: Diagnosis not present

## 2018-05-09 DIAGNOSIS — C099 Malignant neoplasm of tonsil, unspecified: Secondary | ICD-10-CM | POA: Diagnosis not present

## 2018-05-09 DIAGNOSIS — C770 Secondary and unspecified malignant neoplasm of lymph nodes of head, face and neck: Secondary | ICD-10-CM | POA: Diagnosis not present

## 2018-05-09 DIAGNOSIS — Z923 Personal history of irradiation: Secondary | ICD-10-CM | POA: Diagnosis not present

## 2018-05-09 DIAGNOSIS — Z5112 Encounter for antineoplastic immunotherapy: Secondary | ICD-10-CM | POA: Diagnosis not present

## 2018-05-09 DIAGNOSIS — J3800 Paralysis of vocal cords and larynx, unspecified: Secondary | ICD-10-CM | POA: Diagnosis not present

## 2018-05-09 DIAGNOSIS — Z87891 Personal history of nicotine dependence: Secondary | ICD-10-CM | POA: Diagnosis not present

## 2018-05-09 DIAGNOSIS — Z79899 Other long term (current) drug therapy: Secondary | ICD-10-CM | POA: Diagnosis not present

## 2018-05-09 DIAGNOSIS — C3412 Malignant neoplasm of upper lobe, left bronchus or lung: Secondary | ICD-10-CM | POA: Diagnosis not present

## 2018-06-01 DIAGNOSIS — C3412 Malignant neoplasm of upper lobe, left bronchus or lung: Secondary | ICD-10-CM | POA: Diagnosis not present

## 2018-06-01 DIAGNOSIS — C770 Secondary and unspecified malignant neoplasm of lymph nodes of head, face and neck: Secondary | ICD-10-CM | POA: Diagnosis not present

## 2018-06-01 DIAGNOSIS — E039 Hypothyroidism, unspecified: Secondary | ICD-10-CM | POA: Diagnosis not present

## 2018-06-01 DIAGNOSIS — Z923 Personal history of irradiation: Secondary | ICD-10-CM | POA: Diagnosis not present

## 2018-06-01 DIAGNOSIS — C099 Malignant neoplasm of tonsil, unspecified: Secondary | ICD-10-CM | POA: Diagnosis not present

## 2018-06-01 DIAGNOSIS — C7802 Secondary malignant neoplasm of left lung: Secondary | ICD-10-CM | POA: Diagnosis not present

## 2018-06-01 DIAGNOSIS — Z5112 Encounter for antineoplastic immunotherapy: Secondary | ICD-10-CM | POA: Diagnosis not present

## 2018-06-01 DIAGNOSIS — Z87891 Personal history of nicotine dependence: Secondary | ICD-10-CM | POA: Diagnosis not present

## 2018-06-01 DIAGNOSIS — Z79899 Other long term (current) drug therapy: Secondary | ICD-10-CM | POA: Diagnosis not present

## 2020-01-07 ENCOUNTER — Encounter (HOSPITAL_BASED_OUTPATIENT_CLINIC_OR_DEPARTMENT_OTHER): Payer: Medicare PPO | Attending: Internal Medicine | Admitting: Internal Medicine

## 2020-01-07 DIAGNOSIS — M272 Inflammatory conditions of jaws: Secondary | ICD-10-CM | POA: Insufficient documentation

## 2020-01-07 DIAGNOSIS — C7800 Secondary malignant neoplasm of unspecified lung: Secondary | ICD-10-CM | POA: Insufficient documentation

## 2020-01-07 DIAGNOSIS — C099 Malignant neoplasm of tonsil, unspecified: Secondary | ICD-10-CM | POA: Insufficient documentation

## 2020-01-07 DIAGNOSIS — Z841 Family history of disorders of kidney and ureter: Secondary | ICD-10-CM | POA: Insufficient documentation

## 2020-01-07 DIAGNOSIS — Z809 Family history of malignant neoplasm, unspecified: Secondary | ICD-10-CM | POA: Insufficient documentation

## 2020-01-07 DIAGNOSIS — Z8249 Family history of ischemic heart disease and other diseases of the circulatory system: Secondary | ICD-10-CM | POA: Insufficient documentation

## 2020-01-07 DIAGNOSIS — Z87891 Personal history of nicotine dependence: Secondary | ICD-10-CM | POA: Insufficient documentation

## 2020-01-07 DIAGNOSIS — Z96652 Presence of left artificial knee joint: Secondary | ICD-10-CM | POA: Insufficient documentation

## 2020-01-07 DIAGNOSIS — Z833 Family history of diabetes mellitus: Secondary | ICD-10-CM | POA: Insufficient documentation

## 2020-01-07 DIAGNOSIS — Z9221 Personal history of antineoplastic chemotherapy: Secondary | ICD-10-CM | POA: Insufficient documentation

## 2020-01-07 NOTE — Progress Notes (Signed)
Bradley Oliver ([id number]) Visit Report for 01/07/2020 Abuse/Suicide Risk Screen Details Patient Name: Date of Service: Bradley Oliver, Bradley Bradley M. 01/07/2020 1:15 PM Medical Record Number: [id number] Patient Account Number: [id number] Date of Birth/Sex: Treating RN: July 01, 1955 (64 y.o. Bradley Oliver Primary Care Shazia Mitchener: PA [place], [place] Other Clinician: Referring Kaysee Hergert: Treating Randall Colden/Extender: Cheree Ditto in Treatment: 0 Abuse/Suicide Risk Screen Items Answer ABUSE RISK SCREEN: Has anyone close to you tried to hurt or harm you recentlyo No Do you feel uncomfortable with anyone in your familyo No Has anyone forced you do things that you didnt want to doo No Electronic Signature(s) Signed: 01/07/2020 5:25:51 PM By: Baruch Gouty RN, BSN Entered By: Baruch Gouty on 01/07/2020 14:04:45 -------------------------------------------------------------------------------- Activities of Daily Living Details Patient Name: Date of Service: Gas, [name] 01/07/2020 1:15 PM Medical Record Number: [id number] Patient Account Number: [id number] Date of Birth/Sex: Treating RN: 06-09-1955 (64 y.o. Bradley Oliver Primary Care Sahirah Rudell: PA [place], [place] Other Clinician: Referring Jahna Liebert: Treating Shannan Slinker/Extender: Cheree Ditto in Treatment: 0 Activities of Daily Living Items Answer Activities of Daily Living (Please select one for each item) Drive Automobile Completely Able T Medications ake Completely Able Use T elephone Completely Able Care for Appearance Completely Able Use T oilet Completely Able Bath / Shower Completely Able Dress Self Completely Able Feed Self Completely Able Walk Completely Able Get In / Out Bed Completely Able Housework Completely Able Prepare Meals Completely [place] Completely Able Shop for Self Completely Able Electronic Signature(s) Signed: 01/07/2020 5:25:51 PM By: Baruch Gouty RN, BSN Entered By: Baruch Gouty  on 01/07/2020 14:05:07 -------------------------------------------------------------------------------- Education Screening Details Patient Name: Date of Service: Bradley RPE, Bradley Bradley M. 01/07/2020 1:15 PM Medical Record Number: [id number] Patient Account Number: [id number] Date of Birth/Sex: Treating RN: July 05, 1955 (64 y.o. Bradley Oliver Primary Care Alexiana Laverdure: PA [place], [place] Other Clinician: Referring Shivaay Stormont: Treating Vong Garringer/Extender: Cheree Ditto in Treatment: 0 Primary Learner Assessed: Patient Learning Preferences/Education Level/Primary Language Learning Preference: Explanation, Demonstration, Printed Material Highest Education Level: College or Above Preferred Language: English Cognitive Barrier Language Barrier: No Translator Needed: No Memory Deficit: No Emotional Barrier: No Cultural/Religious Beliefs Affecting Medical Care: No Physical Barrier Impaired Vision: No Impaired Hearing: No Decreased Hand dexterity: No Knowledge/Comprehension Knowledge Level: High Comprehension Level: High Ability to understand written instructions: High Ability to understand verbal instructions: High Motivation Anxiety Level: Calm Cooperation: Cooperative Education Importance: Acknowledges Need Interest in Health Problems: Asks Questions Perception: Coherent Willingness to Engage in Self-Management High Activities: Readiness to Engage in Self-Management High Activities: Electronic Signature(s) Signed: 01/07/2020 5:25:51 PM By: Baruch Gouty RN, BSN Entered By: Baruch Gouty on 01/07/2020 14:05:46 -------------------------------------------------------------------------------- Fall Risk Assessment Details Patient Name: Date of Service: Bradley RPE, Bradley Bradley M. 01/07/2020 1:15 PM Medical Record Number: [id number] Patient Account Number: [id number] Date of Birth/Sex: Treating RN: April 26, 1956 (64 y.o. Bradley Oliver Primary Care Iram Lundberg: PA [place], [place] Other  Clinician: Referring Dnya Hickle: Treating Kieron Kantner/Extender: Cheree Ditto in Treatment: 0 Fall Risk Assessment Items Have you had 2 or more falls in the last 12 monthso 0 No Have you had any fall that resulted in injury in the last 12 monthso 0 No FALLS RISK SCREEN History of falling - immediate or within 3 months 0 No Secondary diagnosis (Do you have 2 or more medical diagnoseso) 0 No Ambulatory aid None/bed rest/wheelchair/nurse 0 Yes Crutches/cane/walker 0 No Furniture 0 No Intravenous therapy Access/Saline/Heparin Lock 0 No Gait/Transferring Normal/ bed rest/ wheelchair 0 Yes Weak (short steps  with or without shuffle, stooped but able to lift head while walking, may seek 0 No support from furniture) Impaired (short steps with shuffle, may have difficulty arising from chair, head down, impaired 0 No balance) Mental Status Oriented to own ability 0 Yes Electronic Signature(s) Signed: 01/07/2020 5:25:51 PM By: Baruch Gouty RN, BSN Entered By: Baruch Gouty on 01/07/2020 14:06:07 -------------------------------------------------------------------------------- Foot Assessment Details Patient Name: Date of Service: Bradley RPE, Bradley Bradley M. 01/07/2020 1:15 PM Medical Record Number: [id number] Patient Account Number: [id number] Date of Birth/Sex: Treating RN: 23-Nov-1955 (64 y.o. Bradley Oliver Primary Care Nicholas Trompeter: PA [place], [place] Other Clinician: Referring Jovon Streetman: Treating Lakenzie Mcclafferty/Extender: Cheree Ditto in Treatment: 0 Foot Assessment Items Site Locations + = Sensation present, - = Sensation absent, C = Callus, U = Ulcer R = Redness, W = Warmth, M = Maceration, PU = Pre-ulcerative lesion F = Fissure, S = Swelling, D = Dryness Assessment Right: Left: Other Deformity: No No Prior Foot Ulcer: No No Prior Amputation: No No Charcot Joint: No No Ambulatory Status: Ambulatory Without Help Gait: Steady Electronic Signature(s) Signed: 01/07/2020 5:25:51 PM  By: Baruch Gouty RN, BSN Entered By: Baruch Gouty on 01/07/2020 14:07:15 -------------------------------------------------------------------------------- Nutrition Risk Screening Details Patient Name: Date of Service: Bradley RPE, Bradley Bradley M. 01/07/2020 1:15 PM Medical Record Number: [id number] Patient Account Number: [id number] Date of Birth/Sex: Treating RN: Nov 27, 1955 (64 y.o. Bradley Oliver Primary Care Randolf Sansoucie: PA [place], [place] Other Clinician: Referring Terrian Sentell: Treating Siah Steely/Extender: Cheree Ditto in Treatment: 0 Height (in): 70 Weight (lbs): 125 Body Mass Index (BMI): 17.9 Nutrition Risk Screening Items Score Screening NUTRITION RISK SCREEN: I have an illness or condition that made me change the kind and/or amount of food I eat 0 No I eat fewer than two meals per day 3 Yes I eat few fruits and vegetables, or milk products 0 No I have three or more drinks of beer, liquor or wine almost every day 0 No I have tooth or mouth problems that make it hard for me to eat 2 Yes I don't always have enough money to buy the food I need 0 No I eat alone most of the time 0 No I take three or more different prescribed or over-the-counter drugs a day 0 No Without wanting to, I have lost or gained 10 pounds in the last six months 2 Yes I am not always physically able to shop, cook and/or feed myself 0 No Nutrition Protocols Good Risk Protocol Moderate Risk Protocol High Risk Proctocol 0 Provide education on nutrition Risk Level: High Risk Score: 7 Electronic Signature(s) Signed: 01/07/2020 5:25:51 PM By: Baruch Gouty RN, BSN Entered By: Baruch Gouty on 01/07/2020 14:07:04

## 2020-01-18 NOTE — Progress Notes (Signed)
Bradley Oliver, Bradley Oliver ([id number]) Visit Report for 01/07/2020 Allergy List Details Patient Name: Date of Service: [place], [place] TTHEW M. 01/07/2020 1:15 PM Medical Record Number: [id number] Patient Account Number: [id number] Date of Birth/Sex: Treating RN: 1955-11-19 (64 y.o. Bradley Oliver Primary Care Perlie Scheuring: PA Haig [place], [place] Other Clinician: Referring Pernell Lenoir: Treating Lothar Prehn/Extender: Cheree Ditto in Treatment: 0 Allergies Active Allergies No Known Allergies Allergy Notes Electronic Signature(s) Signed: 01/07/2020 5:25:51 PM By: Baruch Gouty RN, BSN Entered By: Baruch Gouty on 01/07/2020 13:49:51 -------------------------------------------------------------------------------- Arrival Information Details Patient Name: Date of Service: [facility], MA TTHEW M. 01/07/2020 1:15 PM Medical Record Number: [id number] Patient Account Number: [id number] Date of Birth/Sex: Treating RN: 05/10/1956 (64 y.o. Bradley Oliver Primary Care [name]: PA Haig [place], [place] Other Clinician: Referring [name]: Treating [name]/Extender: Cheree Ditto in Treatment: 0 Visit Information Patient Arrived: Ambulatory Arrival Time: 13:35 Accompanied By: self Transfer Assistance: None Patient Identification Verified: Yes Secondary Verification Process Completed: Yes Patient Requires Transmission-Based Precautions: No Patient Has Alerts: No Electronic Signature(s) Signed: 01/07/2020 5:25:51 PM By: Baruch Gouty RN, BSN Entered By: Baruch Gouty on 01/07/2020 13:43:23 -------------------------------------------------------------------------------- Clinic Level of Care Assessment Details Patient Name: Date of Service: [facility] [place], [place] TTHEW M. 01/07/2020 1:15 PM Medical Record Number: [id number] Patient Account Number: [id number] Date of Birth/Sex: Treating RN: 03/20/1956 (64 y.o. Bradley Oliver) [name] Primary Care [name]: PA Haig Prophet, [place] Other Clinician: Referring [name]: Treating  [name]/Extender: Cheree Ditto in Treatment: 0 Clinic Level of Care Assessment Items TOOL 2 Quantity Score X- 1 0 Use when only an EandM is performed on the INITIAL visit ASSESSMENTS - Nursing Assessment / Reassessment X- 1 20 General Physical Exam (combine w/ comprehensive assessment (listed just below) when performed on new pt. evals) X- 1 25 Comprehensive Assessment (HX, ROS, Risk Assessments, Wounds Hx, etc.) ASSESSMENTS - Wound and Skin A ssessment / Reassessment []  - 0 Simple Wound Assessment / Reassessment - one wound []  - 0 Complex Wound Assessment / Reassessment - multiple wounds []  - 0 Dermatologic / Skin Assessment (not related to wound area) ASSESSMENTS - Ostomy and/or Continence Assessment and Care []  - 0 Incontinence Assessment and Management []  - 0 Ostomy Care Assessment and Management (repouching, etc.) PROCESS - Coordination of Care X - Simple Patient / Family Education for ongoing care 1 15 []  - 0 Complex (extensive) Patient / Family Education for ongoing care []  - 0 Staff obtains Programmer, systems, Records, T Results / Process Orders est []  - 0 Staff telephones HHA, Nursing Homes / Clarify orders / etc []  - 0 Routine Transfer to another Facility (non-emergent condition) []  - 0 Routine Hospital Admission (non-emergent condition) X- 1 15 New Admissions / Biomedical engineer / Ordering NPWT Apligraf, etc. , []  - 0 Emergency Hospital Admission (emergent condition) X- 1 10 Simple Discharge Coordination []  - 0 Complex (extensive) Discharge Coordination PROCESS - Special Needs []  - 0 Pediatric / Minor Patient Management []  - 0 Isolation Patient Management []  - 0 Hearing / Language / Visual special needs []  - 0 Assessment of Community assistance (transportation, D/C planning, etc.) []  - 0 Additional assistance / Altered mentation []  - 0 Support Surface(s) Assessment (bed, cushion, seat, etc.) INTERVENTIONS - Wound Cleansing / Measurement []   - 0 Wound Imaging (photographs - any number of wounds) []  - 0 Wound Tracing (instead of photographs) []  - 0 Simple Wound Measurement - one wound []  - 0 Complex Wound Measurement - multiple wounds []  - 0 Simple Wound Cleansing - one wound []  - 0 Complex  Wound Cleansing - multiple wounds INTERVENTIONS - Wound Dressings []  - 0 Small Wound Dressing one or multiple wounds []  - 0 Medium Wound Dressing one or multiple wounds []  - 0 Large Wound Dressing one or multiple wounds []  - 0 Application of Medications - injection INTERVENTIONS - Miscellaneous []  - 0 External ear exam []  - 0 Specimen Collection (cultures, biopsies, blood, body fluids, etc.) []  - 0 Specimen(s) / Culture(s) sent or taken to Lab for analysis []  - 0 Patient Transfer (multiple staff / Harrel Lemon Lift / Similar devices) []  - 0 Simple Staple / Suture removal (25 or less) []  - 0 Complex Staple / Suture removal (26 or more) []  - 0 Hypo / Hyperglycemic Management (close monitor of Blood Glucose) []  - 0 Ankle / Brachial Index (ABI) - do not check if billed separately Has the patient been seen at the hospital within the last three years: Yes Total Score: 85 Level Of Care: New/Established - Level 3 Electronic Signature(s) Signed: 01/17/2020 5:50:16 PM By: [name] RN Entered By: [name] on 01/07/2020 14:58:41 -------------------------------------------------------------------------------- Encounter Discharge Information Details Patient Name: Date of Service: SHA RPE, MA TTHEW M. 01/07/2020 1:15 PM Medical Record Number: [id number] Patient Account Number: [id number] Date of Birth/Sex: Treating RN: 08/29/1955 (64 y.o. Bradley Oliver) [name] Primary Care Ramey Schiff: PA Haig Prophet, [place] Other Clinician: Referring Harman Langhans: Treating Nijel Flink/Extender: Cheree Ditto in Treatment: 0 Encounter Discharge Information Items Discharge Condition: Stable Ambulatory Status: Ambulatory Discharge Destination: Home Transportation:  Private Auto Accompanied By: self Schedule Follow-up Appointment: Yes Clinical Summary of Care: Patient Declined Electronic Signature(s) Signed: 01/17/2020 5:50:16 PM By: [name] RN Entered By: [name] on 01/07/2020 15:03:49 -------------------------------------------------------------------------------- Lower Extremity Assessment Details Patient Name: Date of Service: [place], [place] TTHEW M. 01/07/2020 1:15 PM Medical Record Number: [id number] Patient Account Number: [id number] Date of Birth/Sex: Treating RN: [birth year] (64 y.o. Bradley Oliver Primary Care Mckoy Bhakta: PA Haig [place], [place] Other Clinician: Referring Chery Giusto: Treating Izael Bessinger/Extender: Cheree Ditto in Treatment: 0 Electronic Signature(s) Signed: 01/07/2020 5:25:51 PM By: Baruch Gouty RN, BSN Signed: 01/07/2020 5:25:51 PM By: Baruch Gouty RN, BSN Entered By: Baruch Gouty on 01/07/2020 14:07:27 -------------------------------------------------------------------------------- [place] Details Patient Name: Date of Service: [facility] RPE, [place] TTHEW M. 01/07/2020 1:15 PM Medical Record Number: [id number] Patient Account Number: [id number] Date of Birth/Sex: Treating RN: Oct 08, 1955 (64 y.o. Oval Linsey Primary Care Sedalia Greeson: PA Haig Prophet, [place] Other Clinician: Referring Ramari Bray: Treating Jia Mohamed/Extender: Cheree Ditto in Treatment: 0 Active Inactive Electronic Signature(s) Signed: 01/17/2020 5:50:16 PM By: [name] RN Entered By: [name] on 01/07/2020 14:51:42 -------------------------------------------------------------------------------- Pain Assessment Details Patient Name: Date of Service: [place], [place]. 01/07/2020 1:15 PM Medical Record Number: [id number] Patient Account Number: [id number] Date of Birth/Sex: Treating RN: 01-15-1956 (64 y.o. Bradley Oliver Primary Care Alysson Geist: PA Haig [place], [place] Other Clinician: Referring Miyana Mordecai: Treating  Vandella Ord/Extender: Cheree Ditto in Treatment: 0 Active Problems Location of Pain Severity and Description of Pain Patient Has Paino No Site Locations Rate the pain. Current Pain Level: 0 Pain Management and Medication Current Pain Management: Electronic Signature(s) Signed: 01/07/2020 5:25:51 PM By: Baruch Gouty RN, BSN Entered By: Baruch Gouty on 01/07/2020 14:07:48 -------------------------------------------------------------------------------- Patient/Caregiver Education Details Patient Name: Date of Service: Pilar Grammes, [place] Lita Mains 8/17/2021andnbsp1:15 PM Medical Record Number: [id number] Patient Account Number: [id number] Date of Birth/Gender: Treating RN: Mar 19, 1956 (64 y.o. Bradley Oliver) [name] Primary Care Physician: PA Haig [place], [place] Other Clinician: Referring Physician: Treating Physician/Extender: Cheree Ditto in Treatment: 0 Education Assessment Education  Provided To: Patient Education Topics Provided Infection: Methods: Explain/Verbal Responses: State content correctly Electronic Signature(s) Signed: 01/17/2020 5:50:16 PM By: [name] RN Entered By: [name] on 01/07/2020 14:52:24 -------------------------------------------------------------------------------- Vitals Details Patient Name: Date of Service: SHA RPE, [place] TTHEW M. 01/07/2020 1:15 PM Medical Record Number: [id number] Patient Account Number: [id number] Date of Birth/Sex: Treating RN: 1955-06-21 (64 y.o. Bradley Oliver Primary Care Khadejah Son: PA Haig [place], [place] Other Clinician: Referring Shawneequa Baldridge: Treating Grey Schlauch/Extender: Cheree Ditto in Treatment: 0 Vital Signs Time Taken: 13:47 Temperature (F): 98.3 Height (in): 70 Pulse (bpm): 77 Source: Stated Respiratory Rate (breaths/min): 18 Weight (lbs): 125 Blood Pressure (mmHg): 109/73 Source: Stated Reference Range: 80 - 120 mg / dl Body Mass Index (BMI): 17.9 Electronic Signature(s) Signed: 01/07/2020 5:25:51 PM  By: Baruch Gouty RN, BSN Entered By: Baruch Gouty on 01/07/2020 13:48:33

## 2020-01-18 NOTE — Progress Notes (Signed)
Bradley Oliver ([id number]) Visit Report for 01/07/2020 Chief Complaint Document Details Patient Name: Date of Service: Bradley Oliver, [place] TTHEW M. 01/07/2020 1:15 PM Medical Record Number: [id number] Patient Account Number: [id number] Date of Birth/Sex: Treating RN: January 10, 1956 (64 y.o. Bradley Oliver) [name] Primary Care Provider: PA Haig Prophet, [place] Other Clinician: Referring Provider: Treating Provider/Extender: Cheree Ditto in Treatment: 0 Information Obtained from: Patient Chief Complaint 01/07/2020; patient is here for review of consideration of hyperbaric oxygen Electronic Signature(s) Signed: 01/07/2020 6:57:29 PM By: Linton Ham MD Entered By: Linton Ham on 01/07/2020 17:20:16 -------------------------------------------------------------------------------- HPI Details Patient Name: Date of Service: Bradley RPE, Bradley TTHEW M. 01/07/2020 1:15 PM Medical Record Number: [id number] Patient Account Number: [id number] Date of Birth/Sex: Treating RN: [birth year] (64 y.o. 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 He was treated with a combination of chemoradiation with cis-platinum and 35 fractions of radiation with 70 Gy at [facility]. He did relatively well until 2019 when he just discovered to have a left upper lobe cavitary lung mass after being ill for several months with cough, hoarseness and weight loss. 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 He was treated with chemo immunotherapy. I am not sure looking through care everywhere the extent of his disease currently although it certainly not cured. Patient spends a lot of  time looking for different research trials that he might be able to participate in including in [place]. He is essentially referred here because he developed pain in his teeth in July. He was seen by his oral surgeon Dr. Lewanda Rife. His radiation oncologist Dr. Shawna Orleans had suggested the possibility of doing hyperbarics prior to any dental extractions. Looking through Dr. Caesar Bookman notes he had decay of #17. A sore spot was noted on the right palate but without swelling or drainage. #32 was also decayed. From the patient's point of view he thinks he needs bilateral lower jaw wisdom teeth extractions. He does not have a history of soft tissue radiation injury in his mouth or osteoradionecrosis. A Panorex that was included with Dr. Caesar Bookman notes looked fairly unremarkable at least to my eye. Electronic Signature(s) Signed: 01/07/2020 6:57:29 PM By: Linton Ham MD Entered By: Linton Ham on 01/07/2020 17:24:45 -------------------------------------------------------------------------------- Physical Exam Details Patient Name: Date of Service: Bradley RPE, Bradley TTHEW M. 01/07/2020 1:15 PM Medical Record Number: [id number] Patient Account Number: [id number] Date of Birth/Sex: Treating RN: December 30, 1955 (64 y.o. Bradley Oliver) [name] Primary Care Provider: PA Haig Prophet, NO Other Clinician: Referring Provider: Treating Provider/Extender: Cheree Ditto in Treatment: 0 Constitutional Sitting or standing Blood Pressure is within target range for patient.. Pulse regular and within target range for patient.Marland Kitchen Respirations regular, non-labored and within target range.. Temperature is normal and within the target range for the patient.Marland Kitchen Appears in no distress. Integumentary (Hair, Skin) Radiation-induced skin changes in the lower neck on the left. Psychiatric appears at normal baseline. Electronic Signature(s) Signed: 01/07/2020 6:57:29 PM By: Linton Ham MD Entered By: Linton Ham on 01/07/2020  17:25:19 -------------------------------------------------------------------------------- Physician Orders Details Patient Name: Date of Service: Bradley RPE, Bradley TTHEW M. 01/07/2020 1:15 PM Medical Record Number: [id number] Patient Account Number: [id number] Date of Birth/Sex: Treating RN: [birth year] (64 y.o. Bradley Oliver) [name] Primary Care Provider: PA Haig Prophet, NO Other Clinician: Referring  Provider: Treating Provider/Extender: Cheree Ditto in Treatment: 0 Verbal / Phone Orders: No Diagnosis Coding Discharge From [facility] Services Discharge from [place] - consult only Electronic Signature(s) Signed: 01/07/2020 6:57:29 PM By: Linton Ham MD Signed: 01/17/2020 5:50:16 PM By: [name] RN Entered By: [name] on 01/07/2020 14:50:16 -------------------------------------------------------------------------------- Problem List Details Patient Name: Date of Service: [facility] RPE, Bradley TTHEW M. 01/07/2020 1:15 PM Medical Record Number: [id number] Patient Account Number: [id number] Date of Birth/Sex: Treating RN: [birth year] (64 y.o. Bradley Oliver) [name] Primary Care Provider: PA Haig Prophet, NO Other Clinician: Referring Provider: Treating Provider/Extender: Cheree Ditto in Treatment: 0 Active Problems ICD-10 Encounter Code Description Active Date MDM Diagnosis Z85.818 Personal history of malignant neoplasm of other sites of lip, oral cavity, and 01/07/2020 No Yes pharynx L57.8 Other skin changes due to chronic exposure to nonionizing radiation 01/07/2020 No Yes Inactive Problems Resolved Problems Electronic Signature(s) Signed: 01/07/2020 6:57:29 PM By: Linton Ham MD Entered By: Linton Ham on 01/07/2020 17:14:21 -------------------------------------------------------------------------------- Progress Note Details Patient Name: Date of Service: Bradley RPE, Bradley TTHEW M. 01/07/2020 1:15 PM Medical Record Number: [id number] Patient Account Number: [id number] Date of Birth/Sex:  Treating RN: 05-Mar-1956 (64 y.o. 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 He was treated with a combination of chemoradiation with cis-platinum and 35 fractions of radiation with 70 Gy at [facility]. He did relatively well until 2019 when he just discovered to have a left upper lobe cavitary lung mass after being ill for several months with cough, hoarseness and weight loss. 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 He was treated with chemo immunotherapy. I am not sure looking through care everywhere the extent of his disease currently although it certainly not cured. Patient spends a lot of time looking for different research trials that he might be able to participate in including in [place]. He is essentially referred here because he developed pain in his teeth in July. He was seen by his oral surgeon Dr. Lewanda Rife. His radiation oncologist Dr. Shawna Orleans had suggested the possibility of doing hyperbarics prior to any dental extractions. Looking through Dr. Caesar Bookman notes he had decay of #17. A sore spot was noted on the right palate but without swelling or drainage. #32 was also decayed. From the patient's point of view he thinks he needs bilateral lower jaw wisdom teeth extractions. He does not have a history of soft tissue radiation injury in his mouth or osteoradionecrosis. A Panorex that was included with Dr. Caesar Bookman notes looked fairly unremarkable at least to my eye. Patient History Information  obtained from Patient. Allergies No Known Allergies Family History Cancer - Father,Paternal Grandparents, Diabetes - Father,Paternal Grandparents, Heart Disease - Father, Hypertension - Mother, Kidney Disease - Father, Stroke - Maternal Grandparents, No family history of Hereditary Spherocytosis, Lung Disease, Seizures, Thyroid Problems, Tuberculosis. Social History Former smoker, Marital Status - Single, Alcohol Use - Never, Drug Use - No History, Caffeine Use - Daily - cocoa. 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 - left bunion surgery. 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 Eyes Denies complaints or symptoms of Dry Eyes, Vision Changes, Glasses / Contacts. Ear/Nose/Mouth/Throat Denies complaints or symptoms of Chronic sinus problems or rhinitis. Respiratory Denies complaints or symptoms of Chronic or frequent coughs, Shortness of Breath. Cardiovascular Denies complaints or symptoms of Chest pain. Gastrointestinal Denies complaints or symptoms of Frequent diarrhea, Nausea, Vomiting. Endocrine Denies complaints or symptoms of Heat/cold intolerance. Genitourinary Denies complaints or symptoms of Frequent urination. Integumentary (Skin) Denies complaints or symptoms of Wounds. Musculoskeletal Denies complaints or symptoms of Muscle Pain, Muscle  Weakness. Neurologic Complains or has symptoms of Numbness/parasthesias - bottom of feet. Psychiatric Denies complaints or symptoms of Claustrophobia, Suicidal. Objective Constitutional Sitting or standing Blood Pressure is within target range for patient.. Pulse regular and within target range for patient.Marland Kitchen Respirations regular, non-labored and within target range.. Temperature is normal and within the target range for the patient.Marland Kitchen Appears in no distress. Vitals Time Taken: 1:47 PM, Height: 70 in, Source: Stated, Weight: 125 lbs, Source: Stated, BMI: 17.9, Temperature: 98.3 F, Pulse: 77 bpm, Respiratory Rate: 18 breaths/min, Blood Pressure: 109/73 mmHg. Psychiatric appears at normal baseline. Integumentary (Hair, Skin) Radiation-induced skin changes in the lower neck on the left. 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 Services: Discharge from [place] - consult only 1. The patient will not qualify for hyperbaric oxygen through Medicare as he does not have Medicare qualifying diagnosis which would either be osteoradionecrosis of the mandible or possibly soft tissue radionecrosis.. We would not be able to put him through hyperbaric treatment prophylactically prior to dental extractions without qualifying diagnoses. 2. I told the patient we would reconsider this if I am overlooking some part of this. 3. He did have a Panorex x-ray which was a copy however I did not see anything obviously abnormal and that was the patient's interpretation of what Dr. Lewanda Rife said as well Electronic Signature(s) Signed: 01/07/2020 6:57:29 PM By: Linton Ham MD Entered By: Linton Ham on 01/07/2020 17:28:27 -------------------------------------------------------------------------------- HxROS Details Patient  Name: Date of Service: Bradley RPE, Bradley TTHEW M. 01/07/2020 1:15 PM Medical Record Number: [id number] Patient Account Number: [id number] Date of Birth/Sex: Treating RN: 11-16-1955 (64 y.o. Bradley Oliver Primary Care Provider: PA Haig Prophet, [place] Other Clinician: Referring Provider: Treating Provider/Extender: Cheree Ditto in Treatment: 0 Information Obtained From Patient Constitutional Symptoms (General Health) Complaints and Symptoms: Negative for: Fatigue; Fever; Chills; Marked Weight Change Eyes Complaints and Symptoms: Negative for: Dry Eyes; Vision Changes; Glasses / Contacts Ear/Nose/Mouth/Throat Complaints and Symptoms: Negative for: Chronic sinus problems or rhinitis Respiratory Complaints and Symptoms: Negative for: Chronic or frequent coughs; Shortness of Breath Medical History: Positive for: Pneumothorax - left Past Medical History Notes: lund CA Cardiovascular Complaints and Symptoms: Negative for: Chest pain Gastrointestinal Complaints and Symptoms: Negative for: Frequent diarrhea; Nausea; Vomiting Endocrine Complaints and Symptoms: Negative for: Heat/cold intolerance Medical History: Negative for: Type I Diabetes; Type II Diabetes Past Medical History Notes: 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 Genitourinary Complaints and Symptoms: Negative for: Frequent urination  Integumentary (Skin) Complaints and Symptoms: Negative for: Wounds Musculoskeletal Complaints and Symptoms: Negative for: Muscle Pain; Muscle Weakness Neurologic Complaints and Symptoms: Positive for: Numbness/parasthesias - bottom of feet Medical History: Positive for: 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 Psychiatric Complaints and Symptoms: Negative for: Claustrophobia; Suicidal Medical History: Negative for: Anorexia/bulimia; Confinement Anxiety Hematologic/Lymphatic Immunological Oncologic Medical History: Positive for: Received Chemotherapy; Received Radiation - 2016 Past  Medical History Notes: tonsilar CA, metastatic lung CA Immunizations Pneumococcal Vaccine: Received Pneumococcal Vaccination: Yes Implantable Devices None Hospitalization / Surgery History Type of Hospitalization/Surgery left knee replacement left bunion surgery Family and Social History Cancer: Yes - Father,Paternal Grandparents; Diabetes: Yes - Father,Paternal Grandparents; Heart Disease: Yes - Father; Hereditary Spherocytosis: No; Hypertension: Yes - Mother; Kidney Disease: Yes - Father; Lung Disease: No; Seizures: No; Stroke: Yes - Maternal Grandparents; Thyroid Problems: No; Tuberculosis: No; Former smoker; Marital Status - Single; Alcohol Use: Never; Drug Use: No History; Caffeine Use: Daily - cocoa; Financial Concerns: No; Food, Clothing or Shelter Needs: No; Support System Lacking: No; Transportation Concerns: No Engineer, maintenance) Signed: 01/07/2020 5:25:51 PM By: Baruch Gouty RN, BSN Signed: 01/07/2020 6:57:29 PM By: Linton Ham MD Entered By: Baruch Gouty on 01/07/2020 14:02:49 -------------------------------------------------------------------------------- [place] Details Patient Name: Date of Service: [facility] RPE, Bradley TTHEW M. 01/07/2020 Medical Record Number: [id number] Patient Account Number: [id number] Date of Birth/Sex: Treating RN: 03/01/1956 (64 y.o. Bradley Oliver) [name] Primary Care Provider: PA Haig Prophet, NO Other Clinician: Referring Provider: Treating Provider/Extender: Cheree Ditto in Treatment: 0 Diagnosis Coding ICD-10 Codes Code Description [phone] Personal history of malignant neoplasm of other sites of lip, oral cavity, and pharynx L57.8 Other skin changes due to chronic exposure to nonionizing radiation Facility Procedures CPT4 Code: [id number] Description: 99213 - WOUND CARE VISIT-LEV 3 EST PT Modifier: Quantity: 1 Physician Procedures : CPT4 Code Description Modifier [id number] - WC PHYS LEVEL 2 - NEW PT ICD-10 Diagnosis Description  F81.017 Personal history of malignant neoplasm of other sites of lip, oral cavity, and pharynx L57.8 Other skin changes due to chronic exposure to  nonionizing radiation Quantity: 1 Electronic Signature(s) Signed: 01/07/2020 6:57:29 PM By: Linton Ham MD Entered By: Linton Ham on 01/07/2020 17:28:56

## 2020-02-03 ENCOUNTER — Encounter (HOSPITAL_BASED_OUTPATIENT_CLINIC_OR_DEPARTMENT_OTHER): Payer: BLUE CROSS/BLUE SHIELD | Admitting: Internal Medicine

## 2020-09-11 ENCOUNTER — Other Ambulatory Visit: Payer: Self-pay | Admitting: Internal Medicine

## 2020-09-12 LAB — COMPLETE METABOLIC PANEL WITH GFR
AG Ratio: 2.2 (calc) (ref 1.0–2.5)
ALT: 6 U/L — ABNORMAL LOW (ref 9–46)
AST: 17 U/L (ref 10–35)
Albumin: 4.2 g/dL (ref 3.6–5.1)
Alkaline phosphatase (APISO): 63 U/L (ref 35–144)
BUN: 19 mg/dL (ref 7–25)
CO2: 25 mmol/L (ref 20–32)
Calcium: 9.3 mg/dL (ref 8.6–10.3)
Chloride: 105 mmol/L (ref 98–110)
Creat: 0.83 mg/dL (ref 0.70–1.25)
GFR, Est African American: 109 mL/min/{1.73_m2} (ref >=60)
GFR, Est Non African American: 94 mL/min/{1.73_m2} (ref >=60)
Globulin: 1.9 g/dL (calc) (ref 1.9–3.7)
Glucose, Bld: 93 mg/dL (ref 65–99)
Potassium: 4.7 mmol/L (ref 3.5–5.3)
Sodium: 141 mmol/L (ref 135–146)
Total Bilirubin: 0.6 mg/dL (ref 0.2–1.2)
Total Protein: 6.1 g/dL (ref 6.1–8.1)

## 2020-09-12 LAB — CBC
HCT: 41.7 % (ref 38.5–50.0)
Hemoglobin: 14.1 g/dL (ref 13.2–17.1)
MCH: 30.5 pg (ref 27.0–33.0)
MCHC: 33.8 g/dL (ref 32.0–36.0)
MCV: 90.3 fL (ref 80.0–100.0)
MPV: 9.6 fL (ref 7.5–12.5)
Platelets: 218 10*3/uL (ref 140–400)
RBC: 4.62 10*6/uL (ref 4.20–5.80)
RDW: 12.7 % (ref 11.0–15.0)
WBC: 5.4 10*3/uL (ref 3.8–10.8)

## 2020-09-12 LAB — LIPID PANEL
Cholesterol: 164 mg/dL (ref <200)
HDL: 48 mg/dL (ref >=40)
LDL Cholesterol (Calc): 95 mg/dL (calc)
Non-HDL Cholesterol (Calc): 116 mg/dL (calc) (ref <130)
Total CHOL/HDL Ratio: 3.4 (calc) (ref <5.0)
Triglycerides: 118 mg/dL (ref <150)

## 2020-09-12 LAB — CLIENT EDUCATION TRACKING

## 2020-09-12 LAB — TSH: TSH: 1.47 mIU/L (ref 0.40–4.50)

## 2020-09-12 LAB — T4, FREE: Free T4: 1.4 ng/dL (ref 0.8–1.8)

## 2020-09-12 LAB — VITAMIN D 25 HYDROXY (VIT D DEFICIENCY, FRACTURES): Vit D, 25-Hydroxy: 44 ng/mL (ref 30–100)

## 2021-03-23 ENCOUNTER — Telehealth: Payer: Self-pay

## 2021-03-23 NOTE — Telephone Encounter (Signed)
Spoke with patient and scheduled an in-person Palliative Consult for 04/07/21 @ 11:30 AM with Dr. Hollace Kinnier. Documentation will be noted in [place].   COVID screening was negative. Couple cats in the home. Patient lives with mother and sister.  Consent obtained; updated Outlook/Netsmart/Team List and Epic.   Patient is aware he may be receiving a call from provider the day before or day of to confirm appointment.

## 2021-04-01 ENCOUNTER — Inpatient Hospital Stay (HOSPITAL_COMMUNITY)
Admission: EM | Admit: 2021-04-01 | Discharge: 2021-04-09 | DRG: 456 | Disposition: A | Discharge status: Skilled Nursing Facility | Payer: Medicare PPO | Attending: Internal Medicine | Admitting: Internal Medicine

## 2021-04-01 ENCOUNTER — Encounter (HOSPITAL_COMMUNITY): Payer: Self-pay

## 2021-04-01 ENCOUNTER — Emergency Department (HOSPITAL_COMMUNITY): Payer: Medicare PPO

## 2021-04-01 DIAGNOSIS — J90 Pleural effusion, not elsewhere classified: Secondary | ICD-10-CM

## 2021-04-01 DIAGNOSIS — R0600 Dyspnea, unspecified: Secondary | ICD-10-CM

## 2021-04-01 DIAGNOSIS — Z8249 Family history of ischemic heart disease and other diseases of the circulatory system: Secondary | ICD-10-CM

## 2021-04-01 DIAGNOSIS — C7801 Secondary malignant neoplasm of right lung: Secondary | ICD-10-CM | POA: Diagnosis present

## 2021-04-01 DIAGNOSIS — Z66 Do not resuscitate: Secondary | ICD-10-CM | POA: Diagnosis present

## 2021-04-01 DIAGNOSIS — L89151 Pressure ulcer of sacral region, stage 1: Secondary | ICD-10-CM | POA: Diagnosis present

## 2021-04-01 DIAGNOSIS — C7951 Secondary malignant neoplasm of bone: Secondary | ICD-10-CM | POA: Diagnosis present

## 2021-04-01 DIAGNOSIS — Z923 Personal history of irradiation: Secondary | ICD-10-CM

## 2021-04-01 DIAGNOSIS — R1310 Dysphagia, unspecified: Secondary | ICD-10-CM | POA: Diagnosis present

## 2021-04-01 DIAGNOSIS — Z20822 Contact with and (suspected) exposure to covid-19: Secondary | ICD-10-CM | POA: Diagnosis present

## 2021-04-01 DIAGNOSIS — G9529 Other cord compression: Secondary | ICD-10-CM | POA: Diagnosis present

## 2021-04-01 DIAGNOSIS — C7802 Secondary malignant neoplasm of left lung: Secondary | ICD-10-CM | POA: Diagnosis present

## 2021-04-01 DIAGNOSIS — K5900 Constipation, unspecified: Secondary | ICD-10-CM | POA: Diagnosis present

## 2021-04-01 DIAGNOSIS — E876 Hypokalemia: Secondary | ICD-10-CM

## 2021-04-01 DIAGNOSIS — E43 Unspecified severe protein-calorie malnutrition: Secondary | ICD-10-CM | POA: Diagnosis present

## 2021-04-01 DIAGNOSIS — M4804 Spinal stenosis, thoracic region: Secondary | ICD-10-CM | POA: Diagnosis present

## 2021-04-01 DIAGNOSIS — G9741 Accidental puncture or laceration of dura during a procedure: Secondary | ICD-10-CM | POA: Diagnosis not present

## 2021-04-01 DIAGNOSIS — D492 Neoplasm of unspecified behavior of bone, soft tissue, and skin: Secondary | ICD-10-CM | POA: Diagnosis present

## 2021-04-01 DIAGNOSIS — Z419 Encounter for procedure for purposes other than remedying health state, unspecified: Secondary | ICD-10-CM

## 2021-04-01 DIAGNOSIS — E039 Hypothyroidism, unspecified: Secondary | ICD-10-CM | POA: Diagnosis present

## 2021-04-01 DIAGNOSIS — Z9221 Personal history of antineoplastic chemotherapy: Secondary | ICD-10-CM

## 2021-04-01 DIAGNOSIS — Z681 Body mass index (BMI) 19 or less, adult: Secondary | ICD-10-CM

## 2021-04-01 DIAGNOSIS — M8458XA Pathological fracture in neoplastic disease, other specified site, initial encounter for fracture: Principal | ICD-10-CM | POA: Diagnosis present

## 2021-04-01 DIAGNOSIS — Z808 Family history of malignant neoplasm of other organs or systems: Secondary | ICD-10-CM

## 2021-04-01 DIAGNOSIS — Z79890 Hormone replacement therapy (postmenopausal): Secondary | ICD-10-CM

## 2021-04-01 DIAGNOSIS — M546 Pain in thoracic spine: Secondary | ICD-10-CM | POA: Diagnosis not present

## 2021-04-01 DIAGNOSIS — C7949 Secondary malignant neoplasm of other parts of nervous system: Secondary | ICD-10-CM

## 2021-04-01 DIAGNOSIS — Y658 Other specified misadventures during surgical and medical care: Secondary | ICD-10-CM | POA: Diagnosis not present

## 2021-04-01 DIAGNOSIS — R7989 Other specified abnormal findings of blood chemistry: Secondary | ICD-10-CM

## 2021-04-01 DIAGNOSIS — Z87891 Personal history of nicotine dependence: Secondary | ICD-10-CM

## 2021-04-01 DIAGNOSIS — Z803 Family history of malignant neoplasm of breast: Secondary | ICD-10-CM

## 2021-04-01 DIAGNOSIS — L8990 Pressure ulcer of unspecified site, unspecified stage: Secondary | ICD-10-CM | POA: Insufficient documentation

## 2021-04-01 DIAGNOSIS — C099 Malignant neoplasm of tonsil, unspecified: Secondary | ICD-10-CM | POA: Diagnosis present

## 2021-04-01 DIAGNOSIS — Z833 Family history of diabetes mellitus: Secondary | ICD-10-CM

## 2021-04-01 DIAGNOSIS — G9520 Unspecified cord compression: Secondary | ICD-10-CM | POA: Diagnosis present

## 2021-04-01 DIAGNOSIS — D62 Acute posthemorrhagic anemia: Secondary | ICD-10-CM | POA: Diagnosis not present

## 2021-04-01 LAB — CBC WITH DIFFERENTIAL/PLATELET
Abs Immature Granulocytes: 0.04 10*3/uL (ref 0.00–0.07)
Basophils Absolute: 0 10*3/uL (ref 0.0–0.1)
Basophils Relative: 1 %
Eosinophils Absolute: 0 10*3/uL (ref 0.0–0.5)
Eosinophils Relative: 1 %
HCT: 40.9 % (ref 39.0–52.0)
Hemoglobin: 14 g/dL (ref 13.0–17.0)
Immature Granulocytes: 1 %
Lymphocytes Relative: 8 %
Lymphs Abs: 0.5 10*3/uL — ABNORMAL LOW (ref 0.7–4.0)
MCH: 30.2 pg (ref 26.0–34.0)
MCHC: 34.2 g/dL (ref 30.0–36.0)
MCV: 88.1 fL (ref 80.0–100.0)
Monocytes Absolute: 0.6 10*3/uL (ref 0.1–1.0)
Monocytes Relative: 10 %
Neutro Abs: 5.4 10*3/uL (ref 1.7–7.7)
Neutrophils Relative %: 79 %
Platelets: 274 10*3/uL (ref 150–400)
RBC: 4.64 MIL/uL (ref 4.22–5.81)
RDW: 12.6 % (ref 11.5–15.5)
WBC: 6.6 10*3/uL (ref 4.0–10.5)
nRBC: 0 % (ref 0.0–0.2)

## 2021-04-01 LAB — COMPREHENSIVE METABOLIC PANEL
ALT: 11 U/L (ref 0–44)
AST: 27 U/L (ref 15–41)
Albumin: 4.2 g/dL (ref 3.5–5.0)
Alkaline Phosphatase: 82 U/L (ref 38–126)
Anion gap: 9 (ref 5–15)
BUN: 20 mg/dL (ref 8–23)
CO2: 27 mmol/L (ref 22–32)
Calcium: 9.3 mg/dL (ref 8.9–10.3)
Chloride: 101 mmol/L (ref 98–111)
Creatinine, Ser: 0.72 mg/dL (ref 0.61–1.24)
GFR, Estimated: >60 mL/min (ref >60)
Glucose, Bld: 97 mg/dL (ref 70–99)
Potassium: 3.4 mmol/L — ABNORMAL LOW (ref 3.5–5.1)
Sodium: 137 mmol/L (ref 135–145)
Total Bilirubin: 1.2 mg/dL (ref 0.3–1.2)
Total Protein: 7.2 g/dL (ref 6.5–8.1)

## 2021-04-01 LAB — URINALYSIS, ROUTINE W REFLEX MICROSCOPIC
Bilirubin Urine: NEGATIVE
Glucose, UA: NEGATIVE mg/dL
Hgb urine dipstick: NEGATIVE
Ketones, ur: 5 mg/dL — AB
Leukocytes,Ua: NEGATIVE
Nitrite: NEGATIVE
Protein, ur: NEGATIVE mg/dL
Specific Gravity, Urine: 1.012 (ref 1.005–1.030)
pH: 5 (ref 5.0–8.0)

## 2021-04-01 LAB — D-DIMER, QUANTITATIVE: D-Dimer, Quant: 1.69 ug/mL-FEU — ABNORMAL HIGH (ref 0.00–0.50)

## 2021-04-01 LAB — CBG MONITORING, ED: Glucose-Capillary: 104 mg/dL — ABNORMAL HIGH (ref 70–99)

## 2021-04-01 LAB — TROPONIN I (HIGH SENSITIVITY): Troponin I (High Sensitivity): 3 ng/L (ref <18)

## 2021-04-01 IMAGING — DX DG CHEST 1V PORT
2 series · 2 of 2 positions shown · non-contrast
Comparison: [DATE]

CLINICAL DATA: Chest tightness, tumor at T4-6

EXAM:
PORTABLE CHEST 1 VIEW

[chest ap (1 of 2)]
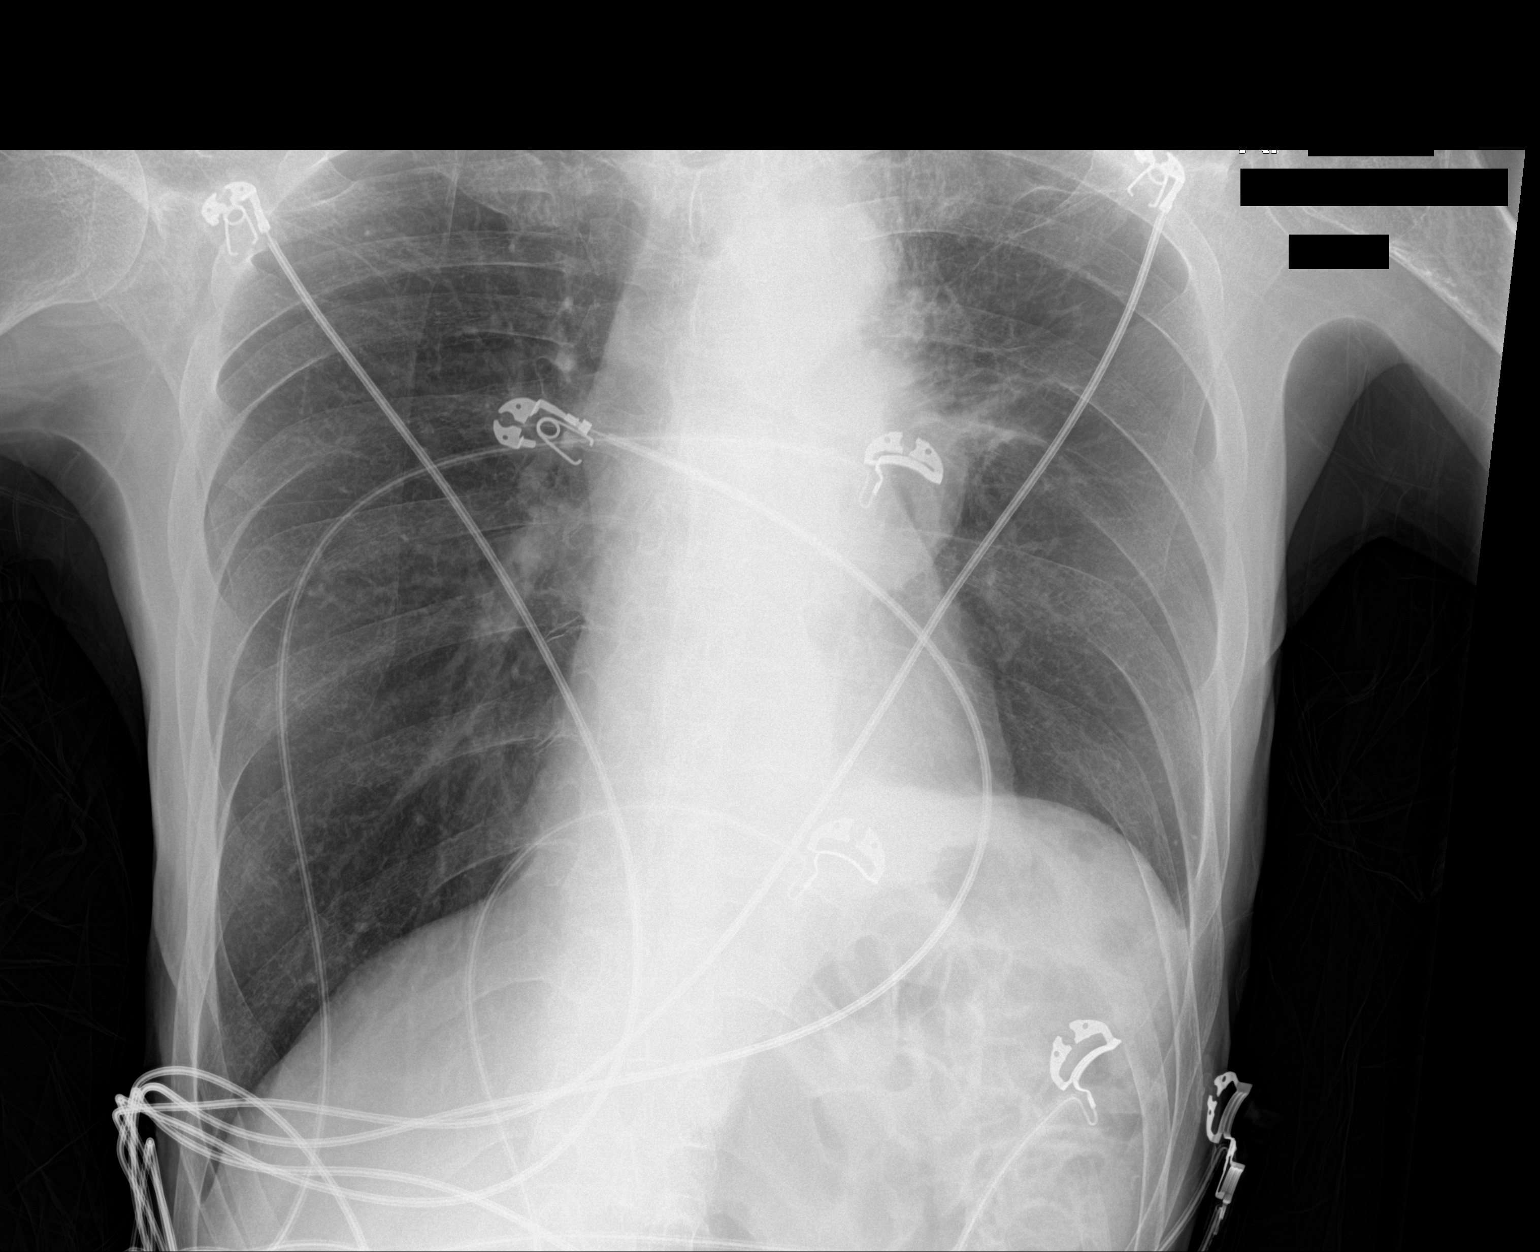

[chest ap (2 of 2)]
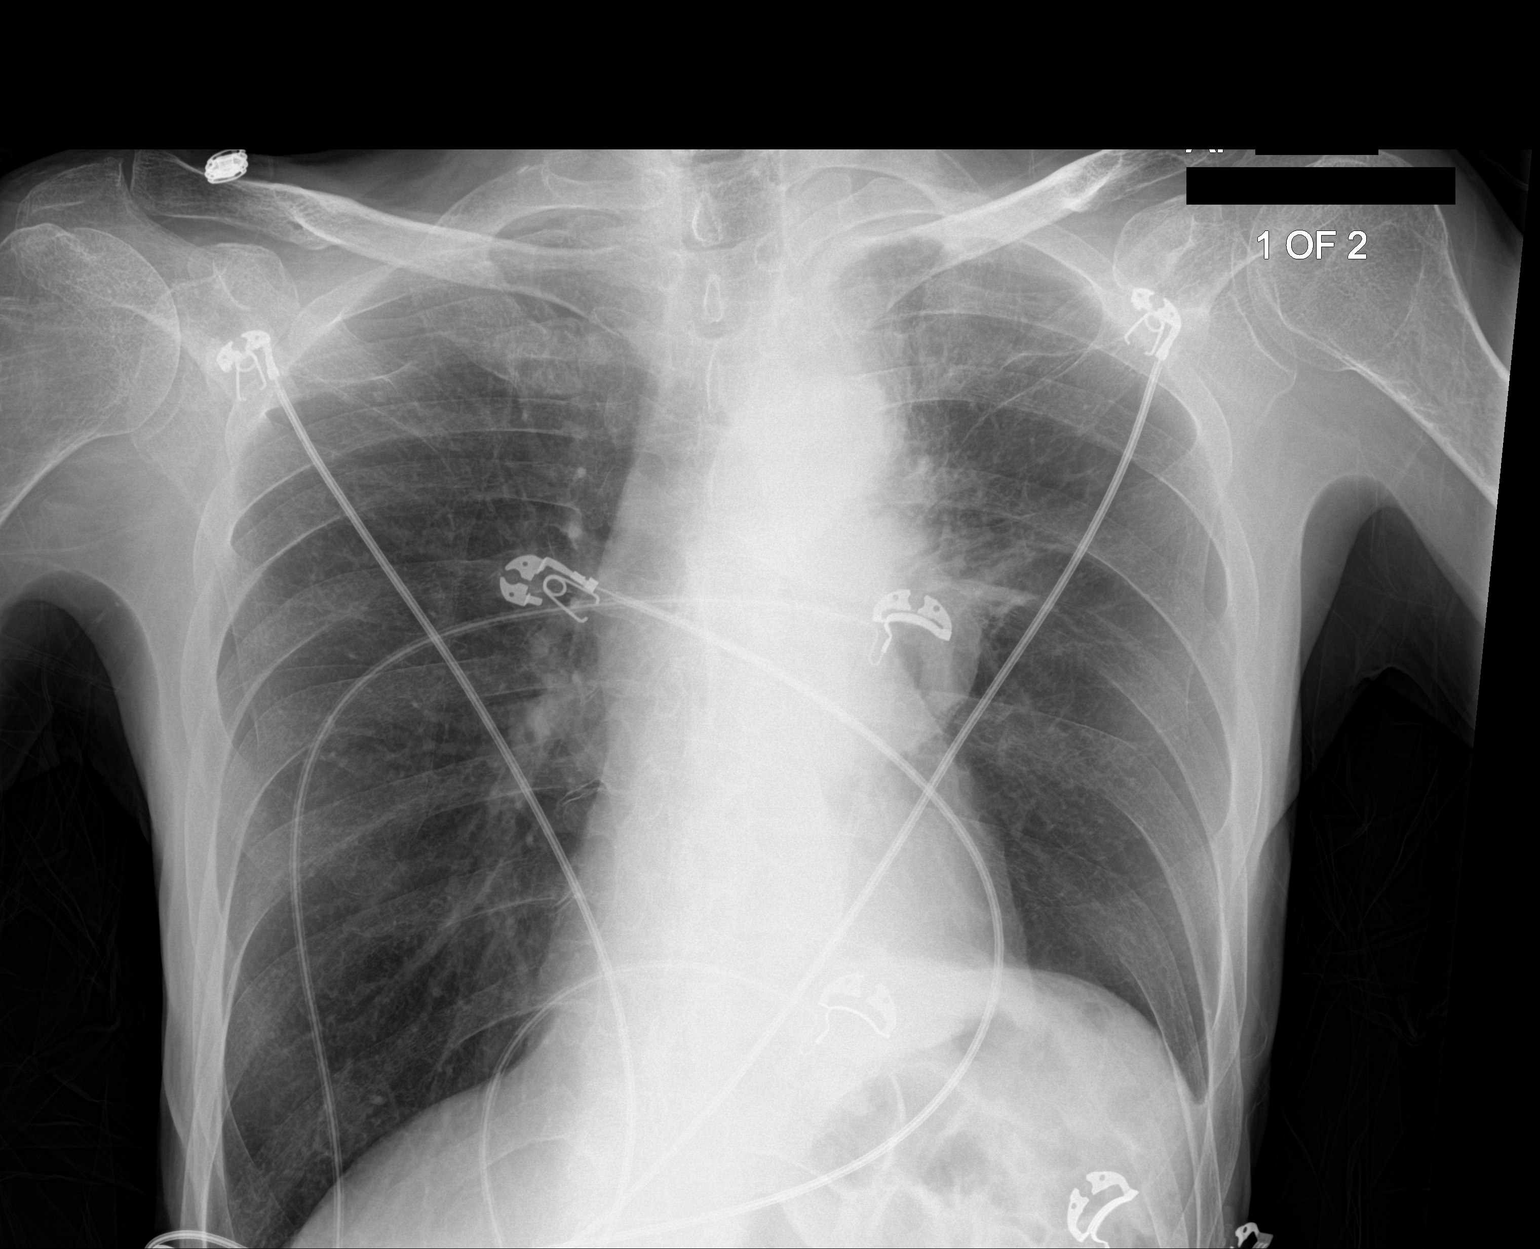

[2 of 2 positions shown; findings below may reference images not displayed]

FINDINGS: Left perihilar/mediastinal mass, progressive. Associated apical
pleural thickening versus fluid, new.

Right lung is clear.  No pneumothorax.

The heart is normal in size.
IMPRESSION: Left perihilar/mediastinal mass in this patient with known T4-6
tumor, progressive. Associated apical pleural thickening/versus
fluid, new.

## 2021-04-01 IMAGING — MR MR CERVICAL SPINE WO/W CM
7 of 8 series · 33 of 48 positions shown · IV contrast (5.5 ml gadavist)
Comparison: None.

CLINICAL DATA: Initial evaluation for metastatic disease
evaluation.

EXAM:
MRI CERVICAL SPINE WITHOUT AND WITH CONTRAST
TECHNIQUE: Multiplanar and multiecho pulse sequences of the cervical spine, to
include the craniocervical junction and cervicothoracic junction,
were obtained without and with intravenous contrast.
CONTRAST:  5.5mL GADAVIST GADOBUTROL 1 MMOL/ML IV SOLN

[Series 18: T1 · sagittal · 3.0mm · 0.69mm/px · 4 of 15 slices shown (1 of 2)]
[im 1/15]
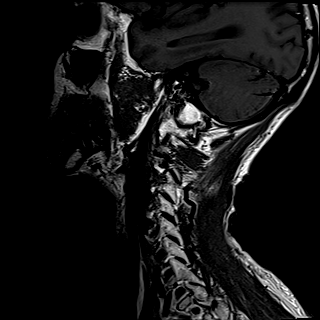
[im 5/15]
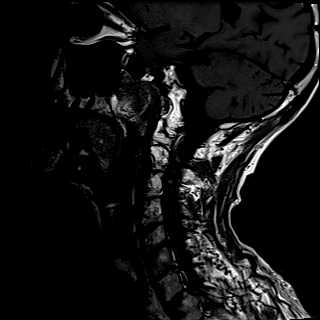
[im 10/15]
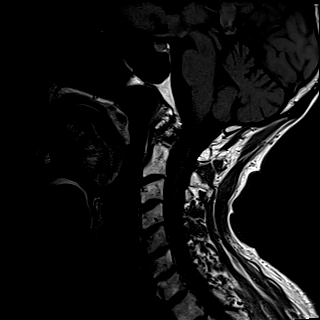
[im 15/15]
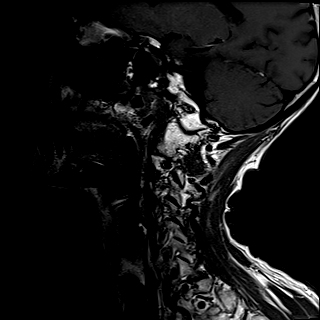

[Series 19: STIR · sagittal · 3.0mm · 0.86mm/px · 4 of 15 slices shown]
[im 1/15]
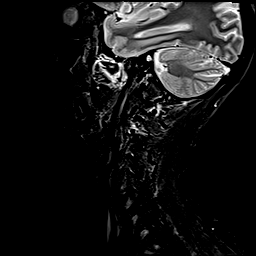
[im 5/15]
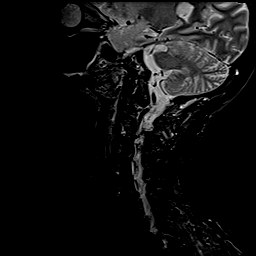
[im 10/15]
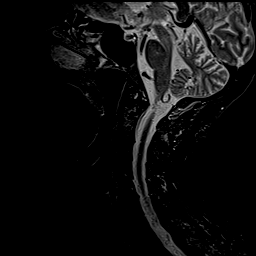
[im 15/15]
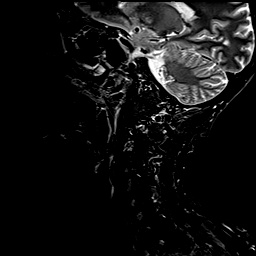

[Series 20: T2 · axial · 3.0mm · 0.70mm/px · z∈[-67,+33]mm · 8 of 30 slices shown]
[im 1/30]
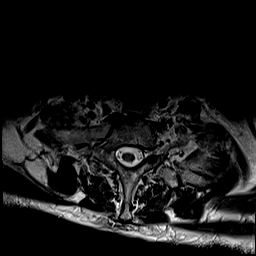
[im 5/30]
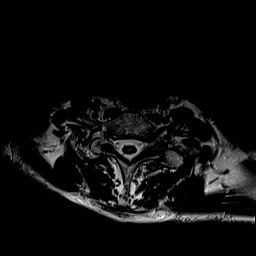
[im 9/30]
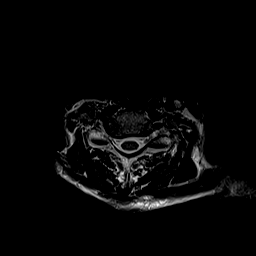
[im 13/30]
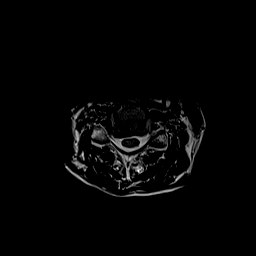
[im 17/30]
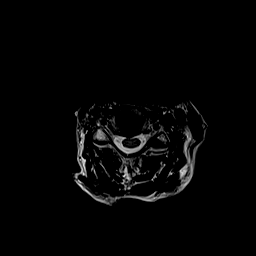
[im 21/30]
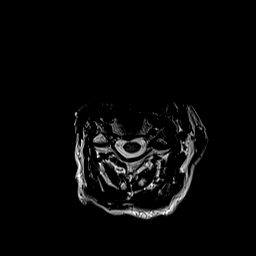
[im 25/30]
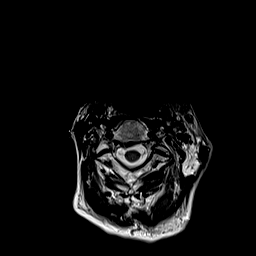
[im 30/30]
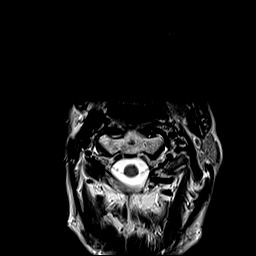

[Series 22: T1 · axial · 3.0mm · 0.35mm/px · z∈[-67,+33]mm · 8 of 30 slices shown (2 of 2)]
[im 1/30]
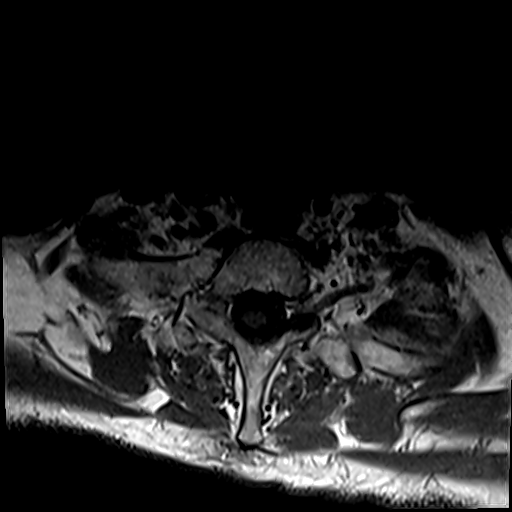
[im 5/30]
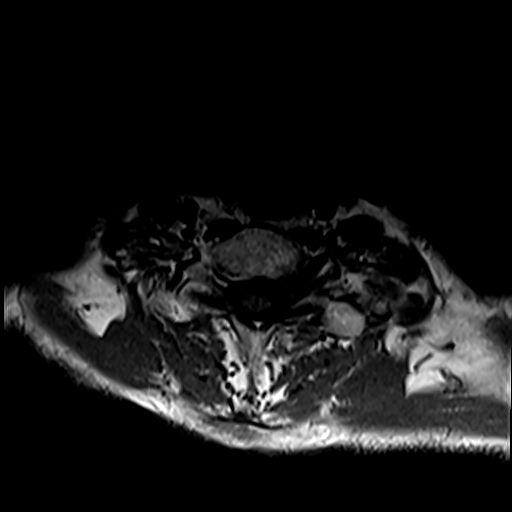
[im 9/30]
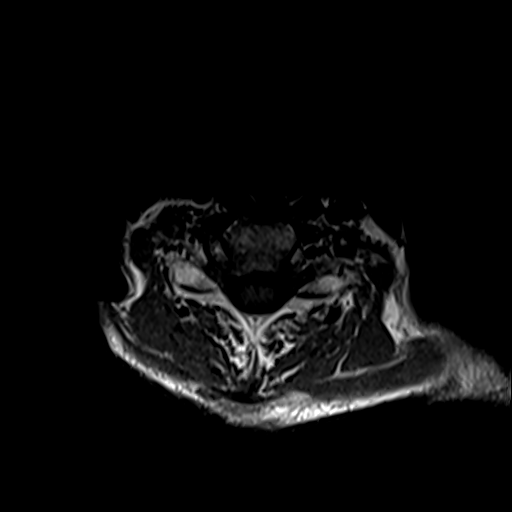
[im 13/30]
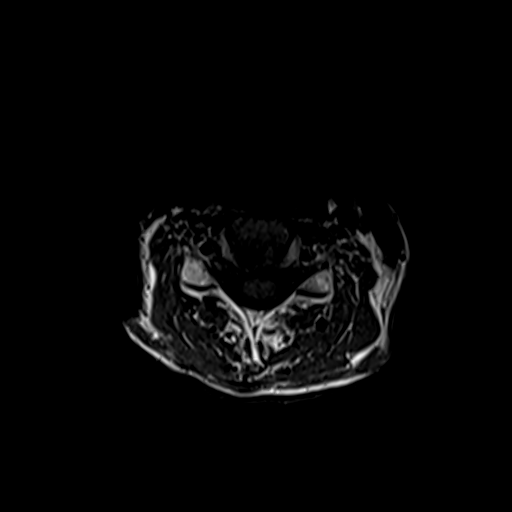
[im 17/30]
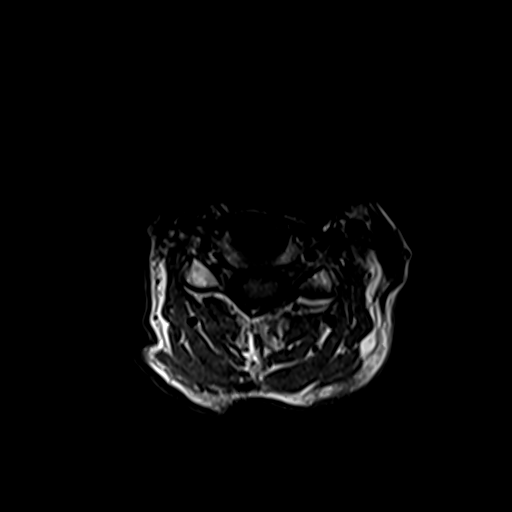
[im 21/30]
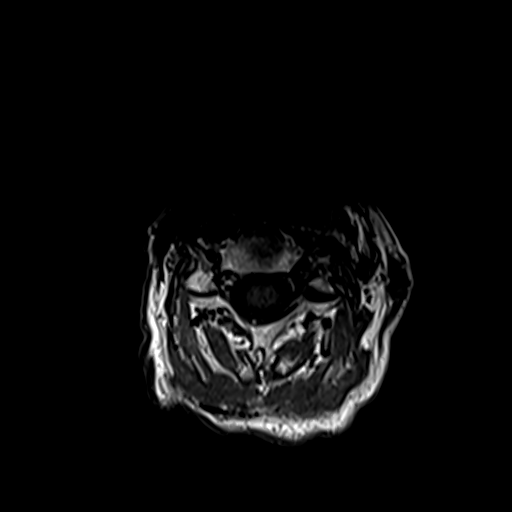
[im 25/30]
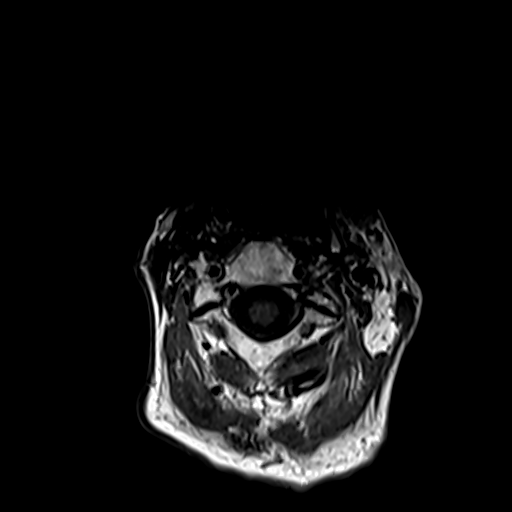
[im 30/30]
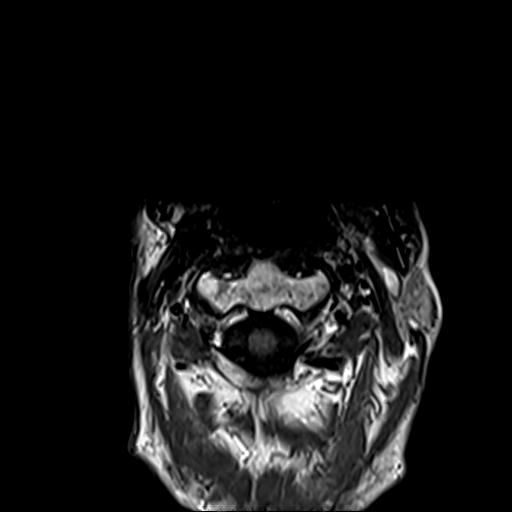

[Series 23: T2 post-contrast · sagittal · 3.0mm · 0.69mm/px · 4 of 15 slices shown]
[im 1/15]
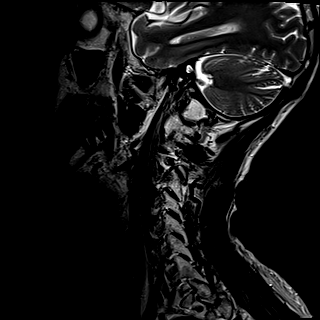
[im 5/15]
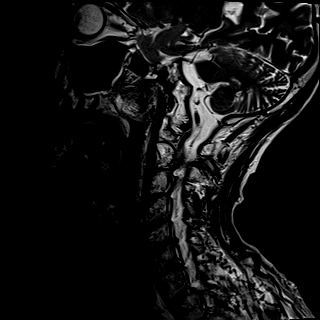
[im 10/15]
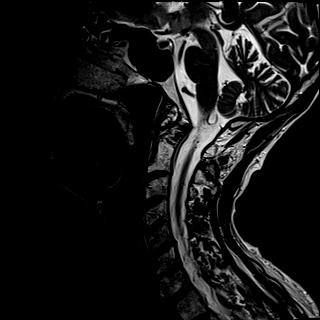
[im 15/15]
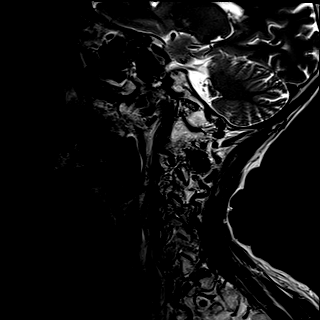

[Series 24: T1 fat-sat post-contrast · sagittal · 3.0mm · 0.69mm/px · 4 of 15 slices shown]
[im 1/15]
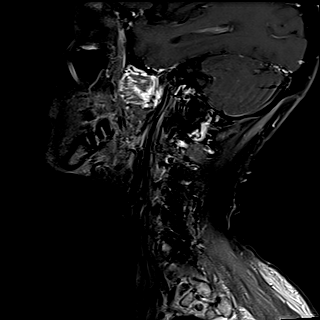
[im 5/15]
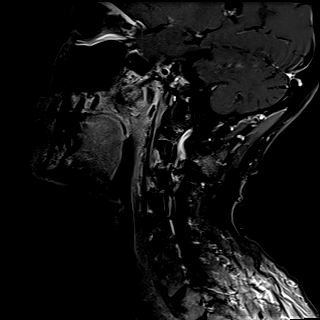
[im 10/15]
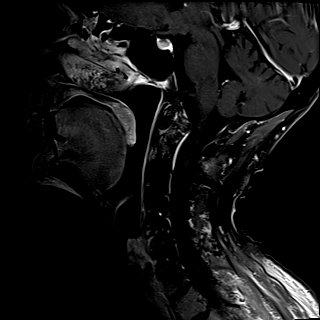
[im 15/15]
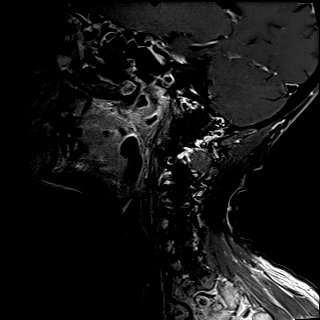

[Series 26: T1 post-contrast · axial · 3.0mm · 0.35mm/px · 1 of 30 slices shown]
[im 1/30]
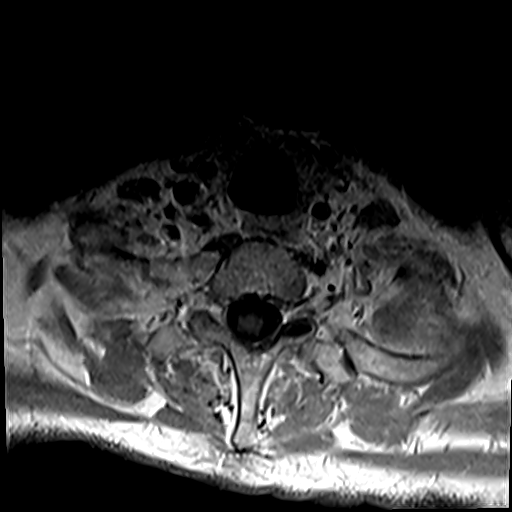

[33 of 48 positions shown; findings below may reference images not displayed]

FINDINGS: Alignment: Physiologic with preservation of the normal cervical
lordosis. No listhesis.

Vertebrae: Vertebral body height maintained without acute or chronic
fracture. Bone marrow signal intensity within normal limits. No
discrete osseous lesions or evidence for metastatic disease. No
abnormal edema or enhancement.

Cord: Normal signal morphology.

Posterior Fossa, vertebral arteries, paraspinal tissues: Visualized
brain and posterior fossa within normal limits. Craniocervical
junction normal. Paraspinous soft tissues within normal limits.
Normal flow voids seen within the vertebral arteries bilaterally.

Disc levels:

C2-C3: Right paracentral disc protrusion indents the right ventral
thecal sac. No significant spinal stenosis or cord impingement.
Foramina remain patent.

C3-C4: Right paracentral disc protrusion indents the right ventral
thecal sac. No significant spinal stenosis or cord impingement.
Foramina remain patent.

C4-C5: Mild degenerative intervertebral disc space narrowing.
Superimposed right paracentral disc protrusion indents the ventral
thecal sac, contacting and minimally flattening the ventral cord.
Thecal sac remains widely patent without significant spinal
stenosis. Bilateral uncovertebral spurring with resultant mild right
C5 foraminal stenosis. Left neural foramen remains patent.

C5-C6: Mild degenerative intervertebral disc space narrowing. Right
eccentric disc bulge with associated right greater than left
uncovertebral spurring. Broad posterior disc osteophyte mildly
flattens the ventral thecal sac without significant spinal stenosis.
Moderate right C6 foraminal narrowing. Left neural foramina remains
patent.

C6-C7: Mild degenerative intervertebral disc space narrowing. Right
eccentric disc osteophyte complex mildly flattens the ventral thecal
sac without significant spinal stenosis. Mild to moderate right C7
foraminal narrowing. Left neural foramen remains patent.

C7-T1: Minimal disc bulge. Mild facet hypertrophy. No spinal
stenosis. Foramina remain patent.
IMPRESSION: 1. No evidence for metastatic disease within the cervical spine.
2. Small right paracentral disc protrusions at C2-3 through C4-5
without significant spinal stenosis.
3. Right eccentric disc osteophyte complexes at C5-6 and C6-7 with
resultant mild to moderate right C6 and C7 foraminal stenosis as
above.

## 2021-04-01 IMAGING — MR MR LUMBAR SPINE WO/W CM
6 of 8 series · 31 of 48 positions shown · IV contrast (gadavist)
Comparison: None available.

CLINICAL DATA: Initial evaluation for metastatic evaluation.

EXAM:
MRI LUMBAR SPINE WITHOUT AND WITH CONTRAST
TECHNIQUE: Multiplanar and multiecho pulse sequences of the lumbar spine were
obtained without and with intravenous contrast.
CONTRAST:  5.5mL GADAVIST GADOBUTROL 1 MMOL/ML IV SOLN

[Series 17: T1 · sagittal · 4.0mm · 1.72mm/px · 2 of 5 slices shown (1 of 3)]
[im 1/5]
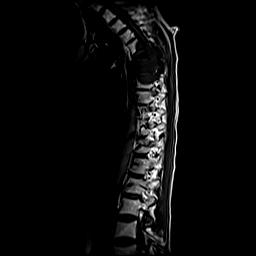
[im 5/5]
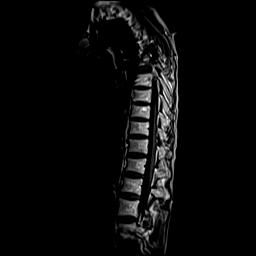

[Series 18: T1 · sagittal · 4.0mm · 0.81mm/px · 4 of 15 slices shown (2 of 3)]
[im 1/15]
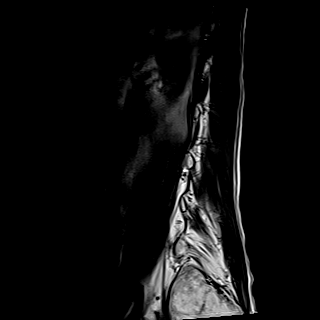
[im 5/15]
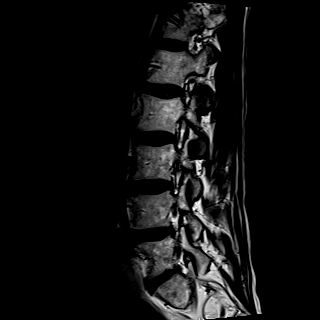
[im 10/15]
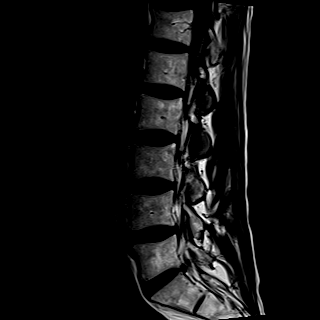
[im 15/15]
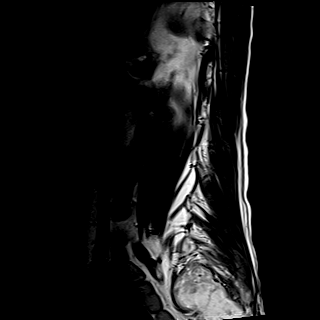

[Series 19: T2 · sagittal · 4.0mm · 0.81mm/px · 3 of 15 slices shown (1 of 2)]
[im 1/15]
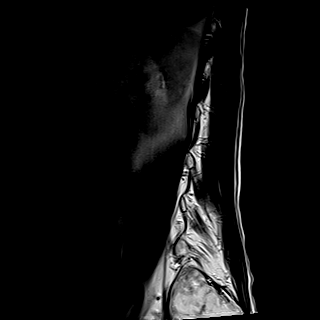
[im 8/15]
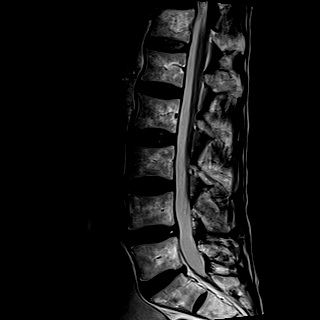
[im 15/15]
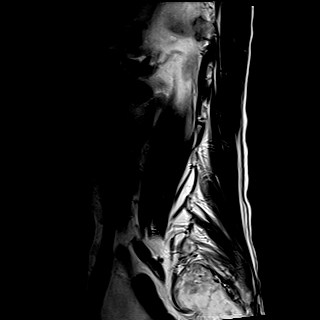

[Series 21: T2 · axial · 4.0mm · 0.62mm/px · z∈[-554,-318]mm · 11 of 49 slices shown (2 of 2)]
[im 1/49]
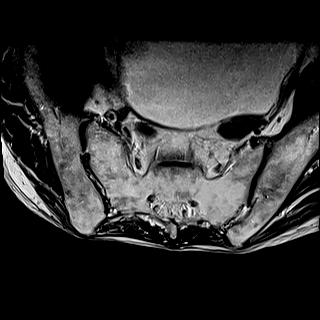
[im 5/49]
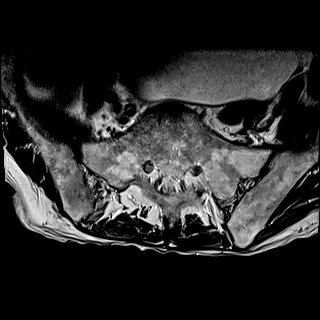
[im 10/49]
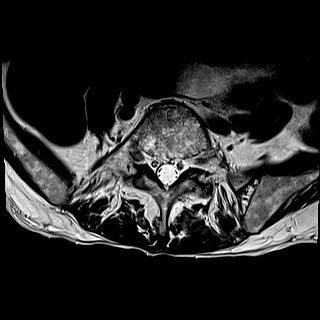
[im 15/49]
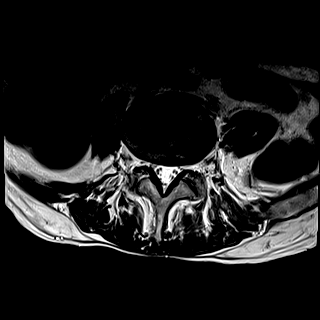
[im 20/49]
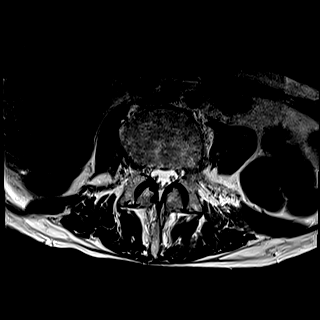
[im 25/49]
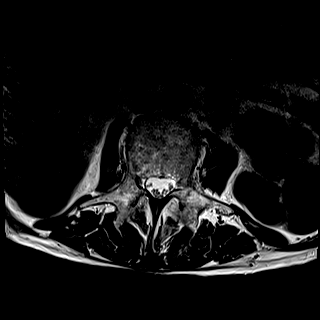
[im 29/49]
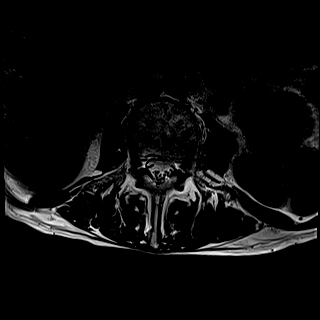
[im 34/49]
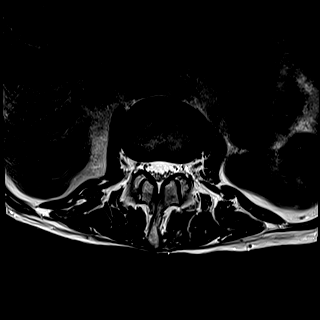
[im 39/49]
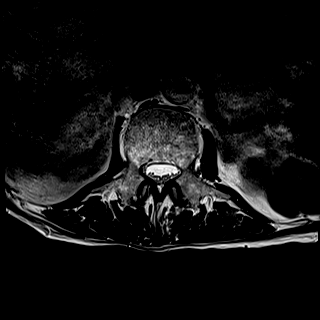
[im 44/49]
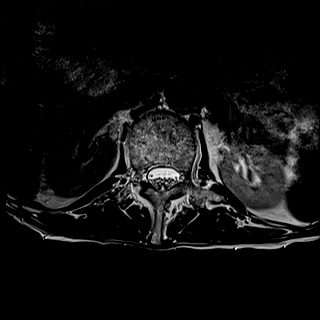
[im 49/49]
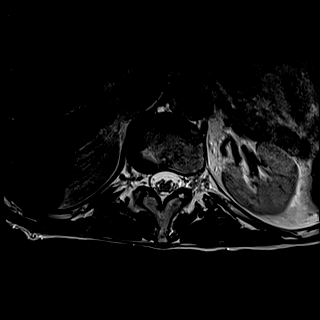

[Series 22: T1 · axial · 4.0mm · 0.39mm/px · z∈[-554,-318]mm · 8 of 49 slices shown (3 of 3)]
[im 1/49]
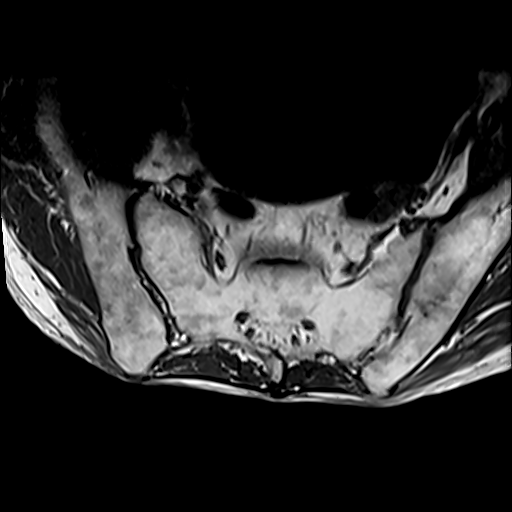
[im 10/49]
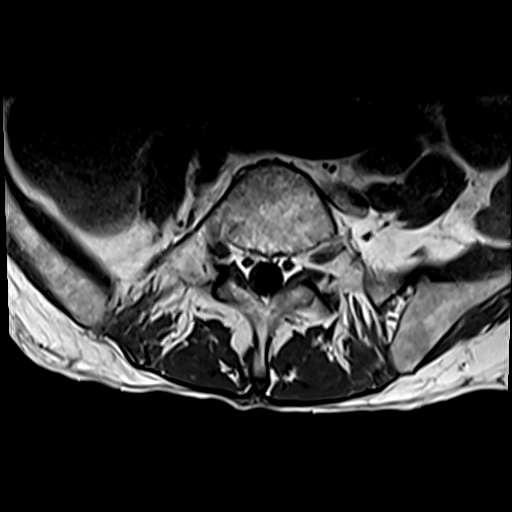
[im 15/49]
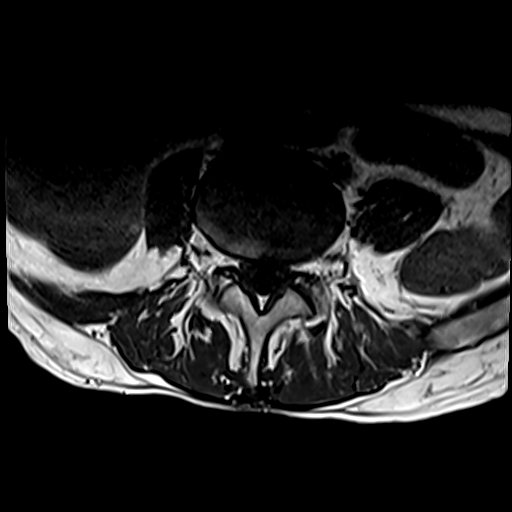
[im 20/49]
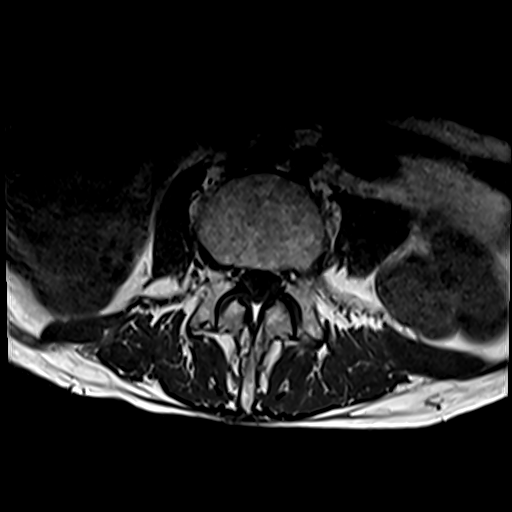
[im 29/49]
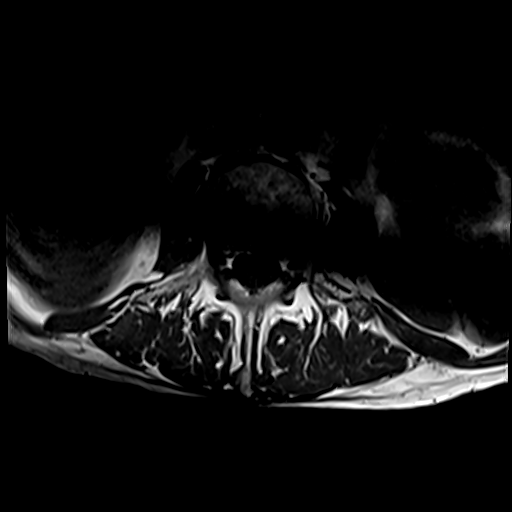
[im 34/49]
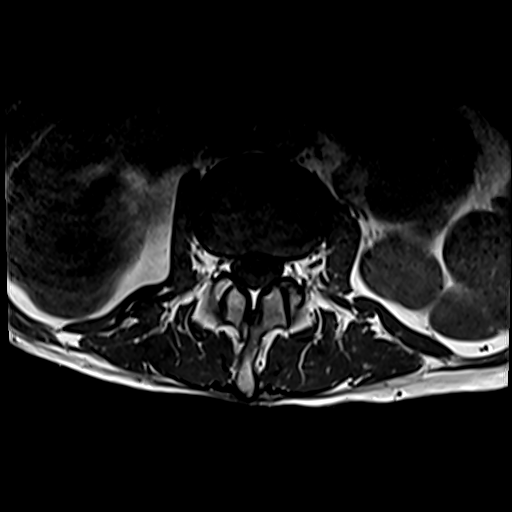
[im 39/49]
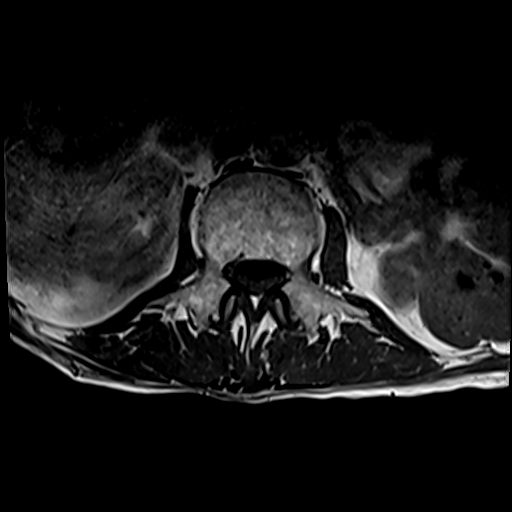
[im 49/49]
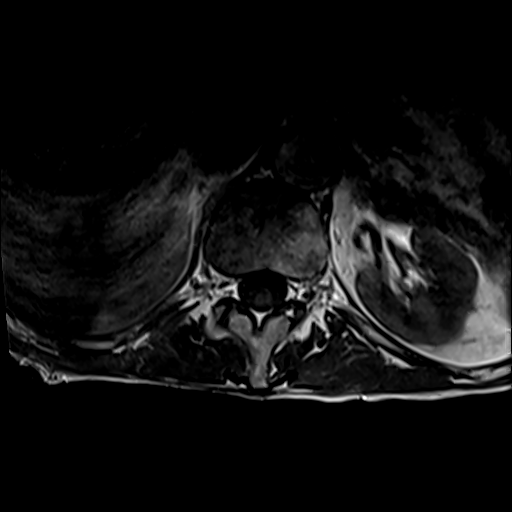

[Series 23: T1 fat-sat post-contrast · sagittal · 4.0mm · 0.81mm/px · 3 of 15 slices shown]
[im 1/15]
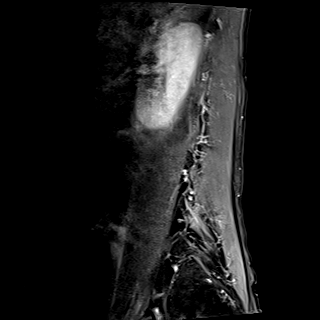
[im 8/15]
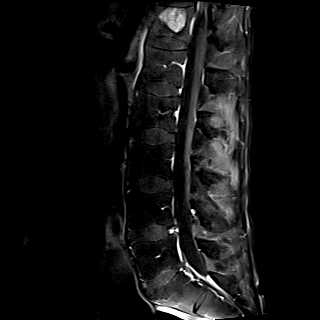
[im 15/15]
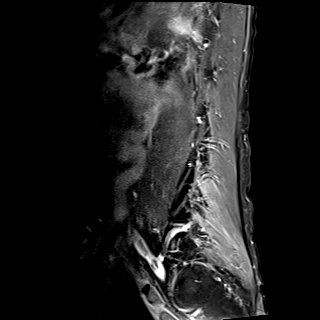

[31 of 48 positions shown; findings below may reference images not displayed]

FINDINGS: Segmentation: Standard. Lowest well-formed disc space labeled the
L5-S1 level.

Alignment: Physiologic with preservation of the normal lumbar
lordosis. No listhesis.

Vertebrae: Vertebral body height maintained without acute or chronic
fracture. Prominent benign hemangioma noted within the T12 vertebral
body. There is a 8 mm T1 hypointense enhancing lesion within the
inferior aspect of the L5 vertebral body, nonspecific, but could
reflect a small metastatic lesion (series 23, image 6). No other
evidence for metastatic disease seen within the lumbar spine.

Conus medullaris and cauda equina: Conus extends to the L1 level.
Conus and cauda equina appear normal.

Paraspinal and other soft tissues: Mild diffuse edema within the
lower posterior paraspinous musculature, nonspecific, but could
reflect muscular injury and/or strain. Soft tissues demonstrate no
other acute finding. Prominent distension of the partially
visualized urinary bladder. Visualized visceral structures otherwise
unremarkable.

Disc levels:

L1-2:  Negative interspace.  Mild facet hypertrophy.  No stenosis.

L2-3:  Negative interspace.  Mild facet hypertrophy.  No stenosis.

L3-4:  Negative interspace.  Mild facet hypertrophy.  No stenosis.

L4-5: Disc desiccation with minimal disc bulge. Superimposed tiny
central disc protrusion with annular fissure. Mild facet
hypertrophy. Mild narrowing of the lateral recesses. Central canal
remains patent. No foraminal stenosis.

L5-S1: Degenerative intervertebral disc space narrowing with disc
desiccation and mild diffuse disc bulge. Mild reactive endplate
spurring. Mild facet hypertrophy. No significant spinal stenosis.
Foramina remain patent.
IMPRESSION: 1. 8 mm enhancing lesion within the inferior aspect of the L5
vertebral body, nonspecific, but could reflect a small metastatic
lesion. Attention at follow-up recommended.
2. No other evidence for metastatic disease within the lumbar spine.
3. Mild diffuse edema within the lower posterior paraspinous
musculature, nonspecific, but could reflect muscular injury and/or
strain.
4. Prominent distension of the visualized urinary bladder. Query
urinary retention.
5. Mild for age spondylosis without significant stenosis.

## 2021-04-01 IMAGING — MR MR THORACIC SPINE WO/W CM
8 of 9 series · 30 of 48 positions shown · IV contrast (5.5 ml gadavist)
Comparison: None available.

CLINICAL DATA: Initial metastatic evaluation.

EXAM:
MRI THORACIC WITHOUT AND WITH CONTRAST
TECHNIQUE: Multiplanar and multiecho pulse sequences of the thoracic spine were
obtained without and with intravenous contrast.
CONTRAST:  5.5mL GADAVIST GADOBUTROL 1 MMOL/ML IV SOLN

[Series 16: T1 · sagittal · 4.0mm · 1.72mm/px · 1 of 5 slices shown (1 of 3)]
[im 1/5]
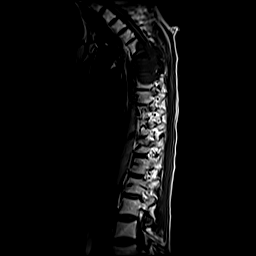

[Series 17: STIR · sagittal · 3.0mm · 1.00mm/px · 2 of 15 slices shown]
[im 1/15]
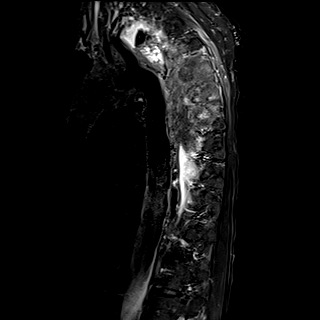
[im 15/15]
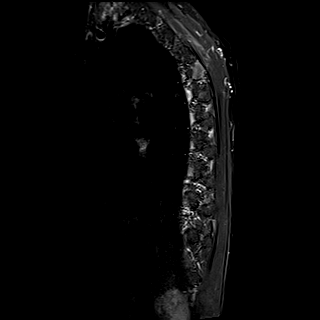

[Series 18: T1 · sagittal · 3.0mm · 1.00mm/px · 3 of 15 slices shown (2 of 3)]
[im 1/15]
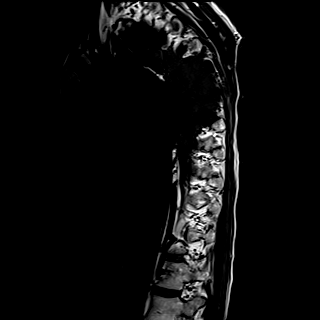
[im 8/15]
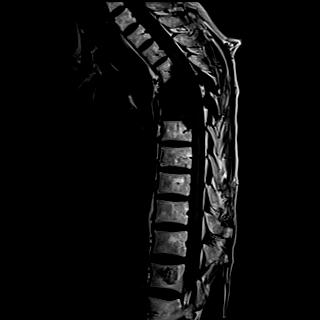
[im 15/15]
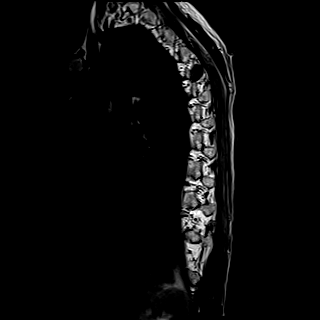

[Series 19: T2 · sagittal · 3.0mm · 0.83mm/px · 3 of 15 slices shown (1 of 2)]
[im 1/15]
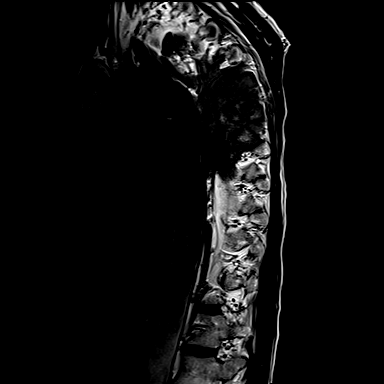
[im 8/15]
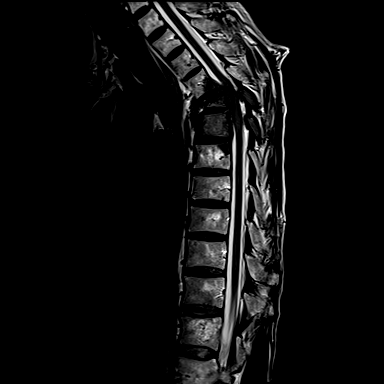
[im 15/15]
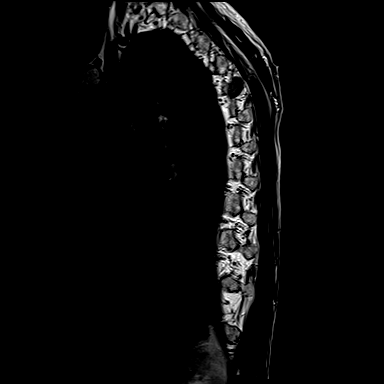

[Series 20: T2 · axial · 4.0mm · 0.78mm/px · z∈[-297,-86]mm · 9 of 47 slices shown (2 of 2)]
[im 1/47]
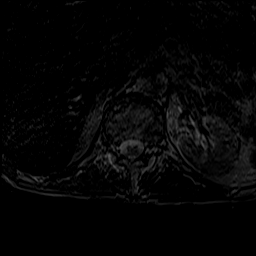
[im 6/47]
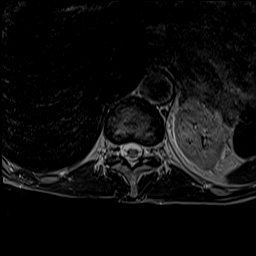
[im 12/47]
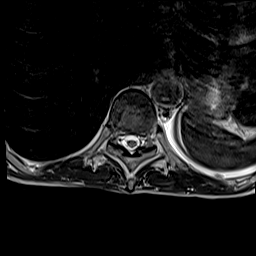
[im 18/47]
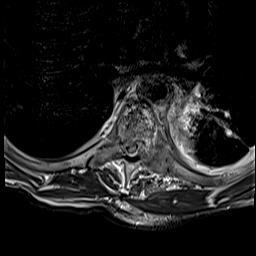
[im 24/47]
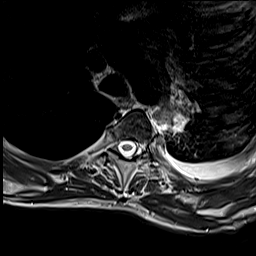
[im 29/47]
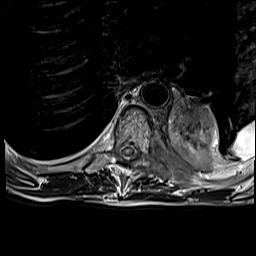
[im 35/47]
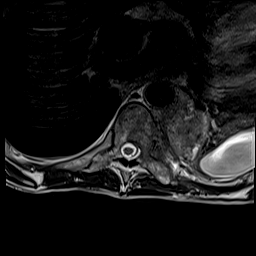
[im 41/47]
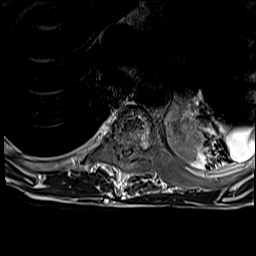
[im 47/47]
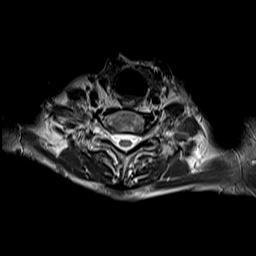

[Series 22: T1 · axial · 4.0mm · 0.39mm/px · z∈[-297,-86]mm · 8 of 47 slices shown (3 of 3)]
[im 1/47]
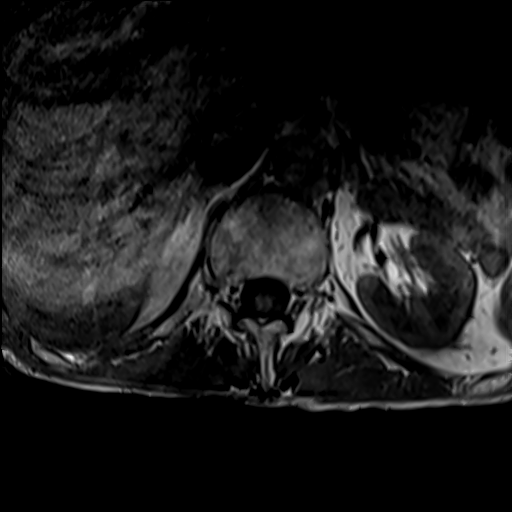
[im 6/47]
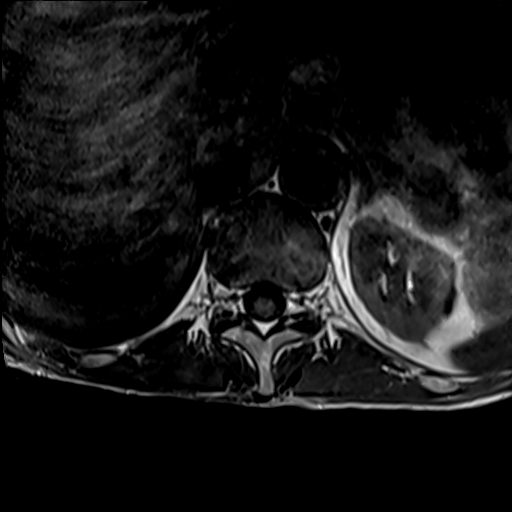
[im 12/47]
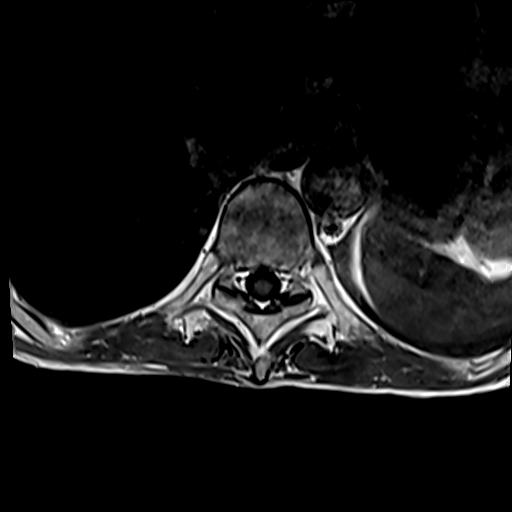
[im 18/47]
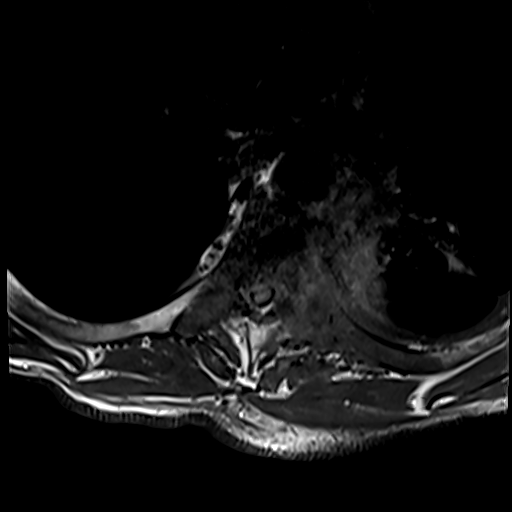
[im 29/47]
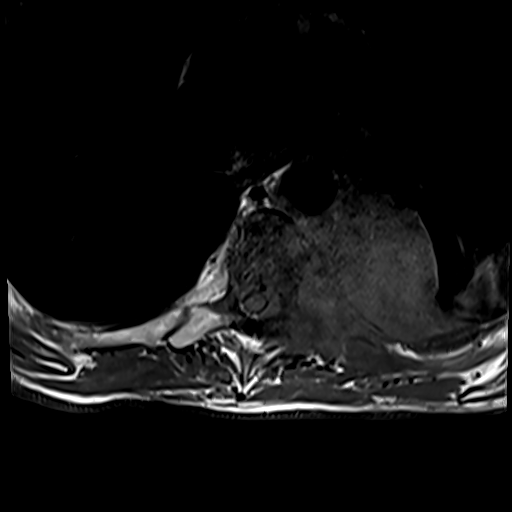
[im 35/47]
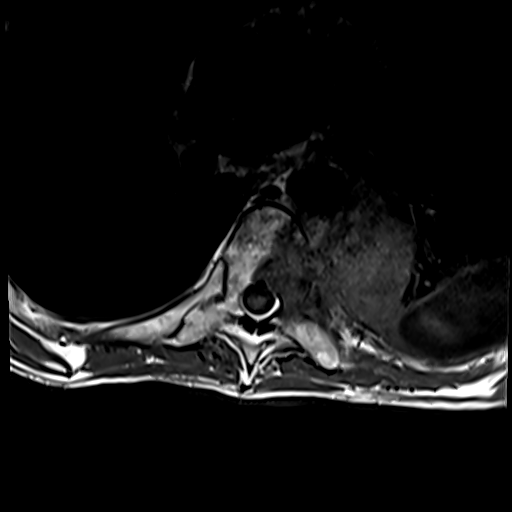
[im 41/47]
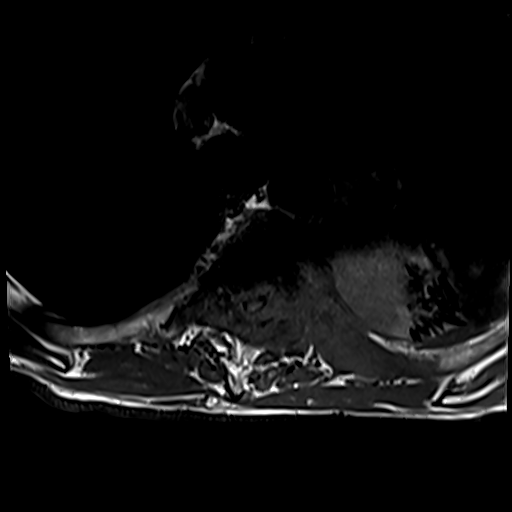
[im 47/47]
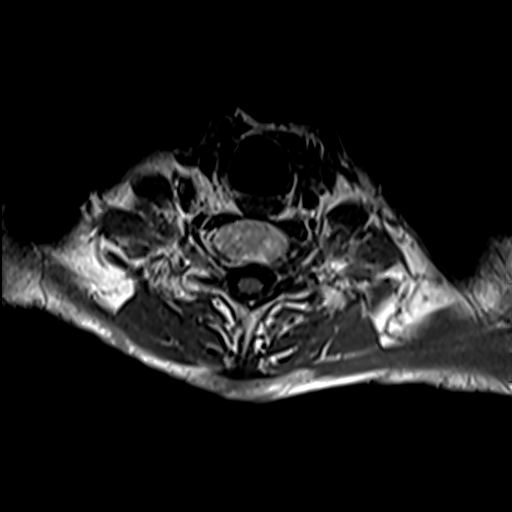

[Series 23: T1 fat-sat post-contrast · sagittal · 3.0mm · 1.00mm/px · 3 of 15 slices shown]
[im 1/15]
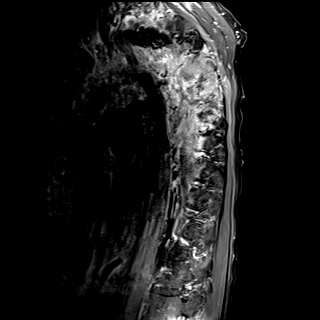
[im 8/15]
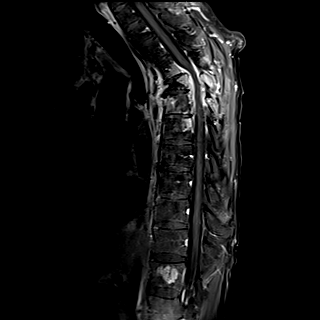
[im 15/15]
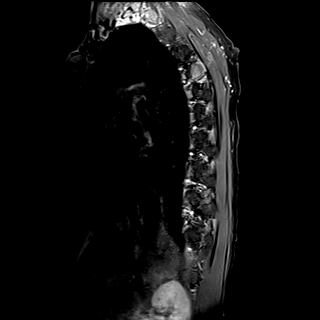

[Series 24: T1 post-contrast · axial · 4.0mm · 0.39mm/px · 1 of 47 slices shown]
[im 1/47]
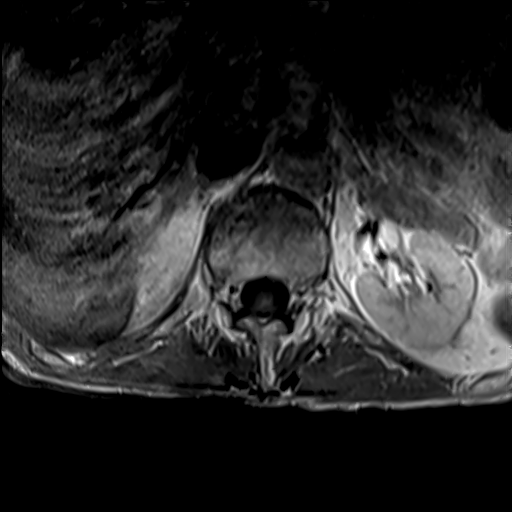

[30 of 48 positions shown; findings below may reference images not displayed]

FINDINGS: Alignment: Dextroscoliosis. Focal kyphotic angulation of the
thoracic spine at the level of T5. No listhesis.

Vertebrae: Large contiguous infiltrative and destructive metastatic
deposit seen involving the upper-midthoracic spine. Lesion involves
the T4 through T7 vertebral bodies, and is centered at the left
paraspinous region. Although measurements are somewhat difficult,
lesion measures approximately 7.7 x 8.4 x 10.9 cm in greatest
dimensions (AP by transverse by craniocaudad). Lesion involves the
left hilar/perihilar region, tracking posteriorly along the
posteromedial left lung, involving the adjacent left mediastinum and
partially encasing the aorta. Direct extension of this mass into the
adjacent posterior ribs and T4 through T7 vertebral bodies, with
involvement of their posterior elements at several levels. There is
an associated pathologic fracture of T5 with approximately 60%
height loss. Epidural extension of tumor to involve the spinal
canal, extending from T3 through T7. This is most pronounced at the
level of T5 where there is circumferential epidural involvement.
Associated severe spinal stenosis at the level of T5 with cord
compression and associated cord signal changes (series 17, image 8).
Thecal sac measures 4 mm in AP diameter at its most narrow point.
Tumor also extends to involve the left neural foramina at T4-5
through T6-7, with probable early extension into the left T3-4 and
T7-8 foramina. Involvement of the right T4-5 and T6-7 neural
foramina noted as well.

There is an additional 1 cm T1 hypointense enhancing lesion within
the T11 vertebral body, indeterminate, but could reflect an
additional metastatic lesion (series 19, image 7).

Otherwise, no other metastatic disease seen within the thoracic
spine. Multiple scattered benign hemangiomata noted. No other acute
or chronic fracture.

Cord: Epidural tumor with associated severe spinal stenosis and cord
signal changes at T4-5 through T6-7 as above, most pronounced at the
T5 level. Otherwise, signal intensity within the thoracic cord is
within normal limits. No other epidural involvement.

Paraspinal and other soft tissues: Metastatic lesion involving the
left paraspinous and posteromedial left lung at T4 through T7 as
above. Associated atelectasis with irregular and partially loculated
left pleural effusion partially visualized. Direct extension of
tumor to involve the left posterior soft tissues of the left mid
back noted as well.

Disc levels:

No significant underlying disc pathology for age. No other
significant stenosis or impingement.
IMPRESSION: 1. Large destructive and infiltrative metastatic deposit centered at
the left paraspinous region, with involvement of the T4 through T7
vertebral bodies as above. Associated epidural extension with severe
spinal stenosis, cord compression, and cord signal changes at the
levels of T4-5 through T6-7.
2. Metastatic lesion extends to involve the adjacent posteromedial
aspect of the left lung as well as the left adjacent mediastinum and
left hilar/perihilar region, also seen on corresponding CTA of the
chest.
3. Associated pathologic fracture of T5 with up to 60% height loss.
4. 1 cm enhancing lesion within the T11 vertebral body,
indeterminate, but could reflect an additional metastatic lesion.
Attention at follow-up recommended.

Critical Value/emergent results were called by telephone at the time
of interpretation on [DATE] at [DATE] to provider Dr. LAKRIJA ,
who verbally acknowledged these results.

## 2021-04-01 MED ORDER — MORPHINE SULFATE (PF) 4 MG/ML IV SOLN
4.0000 mg | Freq: Once | INTRAVENOUS | Status: AC
  Administered 2021-04-01: 4 mg via INTRAVENOUS
  Filled 2021-04-01: qty 1

## 2021-04-01 MED ORDER — GADOBUTROL 1 MMOL/ML IV SOLN
5.5000 mL | Freq: Once | INTRAVENOUS | Status: AC | PRN
  Administered 2021-04-01: 5.5 mL via INTRAVENOUS

## 2021-04-01 MED ORDER — ONDANSETRON HCL 4 MG/2ML IJ SOLN
4.0000 mg | Freq: Once | INTRAMUSCULAR | Status: AC
  Administered 2021-04-01: 4 mg via INTRAVENOUS
  Filled 2021-04-01: qty 2

## 2021-04-01 NOTE — ED Notes (Signed)
Patient transported to MRI 

## 2021-04-01 NOTE — ED Provider Notes (Signed)
[facility] DEPT Provider Note   CSN: [id number] Arrival date & time: 04/01/21  1640     History Chief Complaint  Patient presents with   Bradley Oliver is a 65 y.o. male.  This is a 65 y.o. 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  Patient reports that approximately 8 days ago he began to experience numbness below his sternum, nipple area.  Numbness extends down bilateral legs.  Difficulty with ambulation.  Difficulty with urination.  Feels he has been unable to get out of bed the past few days.  Symptoms have been constant.  Reports he has not had a bowel movement in multiple days.  Feels numbness symmetrically down both legs. No fevers or chills, no IV drug use.  He also reports intermittent chest tightness over the past few days, mild difficulty with inspiration, deep breathing.   The history is provided by the patient. No language interpreter was used.  Fall Pertinent negatives include no chest pain, no abdominal pain, no headaches and no shortness of breath.      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    Past Surgical History:  Procedure Laterality Date   EYE SURGERY     KNEE SURGERY     tonsil biopsy Right 08/05/14   invasive squamous cell carcinoma       Family History  Problem Relation Age of Onset   Hypertension Mother    Diabetes Father    Diabetes Paternal Grandmother    Diabetes Paternal Grandfather     Social History   Tobacco Use   Smoking status: Former   Smokeless tobacco: Never   Tobacco comments:    smoked for short  time as teenager  Substance Use Topics   Alcohol use: Not Currently   Drug use: Never    Home Medications Prior to Admission medications   Medication Sig Start Date End Date Taking? Authorizing Provider  albuterol (PROVENTIL HFA;VENTOLIN HFA) 108 (90 Base) MCG/ACT inhaler USE 2 PUFFS 4 TIMES A DAY AS NEEDED SOB AND WHEEZING 08/09/17   [provider]  benzocaine (HURRICAINE) 20 % oral spray Use as directed 1 application in the mouth or throat 4 (four) times daily as needed for throat irritation / pain. 11/09/17   Charlann Lange, PA-C  cefdinir (OMNICEF) 300 MG capsule Take 1 capsule (300 mg total) by mouth every 12 (twelve) hours. 11/19/17   Mikhail, Velta Addison, DO  dexamethasone (DECADRON) 4 MG tablet TAKE 2 TABLETS AT 8AM 1 DAY BEFORE CHEMO, 2 TABS DAY AFTER CHEMO AND 1 TAB 2ND DAY AFTER CHEMO 10/26/17   [provider]  Multiple Vitamins-Minerals (MULTIVITAMIN ADULT) TABS Take 1 tablet by mouth daily.    [provider]  prochlorperazine (COMPAZINE) 10 MG tablet TAKE 1 TABLET EVERY 6 HOURS AS NEEDED FOR NAUSEA OR VOMITING 10/26/17   [provider]  ranitidine (ZANTAC) 150 MG tablet Take by mouth. 09/12/14 09/12/15  [provider]    Allergies    Patient has no known allergies.  Review of Systems   Review of Systems  Constitutional:  Negative for chills and fever.  HENT:  Negative for facial swelling and trouble swallowing.   Eyes:  Negative for photophobia and visual disturbance.  Respiratory:  Positive for chest tightness. Negative for cough and shortness of breath.   Cardiovascular:  Negative for chest pain and palpitations.  Gastrointestinal:  Positive for constipation. Negative for abdominal pain, nausea and vomiting.  Endocrine: Negative for polydipsia and polyuria.  Genitourinary:  Positive for difficulty urinating. Negative for hematuria.  Musculoskeletal:  Negative for gait problem and joint swelling.  Skin:  Negative for pallor and rash.   Neurological:  Positive for weakness and numbness. Negative for syncope and headaches.  Psychiatric/Behavioral:  Negative for agitation and confusion.    Physical Exam Updated Vital Signs BP 120/84   Pulse 100   Temp 98.1 F (36.7 C)   Resp (!) 25   Ht 5\' 10"  (1.778 m)   Wt 49.9 kg   SpO2 99%   BMI 15.78 kg/m   Physical Exam Vitals and nursing note reviewed.  Constitutional:      General: He is not in acute distress.    Appearance: He is well-developed.     Comments: Cachectic appearing  HENT:     Head: Normocephalic and atraumatic.     Right Ear: External ear normal.     Left Ear: External ear normal.     Mouth/Throat:     Mouth: Mucous membranes are moist.  Eyes:     General: No scleral icterus.    Extraocular Movements: Extraocular movements intact.     Pupils: Pupils are equal, round, and reactive to light.  Cardiovascular:     Rate and Rhythm: Normal rate and regular rhythm.     Pulses: Normal pulses.     Heart sounds: Normal heart sounds.  Pulmonary:     Effort: Pulmonary effort is normal. No respiratory distress.     Breath sounds: Normal breath sounds.  Abdominal:     General: Abdomen is flat.     Palpations: Abdomen is soft.     Tenderness: There is no abdominal tenderness.  Musculoskeletal:        General: Normal range of motion.     Cervical back: Normal range of motion.     Right lower leg: No edema.     Left lower leg: No edema.     Comments: Mild ttp upper thoracic and mid thoracic spine, some ttp to lumbar spine; all midline or just adjacent to midline  Skin:    General: Skin is warm and dry.     Capillary Refill: Capillary refill takes less than 2 seconds.  Neurological:     Mental Status: He is alert and oriented to person, place, and time.     GCS: GCS eye subscore is 4. GCS verbal subscore is 5. GCS motor subscore is 6.     Cranial Nerves: Cranial nerves 2-12 are intact. No facial asymmetry.     Sensory: Sensory deficit present.     Motor:  Weakness present.     Coordination: Coordination is intact. Romberg sign negative.     Comments: Diminished sensation to two-point discrimination beginning below the mammary line and extending to bilateral lower extremities. B/l weakness to both LE. 3/5  Psychiatric:        Mood and Affect: Mood normal.        Behavior: Behavior normal.    ED Results / Procedures / Treatments   Labs (all labs ordered are listed, but only abnormal results are  displayed) Labs Reviewed  CBC WITH DIFFERENTIAL/PLATELET - Abnormal; Notable for the following components:      Result Value   Lymphs Abs 0.5 (*)    All other components within normal limits  COMPREHENSIVE METABOLIC PANEL - Abnormal; Notable for the following components:   Potassium 3.4 (*)    All other components within normal limits  URINALYSIS, ROUTINE W REFLEX MICROSCOPIC - Abnormal; Notable for the following components:   Ketones, ur 5 (*)    All other components within normal limits  D-DIMER, QUANTITATIVE - Abnormal; Notable for the following components:   D-Dimer, Quant 1.69 (*)    All other components within normal limits  CBG MONITORING, ED - Abnormal; Notable for the following components:   Glucose-Capillary 104 (*)    All other components within normal limits  TROPONIN I (HIGH SENSITIVITY)  TROPONIN I (HIGH SENSITIVITY)    EKG EKG Interpretation  Date/Time:  Thursday April 01 2021 19:28:12 EST Ventricular Rate:  77 PR Interval:  119 QRS Duration: 83 QT Interval:  373 QTC Calculation: 423 R Axis:   80 Text Interpretation: Sinus rhythm Borderline short PR interval Borderline T wave abnormalities No prior tracing Confirmed by Wynona Dove ([id number]) on 04/02/2021 12:22:27 AM  Radiology DG Chest Portable 1 View  Result Date: 04/01/2021 CLINICAL DATA:  Chest tightness, tumor at T4-6 EXAM: PORTABLE CHEST 1 VIEW COMPARISON:  11/09/2017 FINDINGS: Left perihilar/mediastinal mass, progressive. Associated apical pleural thickening  versus fluid, new. Right lung is clear.  No pneumothorax. The heart is normal in size. IMPRESSION: Left perihilar/mediastinal mass in this patient with known T4-6 tumor, progressive. Associated apical pleural thickening/versus fluid, new. Electronically Signed   By: Julian Hy M.D.   On: 04/01/2021 20:24    Procedures Procedures   Medications Ordered in ED Medications  sodium chloride 0.9 % bolus 1,000 mL (has no administration in time range)  potassium chloride SA (KLOR-CON) CR tablet 40 mEq (has no administration in time range)  HYDROcodone-acetaminophen (NORCO/VICODIN) 5-325 MG per tablet 1 tablet (has no administration in time range)  morphine 4 MG/ML injection 4 mg (4 mg Intravenous Given 04/01/21 2157)  ondansetron (ZOFRAN) injection 4 mg (4 mg Intravenous Given 04/01/21 2156)  gadobutrol (GADAVIST) 1 MMOL/ML injection 5.5 mL (5.5 mLs Intravenous Contrast Given 04/01/21 2245)    ED Course  I have reviewed the triage vital signs and the nursing notes.  Pertinent labs & imaging results that were available during my care of the patient were reviewed by me and considered in my medical decision making (see chart for details).    MDM Rules/Calculators/A&P                           CC: back pain, weakness  This patient complains of above; this involves an extensive number of treatment options and is a complaint that carries with it a high risk of complications and morbidity. Vital signs were reviewed. Serious etiologies considered.  Neurologic deficit with sensation deficit below mammary line and weakness extending down both legs. Hx of thoracic spine neoplasm, will obtain MRI to evaluate for spinal cord compression.   Patient has known neoplasm to his thoracic spine.  He has discomfort along the majority of his spine.  Will obtain imaging C, T, and L spine. High suspicion for invasion of neoplasm into spinal cord.   He is low risk Wells score, D-dimer is ordered and is  elevated.  We will obtain CT PE  Record  review:  Previous records obtained and reviewed   Work up as above, notable for:  Labs & imaging results that were available during my care of the patient were reviewed by me and considered in my medical decision making.  Management: Analgesics, IVF   Patient is pending MRI results at this time.  Also pending CT PE.  If MRI is abnormal with spinal cord involvement of his known invasive squamous cell carcinoma recommend neurosurgery evaluation and admission for further management.  If CT PE is positive for clinically significant embolism recommend admission for anticoagulation. Low suspicion for DVT. If imaging is negative favor discharge.  Patient signed out to incoming physician at this time.         This chart was dictated using voice recognition software.  Despite best efforts to proofread,  errors can occur which can change the documentation meaning.  Final Clinical Impression(s) / ED Diagnoses Final diagnoses:  Elevated d-dimer  Weakness  Dyspnea, unspecified type    Rx / DC Orders ED Discharge Orders     None        Jeanell Sparrow, DO 04/02/21 [id number]

## 2021-04-01 NOTE — ED Notes (Signed)
Holding medications until pt about to depart for MRI, Provider aware

## 2021-04-01 NOTE — ED Triage Notes (Signed)
Pt presents to the ED from home following a fall. Per EMS, the pt has a tumor to the T4-T6 area of his spine. The pt lives at home with family and per family the pt has had a progressive loss of appetite and progressive weakness over the past two weeks. The pt states he is not currently undergoing any tx for his tumor. Pt denies LOC, dizziness, or hitting his head when he fell and denies any new pain throughout the body. Pt states he lost his balance getting off of the couch and caught himself with his right arm on a chair cushion.

## 2021-04-01 NOTE — ED Notes (Signed)
Bladder scan 52 mL

## 2021-04-02 ENCOUNTER — Other Ambulatory Visit: Payer: Self-pay

## 2021-04-02 ENCOUNTER — Inpatient Hospital Stay (HOSPITAL_COMMUNITY): Payer: Medicare PPO

## 2021-04-02 ENCOUNTER — Inpatient Hospital Stay (HOSPITAL_COMMUNITY): Payer: Medicare PPO | Admitting: Certified Registered Nurse Anesthetist

## 2021-04-02 ENCOUNTER — Encounter (HOSPITAL_COMMUNITY)
Admission: EM | Disposition: A | Discharge status: Skilled Nursing Facility | Payer: Self-pay | Source: Home / Self Care | Attending: Family Medicine

## 2021-04-02 ENCOUNTER — Encounter (HOSPITAL_COMMUNITY): Payer: Self-pay

## 2021-04-02 ENCOUNTER — Emergency Department (HOSPITAL_COMMUNITY): Payer: Medicare PPO

## 2021-04-02 ENCOUNTER — Ambulatory Visit
Admit: 2021-04-02 | Discharge: 2021-04-02 | Disposition: A | Discharge status: Home / Self Care | Payer: Medicare PPO | Source: Ambulatory Visit | Attending: Radiation Oncology | Admitting: Radiation Oncology

## 2021-04-02 DIAGNOSIS — C7801 Secondary malignant neoplasm of right lung: Secondary | ICD-10-CM | POA: Diagnosis present

## 2021-04-02 DIAGNOSIS — C7802 Secondary malignant neoplasm of left lung: Secondary | ICD-10-CM | POA: Diagnosis present

## 2021-04-02 DIAGNOSIS — Z923 Personal history of irradiation: Secondary | ICD-10-CM | POA: Diagnosis not present

## 2021-04-02 DIAGNOSIS — G9529 Other cord compression: Secondary | ICD-10-CM | POA: Diagnosis present

## 2021-04-02 DIAGNOSIS — G9741 Accidental puncture or laceration of dura during a procedure: Secondary | ICD-10-CM | POA: Diagnosis not present

## 2021-04-02 DIAGNOSIS — M4804 Spinal stenosis, thoracic region: Secondary | ICD-10-CM | POA: Diagnosis present

## 2021-04-02 DIAGNOSIS — C099 Malignant neoplasm of tonsil, unspecified: Secondary | ICD-10-CM | POA: Diagnosis present

## 2021-04-02 DIAGNOSIS — K5900 Constipation, unspecified: Secondary | ICD-10-CM | POA: Diagnosis present

## 2021-04-02 DIAGNOSIS — J90 Pleural effusion, not elsewhere classified: Secondary | ICD-10-CM | POA: Diagnosis present

## 2021-04-02 DIAGNOSIS — G9520 Unspecified cord compression: Secondary | ICD-10-CM

## 2021-04-02 DIAGNOSIS — Z833 Family history of diabetes mellitus: Secondary | ICD-10-CM | POA: Diagnosis not present

## 2021-04-02 DIAGNOSIS — E876 Hypokalemia: Secondary | ICD-10-CM

## 2021-04-02 DIAGNOSIS — Y658 Other specified misadventures during surgical and medical care: Secondary | ICD-10-CM | POA: Diagnosis not present

## 2021-04-02 DIAGNOSIS — E43 Unspecified severe protein-calorie malnutrition: Secondary | ICD-10-CM | POA: Diagnosis present

## 2021-04-02 DIAGNOSIS — R7989 Other specified abnormal findings of blood chemistry: Secondary | ICD-10-CM | POA: Diagnosis not present

## 2021-04-02 DIAGNOSIS — Z7189 Other specified counseling: Secondary | ICD-10-CM | POA: Diagnosis not present

## 2021-04-02 DIAGNOSIS — Z681 Body mass index (BMI) 19 or less, adult: Secondary | ICD-10-CM | POA: Diagnosis not present

## 2021-04-02 DIAGNOSIS — Z87891 Personal history of nicotine dependence: Secondary | ICD-10-CM | POA: Diagnosis not present

## 2021-04-02 DIAGNOSIS — Z79890 Hormone replacement therapy (postmenopausal): Secondary | ICD-10-CM | POA: Diagnosis not present

## 2021-04-02 DIAGNOSIS — M8458XA Pathological fracture in neoplastic disease, other specified site, initial encounter for fracture: Secondary | ICD-10-CM | POA: Diagnosis present

## 2021-04-02 DIAGNOSIS — Z66 Do not resuscitate: Secondary | ICD-10-CM | POA: Diagnosis present

## 2021-04-02 DIAGNOSIS — E039 Hypothyroidism, unspecified: Secondary | ICD-10-CM | POA: Diagnosis present

## 2021-04-02 DIAGNOSIS — M546 Pain in thoracic spine: Secondary | ICD-10-CM | POA: Diagnosis present

## 2021-04-02 DIAGNOSIS — D492 Neoplasm of unspecified behavior of bone, soft tissue, and skin: Secondary | ICD-10-CM | POA: Diagnosis not present

## 2021-04-02 DIAGNOSIS — Z515 Encounter for palliative care: Secondary | ICD-10-CM | POA: Diagnosis not present

## 2021-04-02 DIAGNOSIS — C7951 Secondary malignant neoplasm of bone: Secondary | ICD-10-CM | POA: Diagnosis present

## 2021-04-02 DIAGNOSIS — Z808 Family history of malignant neoplasm of other organs or systems: Secondary | ICD-10-CM | POA: Diagnosis not present

## 2021-04-02 DIAGNOSIS — Z9221 Personal history of antineoplastic chemotherapy: Secondary | ICD-10-CM | POA: Diagnosis not present

## 2021-04-02 DIAGNOSIS — Z20822 Contact with and (suspected) exposure to covid-19: Secondary | ICD-10-CM | POA: Diagnosis present

## 2021-04-02 DIAGNOSIS — Z8249 Family history of ischemic heart disease and other diseases of the circulatory system: Secondary | ICD-10-CM | POA: Diagnosis not present

## 2021-04-02 DIAGNOSIS — D62 Acute posthemorrhagic anemia: Secondary | ICD-10-CM | POA: Diagnosis not present

## 2021-04-02 DIAGNOSIS — C7949 Secondary malignant neoplasm of other parts of nervous system: Secondary | ICD-10-CM | POA: Diagnosis not present

## 2021-04-02 HISTORY — PX: LUMBAR PERCUTANEOUS PEDICLE SCREW 4 LEVEL: SHX6318

## 2021-04-02 LAB — HIV ANTIBODY (ROUTINE TESTING W REFLEX): HIV Screen 4th Generation wRfx: NONREACTIVE

## 2021-04-02 LAB — RESP PANEL BY RT-PCR (FLU A&B, COVID) ARPGX2
Influenza A by PCR: NEGATIVE
Influenza B by PCR: NEGATIVE
SARS Coronavirus 2 by RT PCR: NEGATIVE

## 2021-04-02 LAB — ABO/RH: ABO/RH(D): O POS

## 2021-04-02 LAB — LACTATE DEHYDROGENASE: LDH: 158 U/L (ref 98–192)

## 2021-04-02 LAB — TROPONIN I (HIGH SENSITIVITY): Troponin I (High Sensitivity): 4 ng/L (ref <18)

## 2021-04-02 IMAGING — RF DG THORACIC SPINE 1V
1 series · 1 of 1 positions shown · non-contrast
Comparison: None.

FLUOROSCOPY TIME:  36 seconds

CLINICAL DATA: T3-7 fusion

EXAM:
OPERATIVE THORACIC SPINE 1 VIEW(S)

[Series 1: run · 1 of 1 slices shown]
[im 1/1]
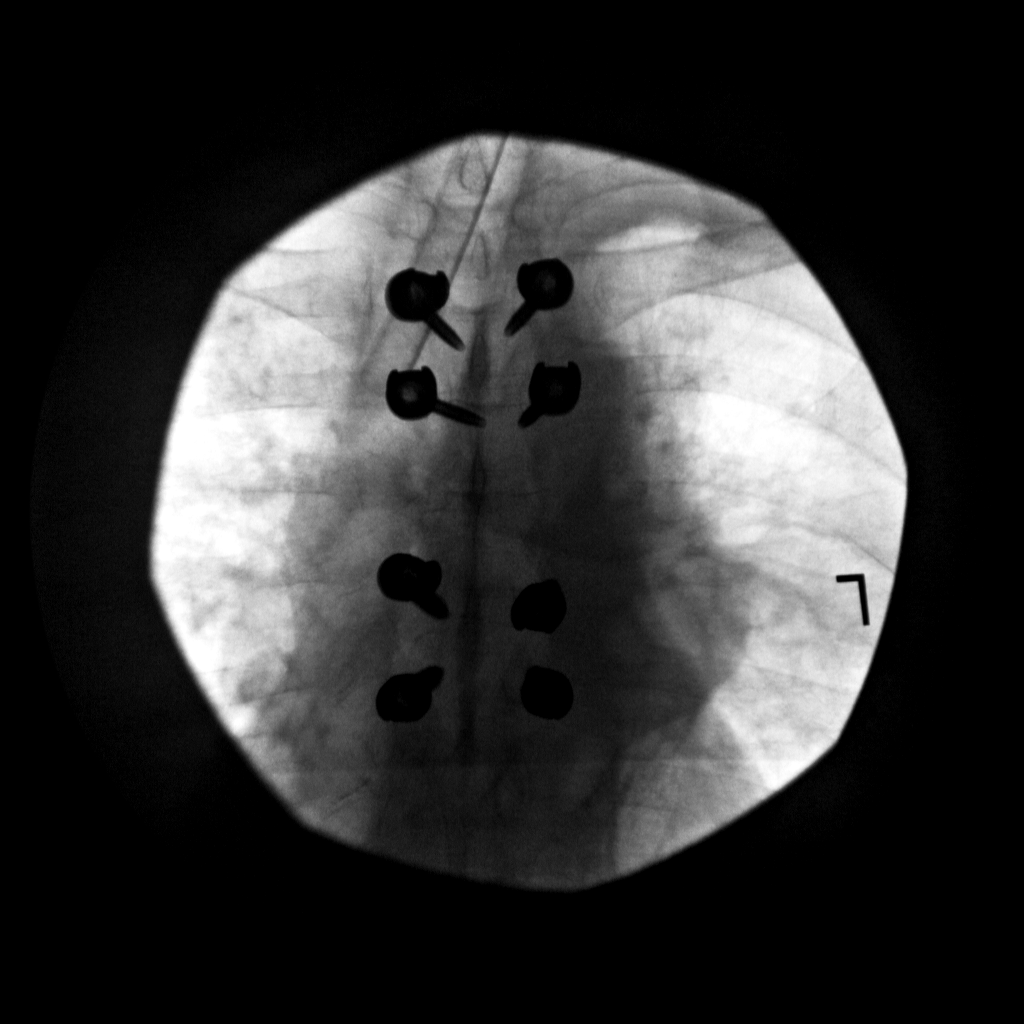

[1 of 1 positions shown; findings below may reference images not displayed]

FINDINGS: Intraoperative fluoroscopic image during T3-7 fusion with pedicle
screws.
IMPRESSION: Intraoperative fluoroscopic images, as above.

## 2021-04-02 IMAGING — CT CT ANGIO CHEST
2 of 6 series · 18 of 36 positions shown · IV contrast (OMNIPAQUE 350)
Comparison: None.

CLINICAL DATA: Weakness, T4-6 tumor, history of right tonsillar
cancer, elevated D-dimer, evaluate for PE

EXAM:
CT ANGIOGRAPHY CHEST WITH CONTRAST
TECHNIQUE: Multidetector CT imaging of the chest was performed using the
standard protocol during bolus administration of intravenous
contrast. Multiplanar CT image reconstructions and MIPs were
obtained to evaluate the vascular anatomy.
CONTRAST:  70mL OMNIPAQUE IOHEXOL 350 MG/ML SOLN

[Series 6: thins · axial · 0.62mm/px · z∈[+744,+1001]mm · 17 of 291 slices shown]
[im 17/291  lung]
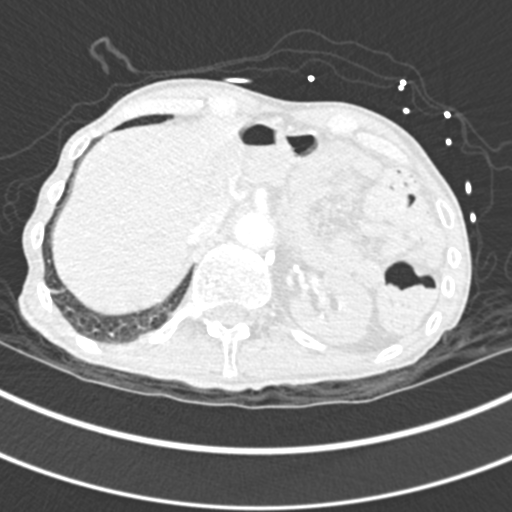
[im 33/291  mediastinal]
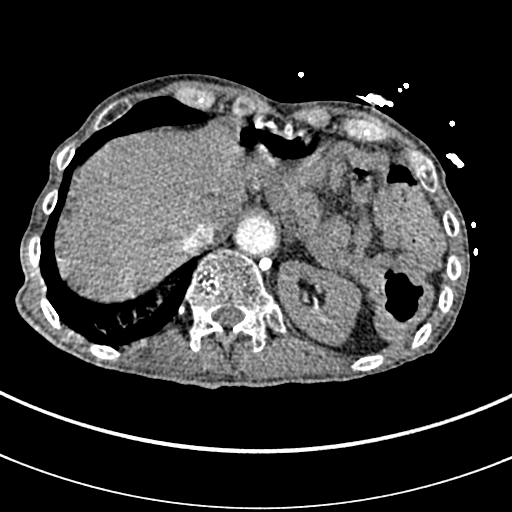
[im 49/291  lung]
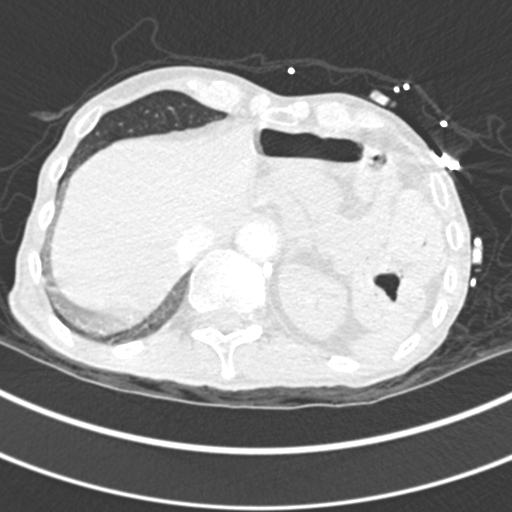
[im 65/291  mediastinal]
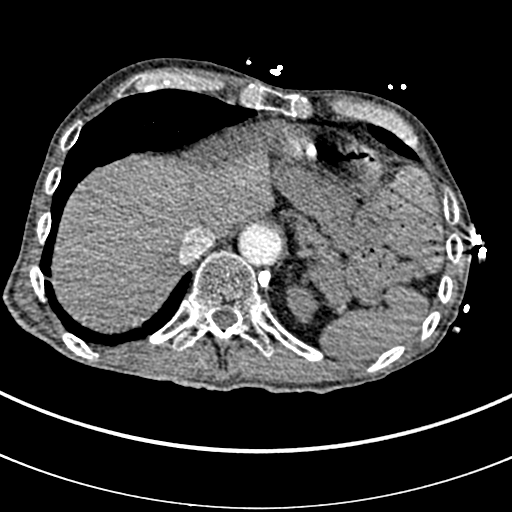
[im 81/291  lung]
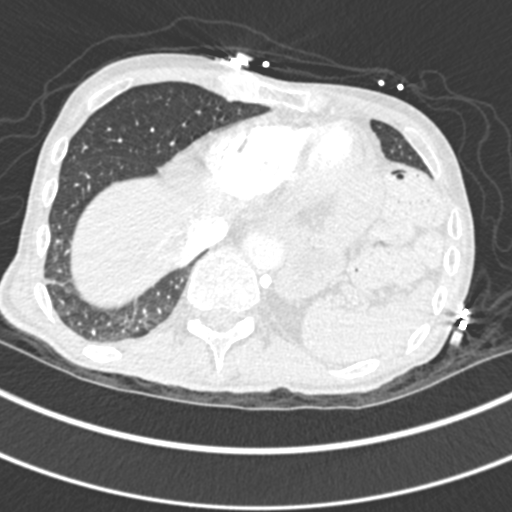
[im 97/291  mediastinal]
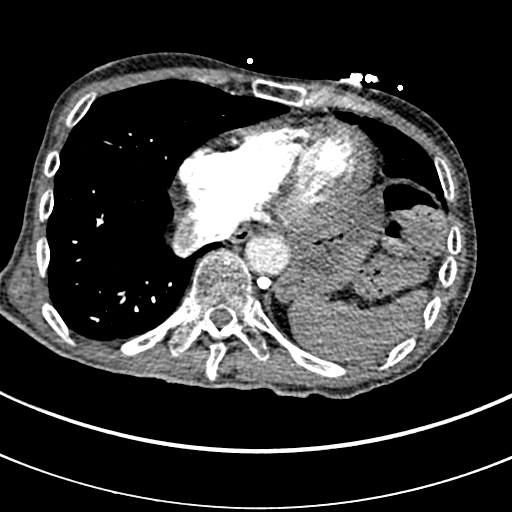
[im 113/291  lung]
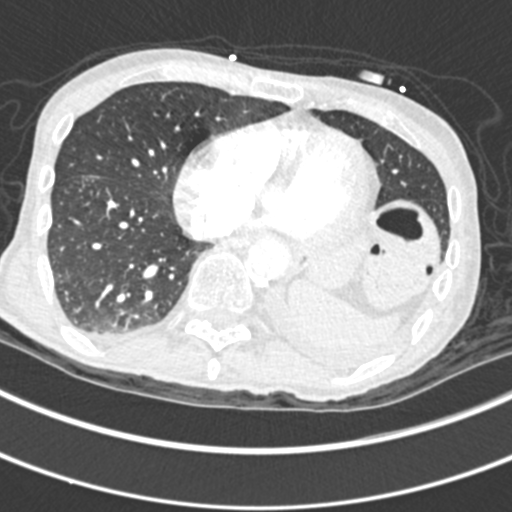
[im 129/291  mediastinal]
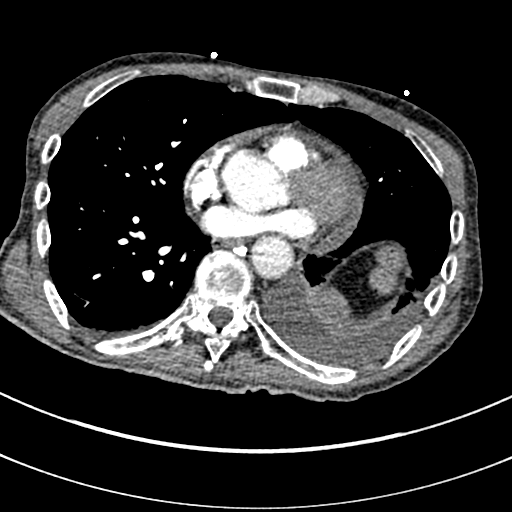
[im 146/291  lung]
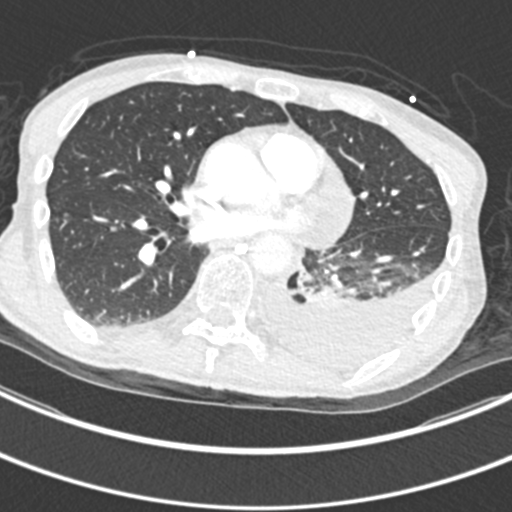
[im 162/291  mediastinal]
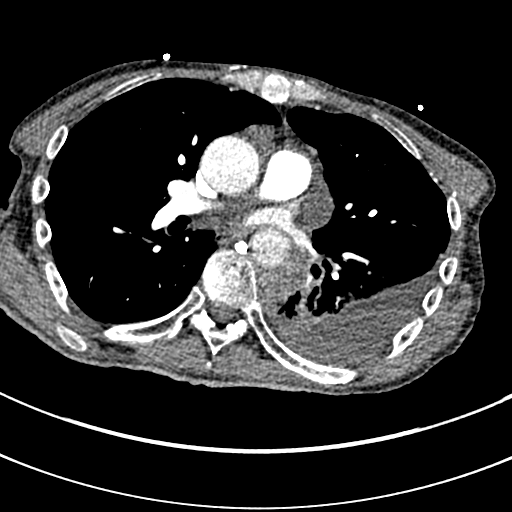
[im 178/291  lung]
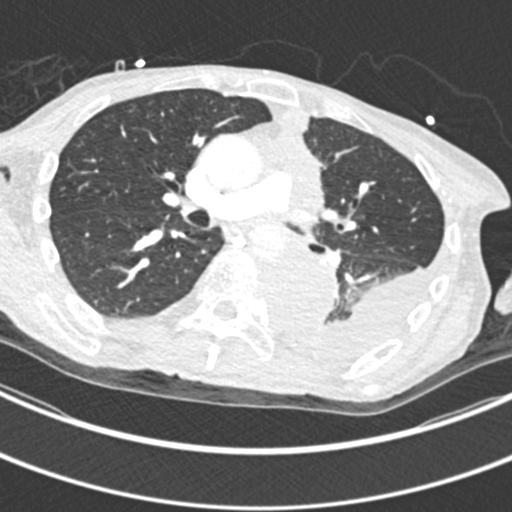
[im 194/291  mediastinal]
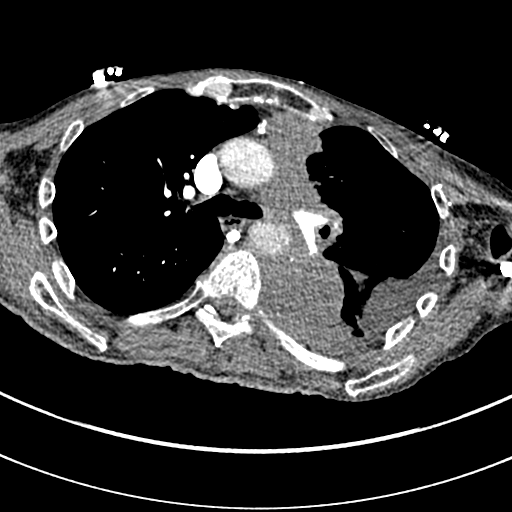
[im 210/291  lung]
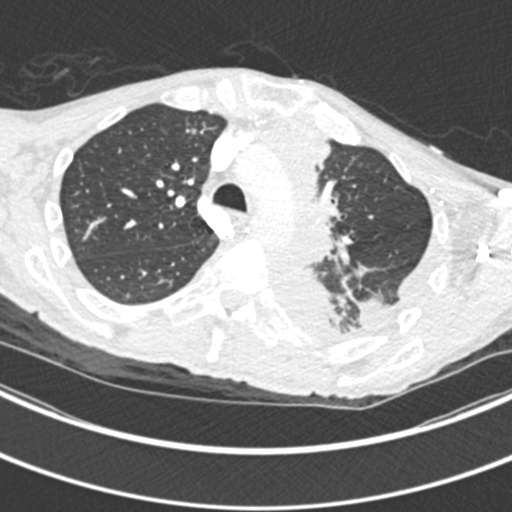
[im 226/291  mediastinal]
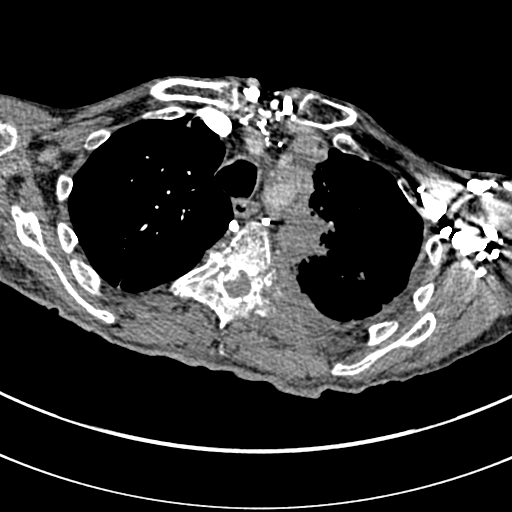
[im 242/291  lung]
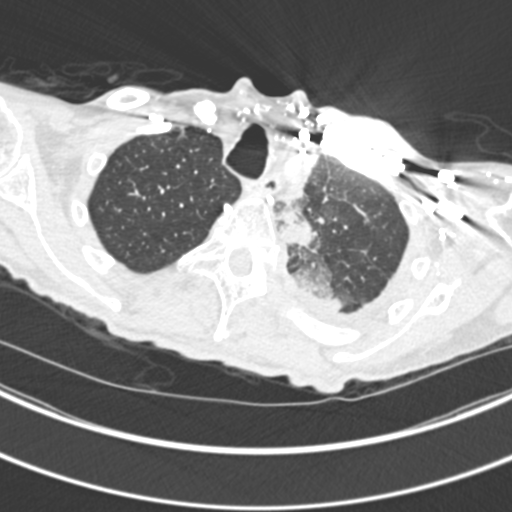
[im 258/291  mediastinal]
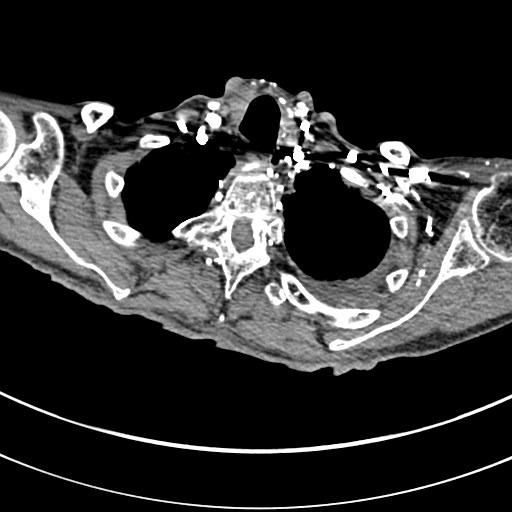
[im 274/291  lung]
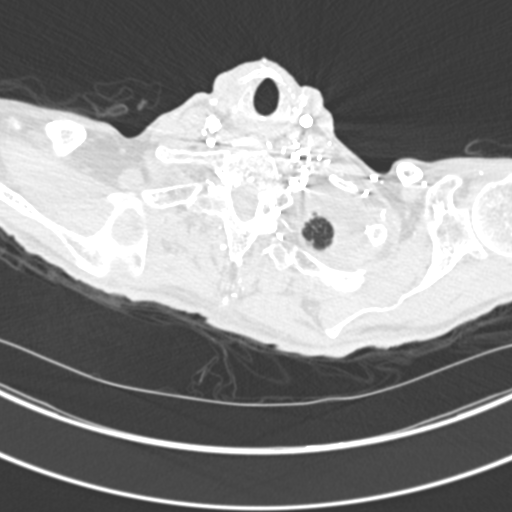

[Series 8: coronal mpr · coronal · 0.58mm/px · 1 of 100 slices shown]
[im 50/100  mediastinal]
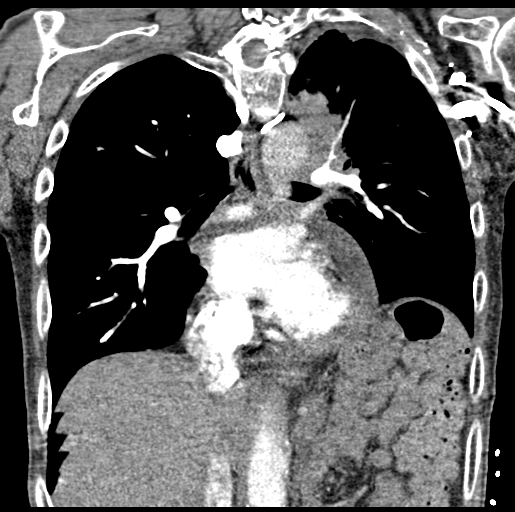

[18 of 36 positions shown; findings below may reference images not displayed]

FINDINGS: Cardiovascular: Satisfactory opacification of the bilateral
pulmonary arteries to the lobar level. Narrowing of the left main
pulmonary artery due to tumor. No evidence of pulmonary embolism.

Although not tailored for evaluation of the thoracic aorta, there is
no evidence thoracic aortic aneurysm. Mild atherosclerotic
calcifications.

The heart is normal in size.  No pericardial effusion.

Mediastinum/Nodes: Tumor along the anterior mediastinum/AP window
and extending to the medial lung. This will be described below.

Visualized thyroid is unremarkable.

Lungs/Pleura: Mass involving the medial aspect of the left upper
lobe and superior segment left lower lobe, extending to the
mediastinum, measuring 14.1 x 4.7 cm (series 6/image 93).

Moderate left pleural effusion, partially loculated, with associated
apical capping. Associated left lower lobe opacity, likely
compressive atelectasis.

Trace right pleural effusion. 9 mm nodule in the superior segment
right lower lobe (series 7/image 36).

No pneumothorax.

Upper Abdomen: Visualized upper abdomen is grossly unremarkable.

Musculoskeletal: Moderate compression fracture at T5, pathologic.
Total spine MRI has been performed and further evaluation will be
deferred to that study.

Otherwise, no focal osseous lesions.

Review of the MIP images confirms the above findings.
IMPRESSION: No evidence of pulmonary embolism.

14.1 x 4.7 cm mass involving the medial left lung and extending to
the mediastinum, as above.

Moderate left pleural effusion, partially loculated, with apical
capping. Trace right pleural effusion.

Moderate pathologic compression fracture at T5. Additional spinal
involvement will be described on dedicated total spine MR, reported
separately.

Aortic Atherosclerosis ([JV]-[JV]).

## 2021-04-02 IMAGING — US US CHEST/MEDIASTINUM
1 series · 3 of 3 positions shown · non-contrast
Comparison: CTA Chest earlier same day

CLINICAL DATA: Pleural effusion.

EXAM:
CHEST ULTRASOUND

[Series 1: us thoracentesis asp pleural s mc & wl · 3 of 3 slices shown]
[im 1/3]
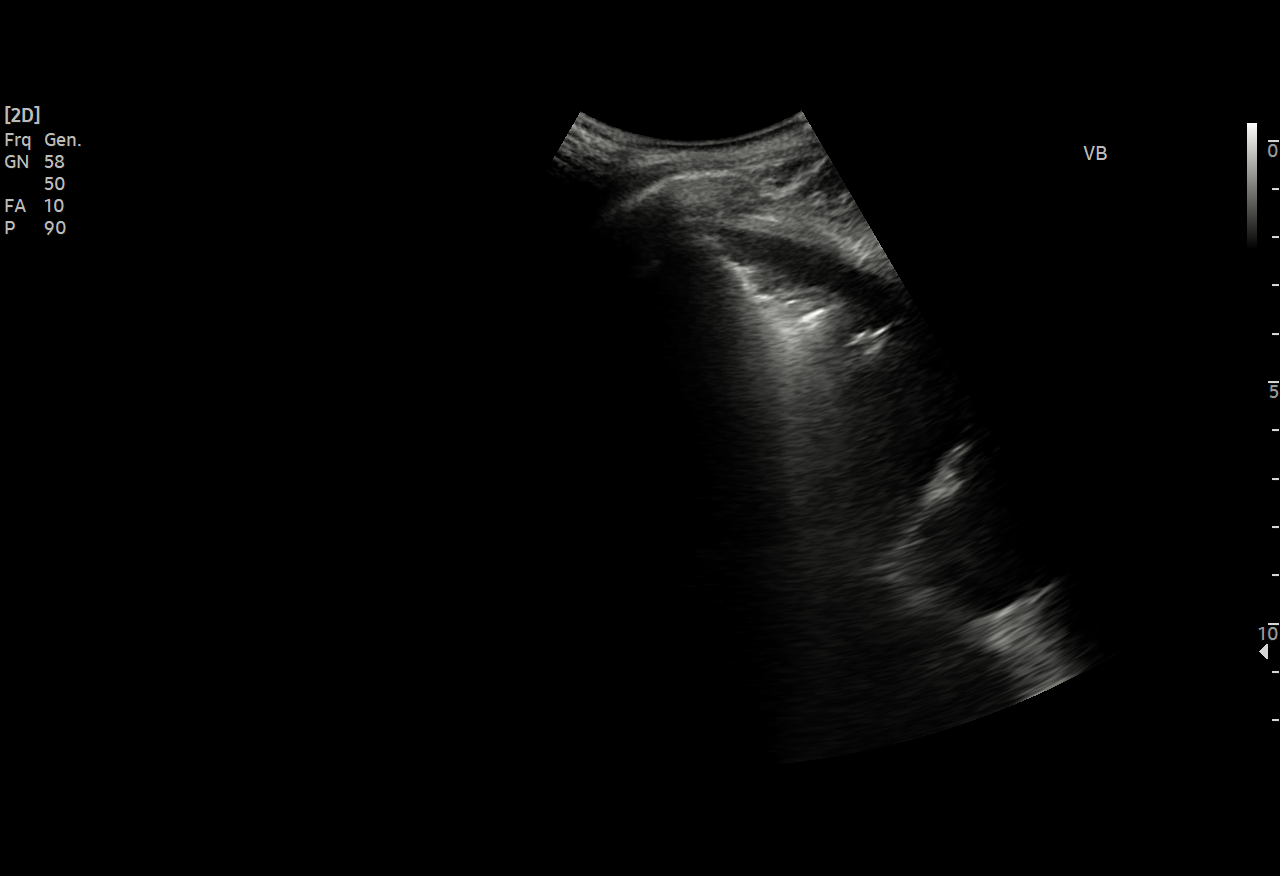
[im 2/3]
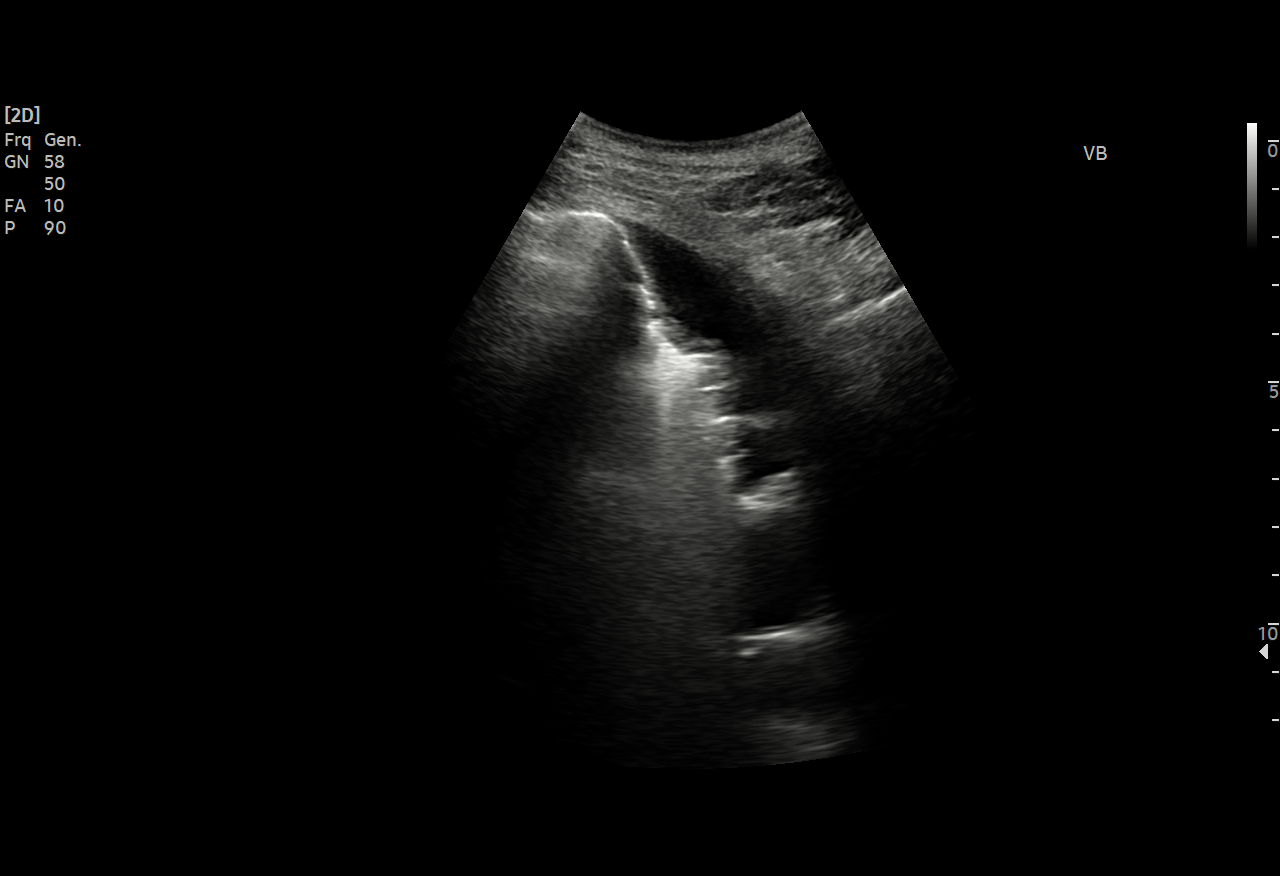
[im 3/3]
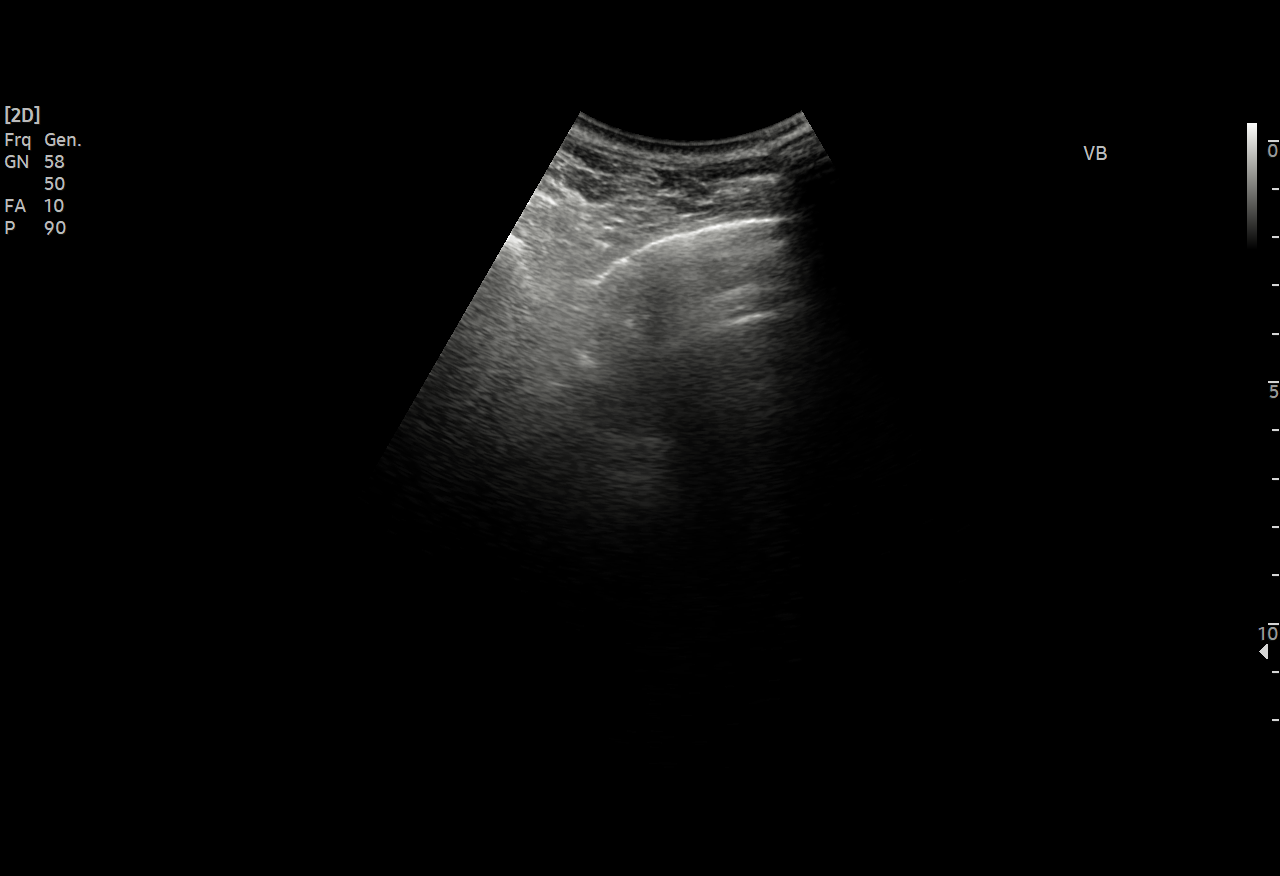

[3 of 3 positions shown; findings below may reference images not displayed]

FINDINGS: Small left pleural effusion.  No discernible right effusion.
IMPRESSION: Small left pleural effusion. Inadequate volume for safe image guided
thoracentesis.

## 2021-04-02 SURGERY — LUMBAR PERCUTANEOUS PEDICLE SCREW 4 LEVEL
Anesthesia: General | Site: Spine Thoracic

## 2021-04-02 MED ORDER — LACTATED RINGERS IV SOLN: INTRAVENOUS | Status: DC | PRN

## 2021-04-02 MED ORDER — PROPOFOL 10 MG/ML IV BOLUS
INTRAVENOUS | Status: AC
  Filled 2021-04-02: qty 20

## 2021-04-02 MED ORDER — VANCOMYCIN HCL 1000 MG IV SOLR
INTRAVENOUS | Status: AC
  Filled 2021-04-02: qty 20

## 2021-04-02 MED ORDER — CEFAZOLIN SODIUM-DEXTROSE 2-3 GM-%(50ML) IV SOLR
INTRAVENOUS | Status: DC | PRN
  Administered 2021-04-02: 2 g via INTRAVENOUS

## 2021-04-02 MED ORDER — MIDAZOLAM HCL 5 MG/5ML IJ SOLN
INTRAMUSCULAR | Status: DC | PRN
  Administered 2021-04-02: 2 mg via INTRAVENOUS

## 2021-04-02 MED ORDER — POTASSIUM CHLORIDE CRYS ER 20 MEQ PO TBCR
40.0000 meq | EXTENDED_RELEASE_TABLET | Freq: Once | ORAL | Status: DC
  Filled 2021-04-02: qty 2

## 2021-04-02 MED ORDER — LIDOCAINE HCL (CARDIAC) PF 100 MG/5ML IV SOSY
PREFILLED_SYRINGE | INTRAVENOUS | Status: DC | PRN
  Administered 2021-04-02: 80 mg via INTRATRACHEAL

## 2021-04-02 MED ORDER — THROMBIN 5000 UNITS EX SOLR
CUTANEOUS | Status: AC
  Filled 2021-04-02: qty 5000

## 2021-04-02 MED ORDER — HYDROCODONE-ACETAMINOPHEN 5-325 MG PO TABS
1.0000 | ORAL_TABLET | Freq: Four times a day (QID) | ORAL | Status: DC | PRN
Start: 2021-04-02 — End: 2021-04-02

## 2021-04-02 MED ORDER — FENTANYL CITRATE (PF) 250 MCG/5ML IJ SOLN
INTRAMUSCULAR | Status: DC | PRN
  Administered 2021-04-02 (×3): 50 ug via INTRAVENOUS
  Administered 2021-04-02: 100 ug via INTRAVENOUS
  Administered 2021-04-02 – 2021-04-03 (×2): 50 ug via INTRAVENOUS

## 2021-04-02 MED ORDER — PHENYLEPHRINE HCL-NACL 20-0.9 MG/250ML-% IV SOLN
INTRAVENOUS | Status: DC | PRN
  Administered 2021-04-02: 30 ug/min via INTRAVENOUS

## 2021-04-02 MED ORDER — BUPIVACAINE-EPINEPHRINE (PF) 0.5% -1:200000 IJ SOLN
INTRAMUSCULAR | Status: DC | PRN
  Administered 2021-04-02: 3.5 mL

## 2021-04-02 MED ORDER — SODIUM CHLORIDE 0.9 % IV BOLUS
1000.0000 mL | Freq: Once | INTRAVENOUS | Status: AC
  Administered 2021-04-02: 1000 mL via INTRAVENOUS

## 2021-04-02 MED ORDER — LIDOCAINE HCL 1 % IJ SOLN
INTRAMUSCULAR | Status: AC
  Filled 2021-04-02: qty 20

## 2021-04-02 MED ORDER — PROPOFOL 10 MG/ML IV BOLUS
INTRAVENOUS | Status: DC | PRN
  Administered 2021-04-02: 100 mg via INTRAVENOUS

## 2021-04-02 MED ORDER — ACETAMINOPHEN 325 MG PO TABS: 650.0000 mg | ORAL_TABLET | Freq: Four times a day (QID) | ORAL | Status: DC | PRN

## 2021-04-02 MED ORDER — THROMBIN 20000 UNITS EX SOLR
CUTANEOUS | Status: AC
  Filled 2021-04-02: qty 20000

## 2021-04-02 MED ORDER — 0.9 % SODIUM CHLORIDE (POUR BTL) OPTIME
TOPICAL | Status: DC | PRN
  Administered 2021-04-02: 1000 mL

## 2021-04-02 MED ORDER — DEXAMETHASONE SODIUM PHOSPHATE 4 MG/ML IJ SOLN
4.0000 mg | Freq: Four times a day (QID) | INTRAMUSCULAR | Status: DC
  Administered 2021-04-02 – 2021-04-07 (×20): 4 mg via INTRAVENOUS
  Filled 2021-04-02 (×23): qty 1

## 2021-04-02 MED ORDER — ACETAMINOPHEN 650 MG RE SUPP: 650.0000 mg | Freq: Four times a day (QID) | RECTAL | Status: DC | PRN

## 2021-04-02 MED ORDER — LIDOCAINE-EPINEPHRINE 1 %-1:100000 IJ SOLN
INTRAMUSCULAR | Status: AC
  Filled 2021-04-02: qty 1

## 2021-04-02 MED ORDER — LIDOCAINE-EPINEPHRINE 1 %-1:100000 IJ SOLN
INTRAMUSCULAR | Status: DC | PRN
  Administered 2021-04-02: 3.5 mL

## 2021-04-02 MED ORDER — ROCURONIUM BROMIDE 100 MG/10ML IV SOLN
INTRAVENOUS | Status: DC | PRN
  Administered 2021-04-02 (×2): 50 mg via INTRAVENOUS

## 2021-04-02 MED ORDER — VASOPRESSIN 20 UNIT/ML IV SOLN
INTRAVENOUS | Status: DC | PRN
  Administered 2021-04-02 (×3): 2 [IU] via INTRAVENOUS
  Administered 2021-04-02 (×3): 1 [IU] via INTRAVENOUS

## 2021-04-02 MED ORDER — THROMBIN 20000 UNITS EX SOLR: CUTANEOUS | Status: DC | PRN

## 2021-04-02 MED ORDER — BUPIVACAINE-EPINEPHRINE 0.5% -1:200000 IJ SOLN
INTRAMUSCULAR | Status: AC
  Filled 2021-04-02: qty 1

## 2021-04-02 MED ORDER — MIDAZOLAM HCL 2 MG/2ML IJ SOLN
INTRAMUSCULAR | Status: AC
  Filled 2021-04-02: qty 2

## 2021-04-02 MED ORDER — POLYETHYLENE GLYCOL 3350 17 G PO PACK: 17.0000 g | PACK | Freq: Every day | ORAL | Status: DC | PRN

## 2021-04-02 MED ORDER — DEXAMETHASONE SODIUM PHOSPHATE 10 MG/ML IJ SOLN
10.0000 mg | Freq: Once | INTRAMUSCULAR | Status: AC
  Administered 2021-04-02: 10 mg via INTRAVENOUS
  Filled 2021-04-02: qty 1

## 2021-04-02 MED ORDER — VASOPRESSIN 20 UNIT/ML IV SOLN
INTRAVENOUS | Status: AC
  Filled 2021-04-02: qty 1

## 2021-04-02 MED ORDER — HYDROCODONE-ACETAMINOPHEN 5-325 MG PO TABS
1.0000 | ORAL_TABLET | Freq: Once | ORAL | Status: DC
  Filled 2021-04-02: qty 1

## 2021-04-02 MED ORDER — LEVOTHYROXINE SODIUM 50 MCG PO TABS
50.0000 ug | ORAL_TABLET | Freq: Every day | ORAL | Status: DC
  Administered 2021-04-02 – 2021-04-09 (×7): 50 ug via ORAL
  Filled 2021-04-02 (×7): qty 1

## 2021-04-02 MED ORDER — FENTANYL CITRATE (PF) 250 MCG/5ML IJ SOLN
INTRAMUSCULAR | Status: AC
  Filled 2021-04-02: qty 5

## 2021-04-02 MED ORDER — IOHEXOL 350 MG/ML SOLN
70.0000 mL | Freq: Once | INTRAVENOUS | Status: AC | PRN
  Administered 2021-04-02: 70 mL via INTRAVENOUS

## 2021-04-02 MED ORDER — SODIUM CHLORIDE 0.9% IV SOLUTION: Freq: Once | INTRAVENOUS | Status: DC

## 2021-04-02 MED ORDER — THROMBIN 5000 UNITS EX SOLR: OROMUCOSAL | Status: DC | PRN

## 2021-04-02 SURGICAL SUPPLY — 55 items
BAG COUNTER SPONGE SURGICOUNT (BAG) ×3 IMPLANT
BAG SPNG CNTER NS LX DISP (BAG) ×2
BAG SURGICOUNT SPONGE COUNTING (BAG) ×1
BLADE SURG 11 STRL SS (BLADE) ×4 IMPLANT
BUR CARBIDE MATCH 3.0 (BURR) ×4 IMPLANT
CANISTER SUCT 3000ML PPV (MISCELLANEOUS) ×4 IMPLANT
CARTRIDGE OIL MAESTRO DRILL (MISCELLANEOUS) ×2 IMPLANT
COVER BACK TABLE 60X90IN (DRAPES) ×4 IMPLANT
COVER MAYO STAND STRL (DRAPES) ×8 IMPLANT
DRAPE C-ARM 42X72 X-RAY (DRAPES) ×4 IMPLANT
DRAPE LAPAROTOMY 100X72X124 (DRAPES) ×4 IMPLANT
DRSG OPSITE 4X5.5 SM (GAUZE/BANDAGES/DRESSINGS) ×4 IMPLANT
DRSG OPSITE POSTOP 4X12 (GAUZE/BANDAGES/DRESSINGS) ×4 IMPLANT
DURAPREP 26ML APPLICATOR (WOUND CARE) ×4 IMPLANT
ELECT BLADE INSULATED 4IN (ELECTROSURGICAL) ×4
ELECT REM PT RETURN 9FT ADLT (ELECTROSURGICAL) ×4
ELECTRODE BLADE INSULATED 4IN (ELECTROSURGICAL) ×2 IMPLANT
ELECTRODE REM PT RTRN 9FT ADLT (ELECTROSURGICAL) ×2 IMPLANT
GAUZE 4X4 16PLY ~~LOC~~+RFID DBL (SPONGE) ×4 IMPLANT
GAUZE SPONGE 4X4 12PLY STRL LF (GAUZE/BANDAGES/DRESSINGS) ×4 IMPLANT
GLOVE SRG 8 PF TXTR STRL LF DI (GLOVE) ×4 IMPLANT
GLOVE SURG LTX SZ8 (GLOVE) ×8 IMPLANT
GLOVE SURG UNDER POLY LF SZ8 (GLOVE) ×8
GOWN STRL REUS W/ TWL LRG LVL3 (GOWN DISPOSABLE) ×4 IMPLANT
GOWN STRL REUS W/ TWL XL LVL3 (GOWN DISPOSABLE) ×2 IMPLANT
GOWN STRL REUS W/TWL LRG LVL3 (GOWN DISPOSABLE) ×8
GOWN STRL REUS W/TWL XL LVL3 (GOWN DISPOSABLE) ×4
HEMOSTAT POWDER KIT SURGIFOAM (HEMOSTASIS) ×8 IMPLANT
KIT BASIN OR (CUSTOM PROCEDURE TRAY) ×4 IMPLANT
KIT POSITION SURG JACKSON T1 (MISCELLANEOUS) ×4 IMPLANT
KIT TURNOVER KIT B (KITS) ×4 IMPLANT
MARKER SKIN DUAL TIP RULER LAB (MISCELLANEOUS) ×4 IMPLANT
NEEDLE HYPO 25X1 1.5 SAFETY (NEEDLE) ×4 IMPLANT
NEEDLE SPNL 22GX3.5 QUINCKE BK (NEEDLE) ×4 IMPLANT
NS IRRIG 1000ML POUR BTL (IV SOLUTION) ×4 IMPLANT
OIL CARTRIDGE MAESTRO DRILL (MISCELLANEOUS) ×4
PACK LAMINECTOMY NEURO (CUSTOM PROCEDURE TRAY) ×4 IMPLANT
PUTTY BONE 100 VESUVIUS 2.5CC (Putty) ×8 IMPLANT
ROD CURVED 125MM (Rod) ×8 IMPLANT
SCREW CANN PA EVEREST 4.5X50 (Screw) ×16 IMPLANT
SCREW PA FENS EVEREST 6.5X40 (Screw) ×16 IMPLANT
SET SCREW (Screw) ×32 IMPLANT
SET SCREW VRST (Screw) ×16 IMPLANT
SPONGE SURGIFOAM ABS GEL 100 (HEMOSTASIS) ×4 IMPLANT
SPONGE T-LAP 4X18 ~~LOC~~+RFID (SPONGE) ×12 IMPLANT
STAPLER SKIN PROX WIDE 3.9 (STAPLE) ×4 IMPLANT
SUT PROLENE 5 0 C1 (SUTURE) ×4 IMPLANT
SUT VIC AB 0 CT1 18XCR BRD8 (SUTURE) ×4 IMPLANT
SUT VIC AB 0 CT1 8-18 (SUTURE) ×8
SUT VIC AB 2-0 CP2 18 (SUTURE) ×12 IMPLANT
TOWEL GREEN STERILE (TOWEL DISPOSABLE) ×4 IMPLANT
TOWEL GREEN STERILE FF (TOWEL DISPOSABLE) ×4 IMPLANT
TRAY FOLEY MTR SLVR 16FR STAT (SET/KITS/TRAYS/PACK) ×4 IMPLANT
UNDERPAD 30X36 HEAVY ABSORB (UNDERPADS AND DIAPERS) ×4 IMPLANT
WATER STERILE IRR 1000ML POUR (IV SOLUTION) ×4 IMPLANT

## 2021-04-02 NOTE — Anesthesia Procedure Notes (Signed)
Procedure Name: Intubation Date/Time: 04/02/2021 10:10 PM Performed by: Clovis Cao, CRNA Pre-anesthesia Checklist: Patient identified, Emergency Drugs available, Suction available and Patient being monitored Patient Re-evaluated:Patient Re-evaluated prior to induction Oxygen Delivery Method: Circle system utilized Preoxygenation: Pre-oxygenation with 100% oxygen Induction Type: IV induction Ventilation: Mask ventilation without difficulty Laryngoscope Size: Miller and 2 Grade View: Grade I Tube type: Oral Tube size: 8.0 mm Number of attempts: 1 Airway Equipment and Method: Stylet Placement Confirmation: ETT inserted through vocal cords under direct vision, positive ETCO2 and breath sounds checked- equal and bilateral Secured at: 22 cm Tube secured with: Tape Dental Injury: Teeth and Oropharynx as per pre-operative assessment

## 2021-04-02 NOTE — Anesthesia Procedure Notes (Signed)
Arterial Line Insertion Start/End11/03/2021 10:05 PM, 04/02/2021 10:15 PM Performed by: Catalina Gravel, MD, anesthesiologist  Patient location: OR. Preanesthetic checklist: patient identified, IV checked, site marked, risks and benefits discussed, surgical consent, monitors and equipment checked, pre-op evaluation, timeout performed and anesthesia consent Lidocaine 1% used for infiltration Left, radial was placed Catheter size: 20 G Hand hygiene performed  and maximum sterile barriers used   Attempts: 2 Procedure performed without using ultrasound guided technique. Following insertion, dressing applied and Biopatch. Post procedure assessment: normal and unchanged  Patient tolerated the procedure well with no immediate complications.

## 2021-04-02 NOTE — Plan of Care (Signed)
  Problem: Education: Goal: Knowledge of General Education information will improve Description: Including pain rating scale, medication(s)/side effects and non-pharmacologic comfort measures 04/02/2021 1553 by Vesta Mixer, RN Outcome: Progressing 04/02/2021 1552 by Vesta Mixer, RN Outcome: Progressing   Problem: Health Behavior/Discharge Planning: Goal: Ability to manage health-related needs will improve 04/02/2021 1553 by Vesta Mixer, RN Outcome: Progressing 04/02/2021 1552 by Vesta Mixer, RN Outcome: Progressing   Problem: Clinical Measurements: Goal: Ability to maintain clinical measurements within normal limits will improve 04/02/2021 1553 by Vesta Mixer, RN Outcome: Progressing 04/02/2021 1552 by Vesta Mixer, RN Outcome: Progressing Goal: Will remain free from infection 04/02/2021 1553 by Vesta Mixer, RN Outcome: Progressing 04/02/2021 1552 by Vesta Mixer, RN Outcome: Progressing Goal: Diagnostic test results will improve 04/02/2021 1553 by Vesta Mixer, RN Outcome: Progressing 04/02/2021 1552 by Vesta Mixer, RN Outcome: Progressing Goal: Respiratory complications will improve 04/02/2021 1553 by Vesta Mixer, RN Outcome: Progressing 04/02/2021 1552 by Vesta Mixer, RN Outcome: Progressing Goal: Cardiovascular complication will be avoided 04/02/2021 1553 by Vesta Mixer, RN Outcome: Progressing 04/02/2021 1552 by Vesta Mixer, RN Outcome: Progressing   Problem: Activity: Goal: Risk for activity intolerance will decrease 04/02/2021 1553 by Vesta Mixer, RN Outcome: Progressing 04/02/2021 1552 by Vesta Mixer, RN Outcome: Progressing   Problem: Nutrition: Goal: Adequate nutrition will be maintained 04/02/2021 1553 by Vesta Mixer, RN Outcome: Progressing 04/02/2021 1552 by Vesta Mixer, RN Outcome: Progressing   Problem: Coping: Goal: Level of anxiety will decrease 04/02/2021  1553 by Vesta Mixer, RN Outcome: Progressing 04/02/2021 1552 by Vesta Mixer, RN Outcome: Progressing   Problem: Elimination: Goal: Will not experience complications related to bowel motility 04/02/2021 1553 by Vesta Mixer, RN Outcome: Progressing 04/02/2021 1552 by Vesta Mixer, RN Outcome: Progressing Goal: Will not experience complications related to urinary retention 04/02/2021 1553 by Vesta Mixer, RN Outcome: Progressing 04/02/2021 1552 by Vesta Mixer, RN Outcome: Progressing   Problem: Pain Managment: Goal: General experience of comfort will improve 04/02/2021 1553 by Vesta Mixer, RN Outcome: Progressing 04/02/2021 1552 by Vesta Mixer, RN Outcome: Progressing   Problem: Safety: Goal: Ability to remain free from injury will improve 04/02/2021 1553 by Vesta Mixer, RN Outcome: Progressing 04/02/2021 1552 by Vesta Mixer, RN Outcome: Progressing   Problem: Skin Integrity: Goal: Risk for impaired skin integrity will decrease 04/02/2021 1553 by Vesta Mixer, RN Outcome: Progressing 04/02/2021 1552 by Vesta Mixer, RN Outcome: Progressing

## 2021-04-02 NOTE — Anesthesia Preprocedure Evaluation (Addendum)
Anesthesia Evaluation  Patient identified by MRN, date of birth, ID band Patient awake  General Assessment Comment:Right tonsillar cancer  Reviewed: Allergy & Precautions, NPO status , Patient's Chart, lab work & pertinent test results  Airway Mallampati: II  TM Distance: >3 FB Neck ROM: Full    Dental  (+) Teeth Intact, Dental Advisory Given   Pulmonary former smoker,  Lung cancer   Pulmonary exam normal breath sounds clear to auscultation       Cardiovascular negative cardio ROS Normal cardiovascular exam Rhythm:Regular Rate:Normal     Neuro/Psych Thoracic tumor    GI/Hepatic negative GI ROS, Neg liver ROS,   Endo/Other  Hypothyroidism   Renal/GU negative Renal ROS     Musculoskeletal negative musculoskeletal ROS (+)   Abdominal   Peds  Hematology negative hematology ROS (+)   Anesthesia Other Findings Day of surgery medications reviewed with the patient.  Reproductive/Obstetrics                           Anesthesia Physical Anesthesia Plan  ASA: 4 and emergent  Anesthesia Plan: General   Post-op Pain Management:    Induction: Intravenous  PONV Risk Score and Plan: 2 and Dexamethasone and Ondansetron  Airway Management Planned: Oral ETT  Additional Equipment:   Intra-op Plan:   Post-operative Plan: Possible Post-op intubation/ventilation  Informed Consent: I have reviewed the patients History and Physical, chart, labs and discussed the procedure including the risks, benefits and alternatives for the proposed anesthesia with the patient or authorized representative who has indicated his/her understanding and acceptance.   Patient has DNR.  Discussed DNR with patient and Continue DNR.   Dental advisory given  Plan Discussed with: CRNA  Anesthesia Plan Comments:        Anesthesia Quick Evaluation

## 2021-04-02 NOTE — Progress Notes (Signed)
Bilateral lower extremity venous duplex has been completed. Preliminary results can be found in CV Proc through chart review.   04/02/21 8:56 AM Carlos Levering RVT

## 2021-04-02 NOTE — Consult Note (Addendum)
   Providing Compassionate, Quality Care - Together  Neurosurgery Consult  Referring physician: Dr. 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 He has been treated at [facility] for CA and had consultations at multiple other facilities. States he has upper back, L>R pain as well that is significant and worsening with movements. Denies groin numbness but does complain of trunk and BLE numbness from the nipples down.   Medications: I have reviewed the patient's current medications. Allergies: No Known Allergies  History reviewed. No pertinent family history. Social History:  has no history on file for tobacco use, alcohol use, and drug use.  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  Please do not hesitate to call with questions or concerns.   Elwin Sleight, [facility] Cell: [phone]

## 2021-04-02 NOTE — Consult Note (Signed)
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  He has received care at multiple institutions over the past several years including [facility], [place], [facility], and most recently [facility].  He was diagnosed with stage IV (T2N2bM0), HPV positive, disease and was treated with tentative chemotherapy/radiation between April and May 2016.  He completed concurrent cisplatin and radiation.  He reports entering remission after initial treatment.  In April 2019 a chest CT revealed a new left upper lobe cavitary lesion and an infiltrative left hilar mass and mediastinal adenopathy.  A bronchoscopy 09/28/2017 and biopsy of a station 7 node revealed metastatic p16 positive squamous cell carcinoma.  He was treated from June 2019 through August 2020 with systemic therapy.  He initially completed treatment with carboplatin/docetaxel/pembrolizumab for 5 cycles followed by maintenance pembrolizumab.  He developed progressive disease beginning in February 2020 and subsequently in May 2020 in August 2020 while on pembrolizumab which was continued due to slow mild disease progression.  He has not received treatment since August 2020.  He was seen at [facility] in June of this year.  A restaging PET scan on 10/30/2020 revealed a hypermetabolic posterior mediastinal mass involving T5 and T6 vertebral bodies and the left posterior fifth and sixth ribs.  Hypermetabolic soft tissues was noted in the prevascular mediastinum contiguous with the dominant posterior mediastinal mass.  Hypermetabolic spiculated nodules were noted in the left upper lobe and there was a small left pleural effusion.  He did not follow-up at [facility].  Bradley Oliver reports having chest discomfort for several months.  The pain has become more intense over the past 3 weeks.  He  developed leg weakness beginning 03/24/2021.  He also developed "numbness "beginning at the level of the breast.  He reports constipation.  No difficulty with bladder control. He presented to the emergency room on 04/01/2021 after a fall.  Imaging the chest and spine confirmed a large destructive and infiltrative mass centered at the left paraspinous region with involvement of T3-4-T7 with epidural extension and severe spinal stenosis, cord compression, and cord signal changes.  Dr. Reatha Armour was consulted to consider surgical decompression.  Dr. Tammi Klippel recommends considering surgery as opposed to urgent radiation.  Bradley Oliver reports marked improvement in pain following the first dose of Decadron.  He continues to have leg weakness and numbness from approximately the breasts down.  His brother and sister-in-law are at the bedside.    Past Medical History:  Diagnosis Date   Blood in stool    Cancer ([place]) 08/05/14   right tonsil, P16 positive  :   Past Surgical History:  Procedure Laterality Date   EYE SURGERY     KNEE SURGERY     tonsil biopsy Right 08/05/14   invasive squamous cell carcinoma  :   Current Facility-Administered Medications:    acetaminophen (TYLENOL) tablet 650 mg, 650 mg, Oral, Q6H PRN **OR** acetaminophen (TYLENOL) suppository 650 mg, 650 mg, Rectal, Q6H PRN, Shela Leff, MD   dexamethasone (DECADRON) injection 4 mg, 4 mg, Intravenous, Q6H, Shela Leff, MD, 4 mg at 04/02/21 1818   levothyroxine (SYNTHROID) tablet 50 mcg, 50 mcg, Oral, Q0600, Shela Leff, MD, 50 mcg at 04/02/21 0504   polyethylene glycol (MIRALAX / GLYCOLAX) packet 17 g, 17 g, Oral, Daily PRN, Shela Leff, MD   potassium chloride SA (KLOR-CON)  CR tablet 40 mEq, 40 mEq, Oral, Once, Shela Leff, MD:   dexamethasone (DECADRON) injection  4 mg Intravenous Q6H   levothyroxine  50 mcg Oral Q0600   potassium chloride  40 mEq Oral Once  :  No Known Allergies:  FH:  Brother with history of skin cancers, sister with a history of breast cancer  SOCIAL HISTORY: He lives in [place].  He is disabled.  He previously worked as a Management consultant.  He does not use cigarettes or alcohol.  Review of Systems:  Positives include: Chest pain for 3 months-worse for the past 3 weeks, leg weakness beginning 1 03/24/2021 numbness in the torso and legs beginning 03/24/2021  A complete ROS was otherwise negative.   Physical Exam:  Blood pressure 114/79, pulse 91, temperature 98.1 F (36.7 C), temperature source Oral, resp. rate 15, height _0  (1.778 m), weight 110 lb (49.9 kg), SpO2 100 %.  HEENT: Oral cavity without visible mass, neck without mass Lungs: Inspiratory rales at the left lower posterior chest, no respiratory distress Cardiac: Regular rate and rhythm Abdomen: No hepatosplenomegaly  Vascular: No leg edema Lymph nodes: No cervical, supraclavicular, axillary, or inguinal nodes Neurologic: Alert and oriented, the motor exam appears intact in the upper extremities bilaterally.  Good strength with dorsi flexion and plantar flexion at the feet.  3/5 strength with leg raise bilaterally Skin: Diminished sensation to light touch beginning at the costal margin and extending to the legs Musculoskeletal: No spine tenderness  LABS:   Recent Labs    04/01/21 1912  WBC 6.6  HGB 14.0  HCT 40.9  PLT 274     Recent Labs    04/01/21 1912  NA 137  K 3.4*  CL 101  CO2 27  GLUCOSE 97  BUN 20  CREATININE 0.72  CALCIUM 9.3      RADIOLOGY:  CT Angio Chest PE W and/or Wo Contrast  Result Date: 04/02/2021 CLINICAL DATA:  Weakness, T4-6 tumor, history of right tonsillar cancer, elevated D-dimer, evaluate for PE EXAM: CT ANGIOGRAPHY CHEST WITH CONTRAST TECHNIQUE: Multidetector CT imaging of the chest was performed using the standard protocol during bolus administration of intravenous contrast. Multiplanar CT image reconstructions and MIPs were obtained to  evaluate the vascular anatomy. CONTRAST:  12m OMNIPAQUE IOHEXOL 350 MG/ML SOLN COMPARISON:  None. FINDINGS: Cardiovascular: Satisfactory opacification of the bilateral pulmonary arteries to the lobar level. Narrowing of the left main pulmonary artery due to tumor. No evidence of pulmonary embolism. Although not tailored for evaluation of the thoracic aorta, there is no evidence thoracic aortic aneurysm. Mild atherosclerotic calcifications. The heart is normal in size.  No pericardial effusion. Mediastinum/Nodes: Tumor along the anterior mediastinum/AP window and extending to the medial lung. This will be described below. Visualized thyroid is unremarkable. Lungs/Pleura: Mass involving the medial aspect of the left upper lobe and superior segment left lower lobe, extending to the mediastinum, measuring 14.1 x 4.7 cm (series 6/image 93). Moderate left pleural effusion, partially loculated, with associated apical capping. Associated left lower lobe opacity, likely compressive atelectasis. Trace right pleural effusion. 9 mm nodule in the superior segment right lower lobe (series 7/image 36). No pneumothorax. Upper Abdomen: Visualized upper abdomen is grossly unremarkable. Musculoskeletal: Moderate compression fracture at T5, pathologic. Total spine MRI has been performed and further evaluation will be deferred to that study. Otherwise, no focal osseous lesions. Review of the MIP images confirms the above findings. IMPRESSION: No evidence of pulmonary embolism. 14.1 x 4.7 cm mass involving the  medial left lung and extending to the mediastinum, as above. Moderate left pleural effusion, partially loculated, with apical capping. Trace right pleural effusion. Moderate pathologic compression fracture at T5. Additional spinal involvement will be described on dedicated total spine MR, reported separately. Aortic Atherosclerosis (ICD10-I70.0). Electronically Signed   By: Julian Hy M.D.   On: 04/02/2021 01:31   MR  Cervical Spine W or Wo Contrast  Result Date: 04/02/2021 CLINICAL DATA:  Initial evaluation for metastatic disease evaluation. EXAM: MRI CERVICAL SPINE WITHOUT AND WITH CONTRAST TECHNIQUE: Multiplanar and multiecho pulse sequences of the cervical spine, to include the craniocervical junction and cervicothoracic junction, were obtained without and with intravenous contrast. CONTRAST:  5.91m GADAVIST GADOBUTROL 1 MMOL/ML IV SOLN COMPARISON:  None. FINDINGS: Alignment: Physiologic with preservation of the normal cervical lordosis. No listhesis. Vertebrae: Vertebral body height maintained without acute or chronic fracture. Bone marrow signal intensity within normal limits. No discrete osseous lesions or evidence for metastatic disease. No abnormal edema or enhancement. Cord: Normal signal morphology. Posterior Fossa, vertebral arteries, paraspinal tissues: Visualized brain and posterior fossa within normal limits. Craniocervical junction normal. Paraspinous soft tissues within normal limits. Normal flow voids seen within the vertebral arteries bilaterally. Disc levels: C2-C3: Right paracentral disc protrusion indents the right ventral thecal sac. No significant spinal stenosis or cord impingement. Foramina remain patent. C3-C4: Right paracentral disc protrusion indents the right ventral thecal sac. No significant spinal stenosis or cord impingement. Foramina remain patent. C4-C5: Mild degenerative intervertebral disc space narrowing. Superimposed right paracentral disc protrusion indents the ventral thecal sac, contacting and minimally flattening the ventral cord. Thecal sac remains widely patent without significant spinal stenosis. Bilateral uncovertebral spurring with resultant mild right C5 foraminal stenosis. Left neural foramen remains patent. C5-C6: Mild degenerative intervertebral disc space narrowing. Right eccentric disc bulge with associated right greater than left uncovertebral spurring. Broad posterior  disc osteophyte mildly flattens the ventral thecal sac without significant spinal stenosis. Moderate right C6 foraminal narrowing. Left neural foramina remains patent. C6-C7: Mild degenerative intervertebral disc space narrowing. Right eccentric disc osteophyte complex mildly flattens the ventral thecal sac without significant spinal stenosis. Mild to moderate right C7 foraminal narrowing. Left neural foramen remains patent. C7-T1: Minimal disc bulge. Mild facet hypertrophy. No spinal stenosis. Foramina remain patent. IMPRESSION: 1. No evidence for metastatic disease within the cervical spine. 2. Small right paracentral disc protrusions at C2-3 through C4-5 without significant spinal stenosis. 3. Right eccentric disc osteophyte complexes at C5-6 and C6-7 with resultant mild to moderate right C6 and C7 foraminal stenosis as above. Electronically Signed   By: BJeannine BogaM.D.   On: 04/02/2021 01:05   MR THORACIC SPINE W WO CONTRAST  Result Date: 04/02/2021 CLINICAL DATA:  Initial metastatic evaluation. EXAM: MRI THORACIC WITHOUT AND WITH CONTRAST TECHNIQUE: Multiplanar and multiecho pulse sequences of the thoracic spine were obtained without and with intravenous contrast. CONTRAST:  5.577mGADAVIST GADOBUTROL 1 MMOL/ML IV SOLN COMPARISON:  None available. FINDINGS: Alignment: Dextroscoliosis. Focal kyphotic angulation of the thoracic spine at the level of T5. No listhesis. Vertebrae: Large contiguous infiltrative and destructive metastatic deposit seen involving the upper-midthoracic spine. Lesion involves the T4 through T7 vertebral bodies, and is centered at the left paraspinous region. Although measurements are somewhat difficult, lesion measures approximately 7.7 x 8.4 x 10.9 cm in greatest dimensions (AP by transverse by craniocaudad). Lesion involves the left hilar/perihilar region, tracking posteriorly along the posteromedial left lung, involving the adjacent left mediastinum and partially encasing  the aorta. Direct extension  of this mass into the adjacent posterior ribs and T4 through T7 vertebral bodies, with involvement of their posterior elements at several levels. There is an associated pathologic fracture of T5 with approximately 60% height loss. Epidural extension of tumor to involve the spinal canal, extending from T3 through T7. This is most pronounced at the level of T5 where there is circumferential epidural involvement. Associated severe spinal stenosis at the level of T5 with cord compression and associated cord signal changes (series 17, image 8). Thecal sac measures 4 mm in AP diameter at its most narrow point. Tumor also extends to involve the left neural foramina at T4-5 through T6-7, with probable early extension into the left T3-4 and T7-8 foramina. Involvement of the right T4-5 and T6-7 neural foramina noted as well. There is an additional 1 cm T1 hypointense enhancing lesion within the T11 vertebral body, indeterminate, but could reflect an additional metastatic lesion (series 19, image 7). Otherwise, no other metastatic disease seen within the thoracic spine. Multiple scattered benign hemangiomata noted. No other acute or chronic fracture. Cord: Epidural tumor with associated severe spinal stenosis and cord signal changes at T4-5 through T6-7 as above, most pronounced at the T5 level. Otherwise, signal intensity within the thoracic cord is within normal limits. No other epidural involvement. Paraspinal and other soft tissues: Metastatic lesion involving the left paraspinous and posteromedial left lung at T4 through T7 as above. Associated atelectasis with irregular and partially loculated left pleural effusion partially visualized. Direct extension of tumor to involve the left posterior soft tissues of the left mid back noted as well. Disc levels: No significant underlying disc pathology for age. No other significant stenosis or impingement. IMPRESSION: 1. Large destructive and  infiltrative metastatic deposit centered at the left paraspinous region, with involvement of the T4 through T7 vertebral bodies as above. Associated epidural extension with severe spinal stenosis, cord compression, and cord signal changes at the levels of T4-5 through T6-7. 2. Metastatic lesion extends to involve the adjacent posteromedial aspect of the left lung as well as the left adjacent mediastinum and left hilar/perihilar region, also seen on corresponding CTA of the chest. 3. Associated pathologic fracture of T5 with up to 60% height loss. 4. 1 cm enhancing lesion within the T11 vertebral body, indeterminate, but could reflect an additional metastatic lesion. Attention at follow-up recommended. Critical Value/emergent results were called by telephone at the time of interpretation on 04/02/2021 at 1:32 am to provider Dr. Florina Ou , who verbally acknowledged these results. Electronically Signed   By: Jeannine Boga M.D.   On: 04/02/2021 01:41   MR Lumbar Spine W Wo Contrast  Result Date: 04/02/2021 CLINICAL DATA:  Initial evaluation for metastatic evaluation. EXAM: MRI LUMBAR SPINE WITHOUT AND WITH CONTRAST TECHNIQUE: Multiplanar and multiecho pulse sequences of the lumbar spine were obtained without and with intravenous contrast. CONTRAST:  5.70m GADAVIST GADOBUTROL 1 MMOL/ML IV SOLN COMPARISON:  None available. FINDINGS: Segmentation: Standard. Lowest well-formed disc space labeled the L5-S1 level. Alignment: Physiologic with preservation of the normal lumbar lordosis. No listhesis. Vertebrae: Vertebral body height maintained without acute or chronic fracture. Prominent benign hemangioma noted within the T12 vertebral body. There is a 8 mm T1 hypointense enhancing lesion within the inferior aspect of the L5 vertebral body, nonspecific, but could reflect a small metastatic lesion (series 23, image 6). No other evidence for metastatic disease seen within the lumbar spine. Conus medullaris and cauda  equina: Conus extends to the L1 level. Conus and cauda equina appear  normal. Paraspinal and other soft tissues: Mild diffuse edema within the lower posterior paraspinous musculature, nonspecific, but could reflect muscular injury and/or strain. Soft tissues demonstrate no other acute finding. Prominent distension of the partially visualized urinary bladder. Visualized visceral structures otherwise unremarkable. Disc levels: L1-2:  Negative interspace.  Mild facet hypertrophy.  No stenosis. L2-3:  Negative interspace.  Mild facet hypertrophy.  No stenosis. L3-4:  Negative interspace.  Mild facet hypertrophy.  No stenosis. L4-5: Disc desiccation with minimal disc bulge. Superimposed tiny central disc protrusion with annular fissure. Mild facet hypertrophy. Mild narrowing of the lateral recesses. Central canal remains patent. No foraminal stenosis. L5-S1: Degenerative intervertebral disc space narrowing with disc desiccation and mild diffuse disc bulge. Mild reactive endplate spurring. Mild facet hypertrophy. No significant spinal stenosis. Foramina remain patent. IMPRESSION: 1. 8 mm enhancing lesion within the inferior aspect of the L5 vertebral body, nonspecific, but could reflect a small metastatic lesion. Attention at follow-up recommended. 2. No other evidence for metastatic disease within the lumbar spine. 3. Mild diffuse edema within the lower posterior paraspinous musculature, nonspecific, but could reflect muscular injury and/or strain. 4. Prominent distension of the visualized urinary bladder. Query urinary retention. 5. Mild for age spondylosis without significant stenosis. Electronically Signed   By: Jeannine Boga M.D.   On: 04/02/2021 01:51   [place] CHEST (PLEURAL EFFUSION)  Result Date: 04/02/2021 CLINICAL DATA:  Pleural effusion. EXAM: CHEST ULTRASOUND COMPARISON:  CTA Chest earlier same day FINDINGS: Small left pleural effusion.  No discernible right effusion. IMPRESSION: Small left pleural  effusion. Inadequate volume for safe image guided thoracentesis. Electronically Signed   By: Misty Stanley M.D.   On: 04/02/2021 12:23   DG Chest Portable 1 View  Result Date: 04/01/2021 CLINICAL DATA:  Chest tightness, tumor at T4-6 EXAM: PORTABLE CHEST 1 VIEW COMPARISON:  11/09/2017 FINDINGS: Left perihilar/mediastinal mass, progressive. Associated apical pleural thickening versus fluid, new. Right lung is clear.  No pneumothorax. The heart is normal in size. IMPRESSION: Left perihilar/mediastinal mass in this patient with known T4-6 tumor, progressive. Associated apical pleural thickening/versus fluid, new. Electronically Signed   By: Julian Hy M.D.   On: 04/01/2021 20:24   VAS [place] LOWER EXTREMITY VENOUS (DVT)  Result Date: 04/02/2021  Lower Venous DVT Study Patient Name:  Bradley Oliver. [facility]  Date of Exam:   04/02/2021 Medical Rec #: [id number]          Accession #:    [id number] Date of Birth: [birth year]          Patient Gender: M Patient Age:   41 years Exam Location:  [facility] Procedure:      VAS [place] LOWER EXTREMITY VENOUS (DVT) Referring Phys: Wandra Feinstein RATHORE --------------------------------------------------------------------------------  Indications: Elevated Ddimer.  Risk Factors: Cancer. Limitations: Patient positioning and body habitus. Comparison Study: No prior studies. Performing Technologist: Oliver Hum RVT  Examination Guidelines: A complete evaluation includes B-mode imaging, spectral Doppler, color Doppler, and power Doppler as needed of all accessible portions of each vessel. Bilateral testing is considered an integral part of a complete examination. Limited examinations for reoccurring indications may be performed as noted. The reflux portion of the exam is performed with the patient in reverse Trendelenburg.  +---------+---------------+---------+-----------+----------+--------------+ RIGHT    CompressibilityPhasicitySpontaneityPropertiesThrombus Aging  +---------+---------------+---------+-----------+----------+--------------+ CFV      Full           Yes      Yes                                 +---------+---------------+---------+-----------+----------+--------------+  SFJ      Full                                                        +---------+---------------+---------+-----------+----------+--------------+ FV Prox  Full                                                        +---------+---------------+---------+-----------+----------+--------------+ FV Mid   Full                                                        +---------+---------------+---------+-----------+----------+--------------+ FV DistalFull                                                        +---------+---------------+---------+-----------+----------+--------------+ PFV      Full                                                        +---------+---------------+---------+-----------+----------+--------------+ POP      Full           Yes      Yes                                 +---------+---------------+---------+-----------+----------+--------------+ PTV      Full                                                        +---------+---------------+---------+-----------+----------+--------------+ PERO     Full                                                        +---------+---------------+---------+-----------+----------+--------------+   +---------+---------------+---------+-----------+----------+--------------+ LEFT     CompressibilityPhasicitySpontaneityPropertiesThrombus Aging +---------+---------------+---------+-----------+----------+--------------+ CFV      Full           Yes      Yes                                 +---------+---------------+---------+-----------+----------+--------------+ SFJ      Full                                                         +---------+---------------+---------+-----------+----------+--------------+  FV Prox  Full                                                        +---------+---------------+---------+-----------+----------+--------------+ FV Mid   Full                                                        +---------+---------------+---------+-----------+----------+--------------+ FV DistalFull                                                        +---------+---------------+---------+-----------+----------+--------------+ PFV      Full                                                        +---------+---------------+---------+-----------+----------+--------------+ POP      Full           Yes      Yes                                 +---------+---------------+---------+-----------+----------+--------------+ PTV      Full                                                        +---------+---------------+---------+-----------+----------+--------------+ PERO     Full                                                        +---------+---------------+---------+-----------+----------+--------------+     Summary: RIGHT: - There is no evidence of deep vein thrombosis in the lower extremity.  - No cystic structure found in the popliteal fossa.  LEFT: - There is no evidence of deep vein thrombosis in the lower extremity.  - No cystic structure found in the popliteal fossa.  *See table(s) above for measurements and observations. Electronically signed by Servando Snare MD on 04/02/2021 at 4:26:00 PM.    Final     Assessment and Plan:   1.  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, stage IVa (T2N2bM0) HPV positive 08/2014-09/2014-CRT 70 Gray in 35 fractions with cisplatin 20 mg per metered squared days 1-5, weeks 1 and 5 Recurrent disease April 2019-new left upper lobe cavitary lesion, infiltrative left hilar mass, prevascular  and mediastinal adenopathy 09/28/2017-flexible bronchoscopy with EBUS-biopsy station 7 node, squamous cell carcinoma p16 positive 10/2017-12/2018-systemic therapy, carboplatin/docetaxel/pembrolizumab x5 followed by maintenance pembrolizumab, progression in 06/2018, 09/2018, and 12/2018 while on pembrolizumab-continue due  to slow mild progression 07/13/2018-disease progression with new left upper lobe posterior segment lesion, new left upper lobe nodule, new left apical nodularity with subsequent scans 10/05/2018 and 01/11/2019 confirming disease progression 10/30/2020-PET add [facility] posterior mediastinal mass involving T5 and T6 and left posterior fifth and sixth ribs, hypermetabolic soft tissue within the prevascular mediastinum contiguous with the posterior mediastinal mass, hypermetabolic spiculated left upper lobe nodules, small left pleural effusion, focal small bowel uptake in left upper quadrant abutting the transverse colon Leg weakness and lower body numbness beginning 03/24/2021, fall prior to presenting to the emergency room 04/01/2021 MRI cervical, thoracic, and lumbar spine level 04/02/2021-no evidence of metastatic disease of the cervical spine, 8 mm enhancing lesion in the inferior aspect of L5-nonspecific, no other evidence of metastatic disease in the lumbar spine, large destructive and infiltrative metastatic deposit centered at the left paraspinous region involving T4-T7 with associated epidural extension with severe spinal stenosis, cord compression, cord signal changes at T4-5 through T6-7, metastatic lesion extends to involve the posterior medial aspect of the left lung and left mediastinum/hilar region, pathologic fracture of T5, 1 cm enhancing lesion in T11 indeterminate  2.  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  He has been treated with  chemotherapy/radiation and a course of docetaxel/carboplatin/pembrolizumab.  He presents with a cord compression syndrome secondary to a destructive/infiltrative mass involving thoracic spine, mediastinum, and left chest.  I reviewed the CT images.  I discussed the case with Dr. Reatha Armour.  I reviewed treatment options with Bradley Oliver and his family.  He understands no therapy will be curative.  Standard systemic treatment options are limited.  Systemic therapy is not expected to achieve a rapid clinical benefit.  He has discussed standard systemic treatment options in clinical trials with multiple providers at other institutions.  We discussed the urgency to begin treatment in an attempt to prevent permanent paralysis.  We discussed surgery and radiation options.  I think it is reasonable to consider surgery since he does not appear to have a large systemic tumor burden and the tumor appears to be progressing in an indolent fashion.  He will have further discussions with Dr. Reatha Armour.  Bradley Oliver initial decision is to proceed with surgery if recommended by Dr. Reatha Armour.  He understands surgery is not guaranteed to improve his neurologic status and will not cure the cancer.  The goal of surgery is to decompress the spinal cord.  All tumor will not be removed.  He will need postoperative therapy.  Recommendations: Decision on proceeding with surgery per Bradley Oliver and Dr. Reatha Armour Consult Dr. Tresa Moore for urgent radiation if surgery is not planned Continue Decadron Please call medical oncology as needed, I will check on him 04/05/2021 He can have medical oncology follow-up in [place] or with his previous medical oncology providers at [place], [facility]. Betsy Coder, MD 04/02/2021, 6:22 PM

## 2021-04-02 NOTE — ED Notes (Signed)
Pt's sister called at his request. Updated that he is still waiting in ED and no need to visit at this time. She stated that famly would visit this afternoon. Pt is comfortable at this time. Resting. Declined breakfast.

## 2021-04-02 NOTE — H&P (Signed)
History and Physical    Bradley Oliver. Tobin Chad TTS:[id number] DOB: 1956-03-15 DOA: 04/01/2021  PCP: Patient, [name] Patient coming from: Home  Chief Complaint: Fall  HPI: Bradley Oliver is a 65 y.o. 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  D-dimer elevated but CT angiogram chest negative for PE; showing medial left lung mass extending to the mediastinum.  Moderate partially loculated left pleural effusion, trace right pleural effusion.  Moderate pathologic compression fracture at T5.  MRI of C-spine showing no evidence of metastatic disease.  MRI of lumbar spine showing a 8 mm enhancing lesion within the inferior aspect of the L5 vertebral body suspicious for metastatic lesion.  MRI of thoracic spine showing large destructive and infiltrative metastatic deposit centered at the left paraspinous region with involvement of the T4-T7 vertebral bodies.  Associated epidural extension with severe spinal stenosis, cord compression, and cord signal changes at the levels of T4-5 through T6-7.  Metastatic lesion extends to involve the adjacent posterior medial aspect of the left lung as well as the left adjacent mediastinum and left hilar/perihilar region.  Associated pathologic fracture of T5.  1 cm enhancing lesion within the T11 vertebral body suspicious for metastatic lesion.  Neurosurgery consulted by ED physician.  Patient reports 1 week history of bilateral lower extremity weakness and numbness.  The numbness extends all the way up to his sternum.  He is not able to stand up and walk as he feels very unsteady.  Yesterday he fell but states he did not injure himself and there were family members around him.  He is not having any difficulty urinating but  having difficulty with bowel movements.  He started having back pain this summer and also chest tightness since then.  He feels short of breath with exertion.  Patient states he finished treatment for his cancer at [facility] in 2020.  He was seen at [facility] this year and advised to [facility].  Review of Systems:  All systems reviewed and apart from history of presenting illness, are negative.  Past Medical History:  Diagnosis Date   Blood in stool    Cancer ([place]) 08/05/14   right tonsil, P16 positive    Past Surgical History:  Procedure Laterality Date   EYE SURGERY     KNEE SURGERY     tonsil biopsy Right 08/05/14   invasive squamous cell carcinoma     reports that he has quit smoking. He has never used smokeless tobacco. He reports that he does not currently use alcohol. He reports that he does not use drugs.  No Known Allergies  Family History  Problem Relation Age of Onset   Hypertension Mother    Diabetes Father    Diabetes Paternal Grandmother    Diabetes Paternal Grandfather     Prior to Admission medications   Medication Sig Start Date End Date Taking? Authorizing Provider  levothyroxine (SYNTHROID) 50 MCG tablet Take 50 mcg by mouth daily. 03/28/21  Yes [provider]  Multiple Vitamins-Minerals (MULTIVITAMIN ADULT) TABS Take 1 tablet by mouth daily. with Omega 3   Yes [provider]  UNABLE TO FIND Take 1 tablet by mouth 2 (two) times daily as needed (for severe pain). CBD gummies Highest Strength   Yes [provider]  lidocaine (  LIDODERM) 5 % Place 1 patch onto the skin daily as needed (pain). 03/26/21   [provider]    Physical Exam: Vitals:   04/02/21 0045 04/02/21 0130 04/02/21 0215 04/02/21 0430  BP: 114/73 (!) 127/115 136/90 115/79  Pulse: 81 (!) 105 87 79  Resp: 12 (!) 22 18 16   Temp:      SpO2: 97% 99% 96% 98%  Weight:      Height:        Physical Exam Constitutional:      General: He is not  in acute distress. HENT:     Head: Normocephalic and atraumatic.  Eyes:     Extraocular Movements: Extraocular movements intact.     Conjunctiva/sclera: Conjunctivae normal.  Cardiovascular:     Rate and Rhythm: Normal rate and regular rhythm.     Pulses: Normal pulses.  Pulmonary:     Effort: Pulmonary effort is normal. No respiratory distress.     Breath sounds: No wheezing or rales.  Abdominal:     General: Bowel sounds are normal. There is no distension.     Palpations: Abdomen is soft.     Tenderness: There is no abdominal tenderness.  Musculoskeletal:        General: No swelling or tenderness.     Cervical back: Normal range of motion and neck supple.  Skin:    General: Skin is warm and dry.  Neurological:     Mental Status: He is alert.     Comments: Strength and sensation to light touch grossly intact in bilateral upper extremities.  Bilateral lower extremity numbness extending up to the abdomen.  Bilateral lower extremities are weak but he is able to raise his legs up slightly from the bed against gravity.     Labs on Admission: I have personally reviewed following labs and imaging studies  CBC: Recent Labs  Lab 04/01/21 1912  WBC 6.6  NEUTROABS 5.4  HGB 14.0  HCT 40.9  MCV 88.1  PLT 409   Basic Metabolic Panel: Recent Labs  Lab 04/01/21 1912  NA 137  K 3.4*  CL 101  CO2 27  GLUCOSE 97  BUN 20  CREATININE 0.72  CALCIUM 9.3   GFR: Estimated Creatinine Clearance: 65.8 mL/min (by C-G formula based on SCr of 0.72 mg/dL). Liver Function Tests: Recent Labs  Lab 04/01/21 1912  AST 27  ALT 11  ALKPHOS 82  BILITOT 1.2  PROT 7.2  ALBUMIN 4.2   No results for input(s): LIPASE, AMYLASE in the last 168 hours. No results for input(s): AMMONIA in the last 168 hours. Coagulation Profile: No results for input(s): INR, PROTIME in the last 168 hours. Cardiac Enzymes: No results for input(s): CKTOTAL, CKMB, CKMBINDEX, TROPONINI in the last 168 hours. BNP  (last 3 results) No results for input(s): PROBNP in the last 8760 hours. HbA1C: No results for input(s): HGBA1C in the last 72 hours. CBG: Recent Labs  Lab 04/01/21 2002  GLUCAP 104*   Lipid Profile: No results for input(s): CHOL, HDL, LDLCALC, TRIG, CHOLHDL, LDLDIRECT in the last 72 hours. Thyroid Function Tests: No results for input(s): TSH, T4TOTAL, FREET4, T3FREE, THYROIDAB in the last 72 hours. Anemia Panel: No results for input(s): VITAMINB12, FOLATE, FERRITIN, TIBC, IRON, RETICCTPCT in the last 72 hours. Urine analysis:    Component Value Date/Time   COLORURINE YELLOW 04/01/2021 1928   APPEARANCEUR CLEAR 04/01/2021 1928   LABSPEC 1.012 04/01/2021 1928   PHURINE 5.0 04/01/2021 1928   GLUCOSEU NEGATIVE 04/01/2021 1928  HGBUR NEGATIVE 04/01/2021 1928   BILIRUBINUR NEGATIVE 04/01/2021 1928   KETONESUR 5 (A) 04/01/2021 1928   PROTEINUR NEGATIVE 04/01/2021 1928   NITRITE NEGATIVE 04/01/2021 1928   LEUKOCYTESUR NEGATIVE 04/01/2021 1928    Radiological Exams on Admission: CT Angio Chest PE W and/or Wo Contrast  Result Date: 04/02/2021 CLINICAL DATA:  Weakness, T4-6 tumor, history of right tonsillar cancer, elevated D-dimer, evaluate for PE EXAM: CT ANGIOGRAPHY CHEST WITH CONTRAST TECHNIQUE: Multidetector CT imaging of the chest was performed using the standard protocol during bolus administration of intravenous contrast. Multiplanar CT image reconstructions and MIPs were obtained to evaluate the vascular anatomy. CONTRAST:  27mL OMNIPAQUE IOHEXOL 350 MG/ML SOLN COMPARISON:  None. FINDINGS: Cardiovascular: Satisfactory opacification of the bilateral pulmonary arteries to the lobar level. Narrowing of the left main pulmonary artery due to tumor. No evidence of pulmonary embolism. Although not tailored for evaluation of the thoracic aorta, there is no evidence thoracic aortic aneurysm. Mild atherosclerotic calcifications. The heart is normal in size.  No pericardial effusion.  Mediastinum/Nodes: Tumor along the anterior mediastinum/AP window and extending to the medial lung. This will be described below. Visualized thyroid is unremarkable. Lungs/Pleura: Mass involving the medial aspect of the left upper lobe and superior segment left lower lobe, extending to the mediastinum, measuring 14.1 x 4.7 cm (series 6/image 93). Moderate left pleural effusion, partially loculated, with associated apical capping. Associated left lower lobe opacity, likely compressive atelectasis. Trace right pleural effusion. 9 mm nodule in the superior segment right lower lobe (series 7/image 36). No pneumothorax. Upper Abdomen: Visualized upper abdomen is grossly unremarkable. Musculoskeletal: Moderate compression fracture at T5, pathologic. Total spine MRI has been performed and further evaluation will be deferred to that study. Otherwise, no focal osseous lesions. Review of the MIP images confirms the above findings. IMPRESSION: No evidence of pulmonary embolism. 14.1 x 4.7 cm mass involving the medial left lung and extending to the mediastinum, as above. Moderate left pleural effusion, partially loculated, with apical capping. Trace right pleural effusion. Moderate pathologic compression fracture at T5. Additional spinal involvement will be described on dedicated total spine MR, reported separately. Aortic Atherosclerosis (ICD10-I70.0). Electronically Signed   By: Julian Hy M.D.   On: 04/02/2021 01:31   MR Cervical Spine W or Wo Contrast  Result Date: 04/02/2021 CLINICAL DATA:  Initial evaluation for metastatic disease evaluation. EXAM: MRI CERVICAL SPINE WITHOUT AND WITH CONTRAST TECHNIQUE: Multiplanar and multiecho pulse sequences of the cervical spine, to include the craniocervical junction and cervicothoracic junction, were obtained without and with intravenous contrast. CONTRAST:  5.58mL GADAVIST GADOBUTROL 1 MMOL/ML IV SOLN COMPARISON:  None. FINDINGS: Alignment: Physiologic with preservation  of the normal cervical lordosis. No listhesis. Vertebrae: Vertebral body height maintained without acute or chronic fracture. Bone marrow signal intensity within normal limits. No discrete osseous lesions or evidence for metastatic disease. No abnormal edema or enhancement. Cord: Normal signal morphology. Posterior Fossa, vertebral arteries, paraspinal tissues: Visualized brain and posterior fossa within normal limits. Craniocervical junction normal. Paraspinous soft tissues within normal limits. Normal flow voids seen within the vertebral arteries bilaterally. Disc levels: C2-C3: Right paracentral disc protrusion indents the right ventral thecal sac. No significant spinal stenosis or cord impingement. Foramina remain patent. C3-C4: Right paracentral disc protrusion indents the right ventral thecal sac. No significant spinal stenosis or cord impingement. Foramina remain patent. C4-C5: Mild degenerative intervertebral disc space narrowing. Superimposed right paracentral disc protrusion indents the ventral thecal sac, contacting and minimally flattening the ventral cord. Thecal sac remains widely  patent without significant spinal stenosis. Bilateral uncovertebral spurring with resultant mild right C5 foraminal stenosis. Left neural foramen remains patent. C5-C6: Mild degenerative intervertebral disc space narrowing. Right eccentric disc bulge with associated right greater than left uncovertebral spurring. Broad posterior disc osteophyte mildly flattens the ventral thecal sac without significant spinal stenosis. Moderate right C6 foraminal narrowing. Left neural foramina remains patent. C6-C7: Mild degenerative intervertebral disc space narrowing. Right eccentric disc osteophyte complex mildly flattens the ventral thecal sac without significant spinal stenosis. Mild to moderate right C7 foraminal narrowing. Left neural foramen remains patent. C7-T1: Minimal disc bulge. Mild facet hypertrophy. No spinal stenosis.  Foramina remain patent. IMPRESSION: 1. No evidence for metastatic disease within the cervical spine. 2. Small right paracentral disc protrusions at C2-3 through C4-5 without significant spinal stenosis. 3. Right eccentric disc osteophyte complexes at C5-6 and C6-7 with resultant mild to moderate right C6 and C7 foraminal stenosis as above. Electronically Signed   By: Jeannine Boga M.D.   On: 04/02/2021 01:05   MR THORACIC SPINE W WO CONTRAST  Result Date: 04/02/2021 CLINICAL DATA:  Initial metastatic evaluation. EXAM: MRI THORACIC WITHOUT AND WITH CONTRAST TECHNIQUE: Multiplanar and multiecho pulse sequences of the thoracic spine were obtained without and with intravenous contrast. CONTRAST:  5.15mL GADAVIST GADOBUTROL 1 MMOL/ML IV SOLN COMPARISON:  None available. FINDINGS: Alignment: Dextroscoliosis. Focal kyphotic angulation of the thoracic spine at the level of T5. No listhesis. Vertebrae: Large contiguous infiltrative and destructive metastatic deposit seen involving the upper-midthoracic spine. Lesion involves the T4 through T7 vertebral bodies, and is centered at the left paraspinous region. Although measurements are somewhat difficult, lesion measures approximately 7.7 x 8.4 x 10.9 cm in greatest dimensions (AP by transverse by craniocaudad). Lesion involves the left hilar/perihilar region, tracking posteriorly along the posteromedial left lung, involving the adjacent left mediastinum and partially encasing the aorta. Direct extension of this mass into the adjacent posterior ribs and T4 through T7 vertebral bodies, with involvement of their posterior elements at several levels. There is an associated pathologic fracture of T5 with approximately 60% height loss. Epidural extension of tumor to involve the spinal canal, extending from T3 through T7. This is most pronounced at the level of T5 where there is circumferential epidural involvement. Associated severe spinal stenosis at the level of T5 with  cord compression and associated cord signal changes (series 17, image 8). Thecal sac measures 4 mm in AP diameter at its most narrow point. Tumor also extends to involve the left neural foramina at T4-5 through T6-7, with probable early extension into the left T3-4 and T7-8 foramina. Involvement of the right T4-5 and T6-7 neural foramina noted as well. There is an additional 1 cm T1 hypointense enhancing lesion within the T11 vertebral body, indeterminate, but could reflect an additional metastatic lesion (series 19, image 7). Otherwise, no other metastatic disease seen within the thoracic spine. Multiple scattered benign hemangiomata noted. No other acute or chronic fracture. Cord: Epidural tumor with associated severe spinal stenosis and cord signal changes at T4-5 through T6-7 as above, most pronounced at the T5 level. Otherwise, signal intensity within the thoracic cord is within normal limits. No other epidural involvement. Paraspinal and other soft tissues: Metastatic lesion involving the left paraspinous and posteromedial left lung at T4 through T7 as above. Associated atelectasis with irregular and partially loculated left pleural effusion partially visualized. Direct extension of tumor to involve the left posterior soft tissues of the left mid back noted as well. Disc levels: No significant  underlying disc pathology for age. No other significant stenosis or impingement. IMPRESSION: 1. Large destructive and infiltrative metastatic deposit centered at the left paraspinous region, with involvement of the T4 through T7 vertebral bodies as above. Associated epidural extension with severe spinal stenosis, cord compression, and cord signal changes at the levels of T4-5 through T6-7. 2. Metastatic lesion extends to involve the adjacent posteromedial aspect of the left lung as well as the left adjacent mediastinum and left hilar/perihilar region, also seen on corresponding CTA of the chest. 3. Associated pathologic  fracture of T5 with up to 60% height loss. 4. 1 cm enhancing lesion within the T11 vertebral body, indeterminate, but could reflect an additional metastatic lesion. Attention at follow-up recommended. Critical Value/emergent results were called by telephone at the time of interpretation on 04/02/2021 at 1:32 am to provider Dr. Florina Ou , who verbally acknowledged these results. Electronically Signed   By: Jeannine Boga M.D.   On: 04/02/2021 01:41   MR Lumbar Spine W Wo Contrast  Result Date: 04/02/2021 CLINICAL DATA:  Initial evaluation for metastatic evaluation. EXAM: MRI LUMBAR SPINE WITHOUT AND WITH CONTRAST TECHNIQUE: Multiplanar and multiecho pulse sequences of the lumbar spine were obtained without and with intravenous contrast. CONTRAST:  5.44mL GADAVIST GADOBUTROL 1 MMOL/ML IV SOLN COMPARISON:  None available. FINDINGS: Segmentation: Standard. Lowest well-formed disc space labeled the L5-S1 level. Alignment: Physiologic with preservation of the normal lumbar lordosis. No listhesis. Vertebrae: Vertebral body height maintained without acute or chronic fracture. Prominent benign hemangioma noted within the T12 vertebral body. There is a 8 mm T1 hypointense enhancing lesion within the inferior aspect of the L5 vertebral body, nonspecific, but could reflect a small metastatic lesion (series 23, image 6). No other evidence for metastatic disease seen within the lumbar spine. Conus medullaris and cauda equina: Conus extends to the L1 level. Conus and cauda equina appear normal. Paraspinal and other soft tissues: Mild diffuse edema within the lower posterior paraspinous musculature, nonspecific, but could reflect muscular injury and/or strain. Soft tissues demonstrate no other acute finding. Prominent distension of the partially visualized urinary bladder. Visualized visceral structures otherwise unremarkable. Disc levels: L1-2:  Negative interspace.  Mild facet hypertrophy.  No stenosis. L2-3:  Negative  interspace.  Mild facet hypertrophy.  No stenosis. L3-4:  Negative interspace.  Mild facet hypertrophy.  No stenosis. L4-5: Disc desiccation with minimal disc bulge. Superimposed tiny central disc protrusion with annular fissure. Mild facet hypertrophy. Mild narrowing of the lateral recesses. Central canal remains patent. No foraminal stenosis. L5-S1: Degenerative intervertebral disc space narrowing with disc desiccation and mild diffuse disc bulge. Mild reactive endplate spurring. Mild facet hypertrophy. No significant spinal stenosis. Foramina remain patent. IMPRESSION: 1. 8 mm enhancing lesion within the inferior aspect of the L5 vertebral body, nonspecific, but could reflect a small metastatic lesion. Attention at follow-up recommended. 2. No other evidence for metastatic disease within the lumbar spine. 3. Mild diffuse edema within the lower posterior paraspinous musculature, nonspecific, but could reflect muscular injury and/or strain. 4. Prominent distension of the visualized urinary bladder. Query urinary retention. 5. Mild for age spondylosis without significant stenosis. Electronically Signed   By: Jeannine Boga M.D.   On: 04/02/2021 01:51   DG Chest Portable 1 View  Result Date: 04/01/2021 CLINICAL DATA:  Chest tightness, tumor at T4-6 EXAM: PORTABLE CHEST 1 VIEW COMPARISON:  11/09/2017 FINDINGS: Left perihilar/mediastinal mass, progressive. Associated apical pleural thickening versus fluid, new. Right lung is clear.  No pneumothorax. The heart is normal in size.  IMPRESSION: Left perihilar/mediastinal mass in this patient with known T4-6 tumor, progressive. Associated apical pleural thickening/versus fluid, new. Electronically Signed   By: Julian Hy M.D.   On: 04/01/2021 20:24    EKG: Independently reviewed.  Sinus rhythm, borderline T wave abnormalities.  No prior tracing for comparison.  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  Last seen by oncology in June 2022 at [facility] and it was felt that there were no treatment options at their facility, patient was advised to follow[facility].  Presenting with complaints of bilateral lower extremity weakness and numbness extending up to his abdomen.  No urinary retention.  CT chest showing medial left lung mass extending to the mediastinum.  MRI of C-spine showing no evidence of metastatic disease.  MRI of lumbar spine showing a 8 mm enhancing lesion within the inferior aspect of the L5 vertebral body suspicious for metastatic lesion.  MRI of thoracic spine showing large destructive and infiltrative metastatic deposit centered at the left paraspinous region with involvement of the T4-T7 vertebral bodies.  Associated epidural extension with severe spinal stenosis, cord compression, and cord signal changes at the levels of T4-5 through T6-7.  Metastatic lesion extends to involve the adjacent posterior medial aspect of the left lung as well as the left adjacent mediastinum and left hilar/perihilar region.  Associated pathologic fracture of T5.  1 cm enhancing lesion within the T11 vertebral body suspicious for metastatic lesion. -Start IV Decadron 10 mg followed by 4 mg every 6 hours.  Neurosurgery consulted by ED physician.  May need radiation oncology involvement as well.  Palliative care consulted.  Pleural effusions CT showing moderate partially loculated left pleural effusion, trace right pleural effusion.  No fever, leukocytosis, or signs of sepsis.  Patient is endorsing dyspnea with exertion.  Not hypoxic. -IR ultrasound-guided thoracentesis, pleural fluid analysis labs  ordered  Elevated D-dimer CT angiogram chest negative for PE. -Bilateral lower extremity Dopplers ordered to rule out DVT  Mild hypokalemia -Monitor potassium and magnesium levels, replenish as needed.  Hypothyroidism -Continue Synthroid  DVT prophylaxis: Hold chemical DVT prophylaxis until patient is seen by neurosurgery and also until thoracentesis is done.  No SCDs until Dopplers rule out DVT. Code Status: Patient wishes to be DNR. Family Communication: No family available at this time. Disposition Plan: Status is: Inpatient  Remains inpatient appropriate because: Bilateral lower extremity weakness and numbness due to neoplastic spinal cord compression, unable to ambulate  Level of care: Level of care: Med-Surg  The medical decision making on this patient was of high complexity and the patient is at high risk for clinical deterioration, therefore this is a level 3 visit.  Shela Leff MD [facility]  If 7PM-7AM, please contact night-coverage [email or web address]  04/02/2021, 5:17 AM

## 2021-04-02 NOTE — ED Notes (Signed)
Pt transported to [place] for thoracentesis.

## 2021-04-02 NOTE — ED Provider Notes (Signed)
Nursing notes and vitals signs, including pulse oximetry, reviewed.  Summary of this visit's results, reviewed by myself:  EKG:  EKG Interpretation  Date/Time:  Thursday April 01 2021 19:28:12 EST Ventricular Rate:  77 PR Interval:  119 QRS Duration: 83 QT Interval:  373 QTC Calculation: 423 R Axis:   80 Text Interpretation: Sinus rhythm Borderline short PR interval Borderline T wave abnormalities No prior tracing Confirmed by Wynona Dove ([id number]) on 04/02/2021 12:22:27 AM        Labs:  Results for orders placed or performed during the hospital encounter of 04/01/21 (from the past 24 hour(s))  CBC with Differential     Status: Abnormal   Collection Time: 04/01/21  7:12 PM  Result Value Ref Range   WBC 6.6 4.0 - 10.5 K/uL   RBC 4.64 4.22 - 5.81 MIL/uL   Hemoglobin 14.0 13.0 - 17.0 g/dL   HCT 40.9 39.0 - 52.0 %   MCV 88.1 80.0 - 100.0 fL   MCH 30.2 26.0 - 34.0 pg   MCHC 34.2 30.0 - 36.0 g/dL   RDW 12.6 11.5 - 15.5 %   Platelets 274 150 - 400 K/uL   nRBC 0.0 0.0 - 0.2 %   Neutrophils Relative % 79 %   Neutro Abs 5.4 1.7 - 7.7 K/uL   Lymphocytes Relative 8 %   Lymphs Abs 0.5 (L) 0.7 - 4.0 K/uL   Monocytes Relative 10 %   Monocytes Absolute 0.6 0.1 - 1.0 K/uL   Eosinophils Relative 1 %   Eosinophils Absolute 0.0 0.0 - 0.5 K/uL   Basophils Relative 1 %   Basophils Absolute 0.0 0.0 - 0.1 K/uL   Immature Granulocytes 1 %   Abs Immature Granulocytes 0.04 0.00 - 0.07 K/uL  Comprehensive metabolic panel     Status: Abnormal   Collection Time: 04/01/21  7:12 PM  Result Value Ref Range   Sodium 137 135 - 145 mmol/L   Potassium 3.4 (L) 3.5 - 5.1 mmol/L   Chloride 101 98 - 111 mmol/L   CO2 27 22 - 32 mmol/L   Glucose, Bld 97 70 - 99 mg/dL   BUN 20 8 - 23 mg/dL   Creatinine, Ser 0.72 0.61 - 1.24 mg/dL   Calcium 9.3 8.9 - 10.3 mg/dL   Total Protein 7.2 6.5 - 8.1 g/dL   Albumin 4.2 3.5 - 5.0 g/dL   AST 27 15 - 41 U/L   ALT 11 0 - 44 U/L   Alkaline Phosphatase 82 38 - 126  U/L   Total Bilirubin 1.2 0.3 - 1.2 mg/dL   GFR, Estimated >60 >60 mL/min   Anion gap 9 5 - 15  Troponin I (High Sensitivity)     Status: None   Collection Time: 04/01/21  7:12 PM  Result Value Ref Range   Troponin I (High Sensitivity) 3 <18 ng/L  D-dimer, quantitative     Status: Abnormal   Collection Time: 04/01/21  7:12 PM  Result Value Ref Range   D-Dimer, Quant 1.69 (H) 0.00 - 0.50 ug/mL-FEU  Urinalysis, Routine w reflex microscopic Urine, Clean Catch     Status: Abnormal   Collection Time: 04/01/21  7:28 PM  Result Value Ref Range   Color, Urine YELLOW YELLOW   APPearance CLEAR CLEAR   Specific Gravity, Urine 1.012 1.005 - 1.030   pH 5.0 5.0 - 8.0   Glucose, UA NEGATIVE NEGATIVE mg/dL   Hgb urine dipstick NEGATIVE NEGATIVE   Bilirubin Urine NEGATIVE NEGATIVE  Ketones, ur 5 (A) NEGATIVE mg/dL   Protein, ur NEGATIVE NEGATIVE mg/dL   Nitrite NEGATIVE NEGATIVE   Leukocytes,Ua NEGATIVE NEGATIVE  CBG monitoring, ED     Status: Abnormal   Collection Time: 04/01/21  8:02 PM  Result Value Ref Range   Glucose-Capillary 104 (H) 70 - 99 mg/dL    Imaging Studies: CT Angio Chest PE W and/or Wo Contrast  Result Date: 04/02/2021 CLINICAL DATA:  Weakness, T4-6 tumor, history of right tonsillar cancer, elevated D-dimer, evaluate for PE EXAM: CT ANGIOGRAPHY CHEST WITH CONTRAST TECHNIQUE: Multidetector CT imaging of the chest was performed using the standard protocol during bolus administration of intravenous contrast. Multiplanar CT image reconstructions and MIPs were obtained to evaluate the vascular anatomy. CONTRAST:  46mL OMNIPAQUE IOHEXOL 350 MG/ML SOLN COMPARISON:  None. FINDINGS: Cardiovascular: Satisfactory opacification of the bilateral pulmonary arteries to the lobar level. Narrowing of the left main pulmonary artery due to tumor. No evidence of pulmonary embolism. Although not tailored for evaluation of the thoracic aorta, there is no evidence thoracic aortic aneurysm. Mild  atherosclerotic calcifications. The heart is normal in size.  No pericardial effusion. Mediastinum/Nodes: Tumor along the anterior mediastinum/AP window and extending to the medial lung. This will be described below. Visualized thyroid is unremarkable. Lungs/Pleura: Mass involving the medial aspect of the left upper lobe and superior segment left lower lobe, extending to the mediastinum, measuring 14.1 x 4.7 cm (series 6/image 93). Moderate left pleural effusion, partially loculated, with associated apical capping. Associated left lower lobe opacity, likely compressive atelectasis. Trace right pleural effusion. 9 mm nodule in the superior segment right lower lobe (series 7/image 36). No pneumothorax. Upper Abdomen: Visualized upper abdomen is grossly unremarkable. Musculoskeletal: Moderate compression fracture at T5, pathologic. Total spine MRI has been performed and further evaluation will be deferred to that study. Otherwise, no focal osseous lesions. Review of the MIP images confirms the above findings. IMPRESSION: No evidence of pulmonary embolism. 14.1 x 4.7 cm mass involving the medial left lung and extending to the mediastinum, as above. Moderate left pleural effusion, partially loculated, with apical capping. Trace right pleural effusion. Moderate pathologic compression fracture at T5. Additional spinal involvement will be described on dedicated total spine MR, reported separately. Aortic Atherosclerosis (ICD10-I70.0). Electronically Signed   By: Julian Hy M.D.   On: 04/02/2021 01:31   MR Cervical Spine W or Wo Contrast  Result Date: 04/02/2021 CLINICAL DATA:  Initial evaluation for metastatic disease evaluation. EXAM: MRI CERVICAL SPINE WITHOUT AND WITH CONTRAST TECHNIQUE: Multiplanar and multiecho pulse sequences of the cervical spine, to include the craniocervical junction and cervicothoracic junction, were obtained without and with intravenous contrast. CONTRAST:  5.79mL GADAVIST GADOBUTROL 1  MMOL/ML IV SOLN COMPARISON:  None. FINDINGS: Alignment: Physiologic with preservation of the normal cervical lordosis. No listhesis. Vertebrae: Vertebral body height maintained without acute or chronic fracture. Bone marrow signal intensity within normal limits. No discrete osseous lesions or evidence for metastatic disease. No abnormal edema or enhancement. Cord: Normal signal morphology. Posterior Fossa, vertebral arteries, paraspinal tissues: Visualized brain and posterior fossa within normal limits. Craniocervical junction normal. Paraspinous soft tissues within normal limits. Normal flow voids seen within the vertebral arteries bilaterally. Disc levels: C2-C3: Right paracentral disc protrusion indents the right ventral thecal sac. No significant spinal stenosis or cord impingement. Foramina remain patent. C3-C4: Right paracentral disc protrusion indents the right ventral thecal sac. No significant spinal stenosis or cord impingement. Foramina remain patent. C4-C5: Mild degenerative intervertebral disc space narrowing. Superimposed right paracentral  disc protrusion indents the ventral thecal sac, contacting and minimally flattening the ventral cord. Thecal sac remains widely patent without significant spinal stenosis. Bilateral uncovertebral spurring with resultant mild right C5 foraminal stenosis. Left neural foramen remains patent. C5-C6: Mild degenerative intervertebral disc space narrowing. Right eccentric disc bulge with associated right greater than left uncovertebral spurring. Broad posterior disc osteophyte mildly flattens the ventral thecal sac without significant spinal stenosis. Moderate right C6 foraminal narrowing. Left neural foramina remains patent. C6-C7: Mild degenerative intervertebral disc space narrowing. Right eccentric disc osteophyte complex mildly flattens the ventral thecal sac without significant spinal stenosis. Mild to moderate right C7 foraminal narrowing. Left neural foramen remains  patent. C7-T1: Minimal disc bulge. Mild facet hypertrophy. No spinal stenosis. Foramina remain patent. IMPRESSION: 1. No evidence for metastatic disease within the cervical spine. 2. Small right paracentral disc protrusions at C2-3 through C4-5 without significant spinal stenosis. 3. Right eccentric disc osteophyte complexes at C5-6 and C6-7 with resultant mild to moderate right C6 and C7 foraminal stenosis as above. Electronically Signed   By: Jeannine Boga M.D.   On: 04/02/2021 01:05   MR THORACIC SPINE W WO CONTRAST  Result Date: 04/02/2021 CLINICAL DATA:  Initial metastatic evaluation. EXAM: MRI THORACIC WITHOUT AND WITH CONTRAST TECHNIQUE: Multiplanar and multiecho pulse sequences of the thoracic spine were obtained without and with intravenous contrast. CONTRAST:  5.39mL GADAVIST GADOBUTROL 1 MMOL/ML IV SOLN COMPARISON:  None available. FINDINGS: Alignment: Dextroscoliosis. Focal kyphotic angulation of the thoracic spine at the level of T5. No listhesis. Vertebrae: Large contiguous infiltrative and destructive metastatic deposit seen involving the upper-midthoracic spine. Lesion involves the T4 through T7 vertebral bodies, and is centered at the left paraspinous region. Although measurements are somewhat difficult, lesion measures approximately 7.7 x 8.4 x 10.9 cm in greatest dimensions (AP by transverse by craniocaudad). Lesion involves the left hilar/perihilar region, tracking posteriorly along the posteromedial left lung, involving the adjacent left mediastinum and partially encasing the aorta. Direct extension of this mass into the adjacent posterior ribs and T4 through T7 vertebral bodies, with involvement of their posterior elements at several levels. There is an associated pathologic fracture of T5 with approximately 60% height loss. Epidural extension of tumor to involve the spinal canal, extending from T3 through T7. This is most pronounced at the level of T5 where there is circumferential  epidural involvement. Associated severe spinal stenosis at the level of T5 with cord compression and associated cord signal changes (series 17, image 8). Thecal sac measures 4 mm in AP diameter at its most narrow point. Tumor also extends to involve the left neural foramina at T4-5 through T6-7, with probable early extension into the left T3-4 and T7-8 foramina. Involvement of the right T4-5 and T6-7 neural foramina noted as well. There is an additional 1 cm T1 hypointense enhancing lesion within the T11 vertebral body, indeterminate, but could reflect an additional metastatic lesion (series 19, image 7). Otherwise, no other metastatic disease seen within the thoracic spine. Multiple scattered benign hemangiomata noted. No other acute or chronic fracture. Cord: Epidural tumor with associated severe spinal stenosis and cord signal changes at T4-5 through T6-7 as above, most pronounced at the T5 level. Otherwise, signal intensity within the thoracic cord is within normal limits. No other epidural involvement. Paraspinal and other soft tissues: Metastatic lesion involving the left paraspinous and posteromedial left lung at T4 through T7 as above. Associated atelectasis with irregular and partially loculated left pleural effusion partially visualized. Direct extension of tumor to  involve the left posterior soft tissues of the left mid back noted as well. Disc levels: No significant underlying disc pathology for age. No other significant stenosis or impingement. IMPRESSION: 1. Large destructive and infiltrative metastatic deposit centered at the left paraspinous region, with involvement of the T4 through T7 vertebral bodies as above. Associated epidural extension with severe spinal stenosis, cord compression, and cord signal changes at the levels of T4-5 through T6-7. 2. Metastatic lesion extends to involve the adjacent posteromedial aspect of the left lung as well as the left adjacent mediastinum and left  hilar/perihilar region, also seen on corresponding CTA of the chest. 3. Associated pathologic fracture of T5 with up to 60% height loss. 4. 1 cm enhancing lesion within the T11 vertebral body, indeterminate, but could reflect an additional metastatic lesion. Attention at follow-up recommended. Critical Value/emergent results were called by telephone at the time of interpretation on 04/02/2021 at 1:32 am to provider Dr. Florina Ou , who verbally acknowledged these results. Electronically Signed   By: Jeannine Boga M.D.   On: 04/02/2021 01:41   MR Lumbar Spine W Wo Contrast  Result Date: 04/02/2021 CLINICAL DATA:  Initial evaluation for metastatic evaluation. EXAM: MRI LUMBAR SPINE WITHOUT AND WITH CONTRAST TECHNIQUE: Multiplanar and multiecho pulse sequences of the lumbar spine were obtained without and with intravenous contrast. CONTRAST:  5.49mL GADAVIST GADOBUTROL 1 MMOL/ML IV SOLN COMPARISON:  None available. FINDINGS: Segmentation: Standard. Lowest well-formed disc space labeled the L5-S1 level. Alignment: Physiologic with preservation of the normal lumbar lordosis. No listhesis. Vertebrae: Vertebral body height maintained without acute or chronic fracture. Prominent benign hemangioma noted within the T12 vertebral body. There is a 8 mm T1 hypointense enhancing lesion within the inferior aspect of the L5 vertebral body, nonspecific, but could reflect a small metastatic lesion (series 23, image 6). No other evidence for metastatic disease seen within the lumbar spine. Conus medullaris and cauda equina: Conus extends to the L1 level. Conus and cauda equina appear normal. Paraspinal and other soft tissues: Mild diffuse edema within the lower posterior paraspinous musculature, nonspecific, but could reflect muscular injury and/or strain. Soft tissues demonstrate no other acute finding. Prominent distension of the partially visualized urinary bladder. Visualized visceral structures otherwise unremarkable. Disc  levels: L1-2:  Negative interspace.  Mild facet hypertrophy.  No stenosis. L2-3:  Negative interspace.  Mild facet hypertrophy.  No stenosis. L3-4:  Negative interspace.  Mild facet hypertrophy.  No stenosis. L4-5: Disc desiccation with minimal disc bulge. Superimposed tiny central disc protrusion with annular fissure. Mild facet hypertrophy. Mild narrowing of the lateral recesses. Central canal remains patent. No foraminal stenosis. L5-S1: Degenerative intervertebral disc space narrowing with disc desiccation and mild diffuse disc bulge. Mild reactive endplate spurring. Mild facet hypertrophy. No significant spinal stenosis. Foramina remain patent. IMPRESSION: 1. 8 mm enhancing lesion within the inferior aspect of the L5 vertebral body, nonspecific, but could reflect a small metastatic lesion. Attention at follow-up recommended. 2. No other evidence for metastatic disease within the lumbar spine. 3. Mild diffuse edema within the lower posterior paraspinous musculature, nonspecific, but could reflect muscular injury and/or strain. 4. Prominent distension of the visualized urinary bladder. Query urinary retention. 5. Mild for age spondylosis without significant stenosis. Electronically Signed   By: Jeannine Boga M.D.   On: 04/02/2021 01:51   DG Chest Portable 1 View  Result Date: 04/01/2021 CLINICAL DATA:  Chest tightness, tumor at T4-6 EXAM: PORTABLE CHEST 1 VIEW COMPARISON:  11/09/2017 FINDINGS: Left perihilar/mediastinal mass, progressive. Associated apical  pleural thickening versus fluid, new. Right lung is clear.  No pneumothorax. The heart is normal in size. IMPRESSION: Left perihilar/mediastinal mass in this patient with known T4-6 tumor, progressive. Associated apical pleural thickening/versus fluid, new. Electronically Signed   By: Julian Hy M.D.   On: 04/01/2021 20:24    2:18 AM Dr. Marlowe Sax to admit to hospitalist service.  Discussed with Mr. Glenford Peers of [place] neurosurgery, they  will see the patient in consultation.    Shanon Rosser, MD 04/02/21 [phone]

## 2021-04-02 NOTE — Progress Notes (Signed)
I discussed with the patient, his brother and his sister-in-law about his current imaging findings and neurologic status.  Given his significant cord compression and weakness in his legs as well as sensory changes, I recommend surgical decompression T4-T6 with T5 transpedicular decompression.  Given his instability, T5 fracture and SINS score of 16 I recommend T3-T7 pedicle screw instrumentation.  I discussed all risks, benefits and expected outcomes with the patient and his family.  I explained that this does not remove his cancer burden then he will ultimately need chemotherapy and radiation which he understands.  He understands the goal of the procedure is to preserve his current neurologic function and hopes for further improvement and maintaining his ambulatory status.  He has been unable to walk without assistance for approximately 1 week, has been using a cane and walker to help ambulate since.  I will add him on for urgent decompression and instrumentation and fusion.  This will be completed this evening.  I answered all their questions and spent greater than 30 minutes at bedside showing the images, explaining the current cancer burden as well as discussing the surgical risks, benefits and expected outcomes.   Thank you for allowing me to participate in this patient's care.  Please do not hesitate to call with questions or concerns.   Elwin Sleight, [facility] Cell: [phone]

## 2021-04-02 NOTE — Progress Notes (Signed)
Patient admitted to the hospital earlier this morning by Dr. Marlowe Sax  Patient seen and examined.  Currently lying in bed.  Appears to be comfortable.  Reports that back pain is reasonably controlled.  He does admit to having bilateral lower extremity weakness for the past week.  He has had difficulty ambulating and feels that both of his feet have been dragging.  He is also had decreased sensation from his sternum down to his legs bilaterally.  Denies any paresthesias.  He has noticed some constipation.  Denies any shortness of breath at this time.  On exam, he was noted to have 5 out of 5 strength in his upper extremities bilaterally.  He was noted to have 3 out of 5 strength in his lower extremities bilaterally.  Able to briefly lift his leg off the bed, but unable to hold his leg up.  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  If he is deemed to be an appropriate candidate for surgery, patient would want to undergo operative management.  He still wishes to receive treatment for his underlying cancer. -If patient is not a candidate for operative management, would likely need radiation therapy, which she is also currently agreeable to undergo -Bed request has been placed for [facility] -I have discussed his case with radiation oncology Dr. Tammi Klippel. 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  PET/CT from 10/2020 also mention possible small bowel involvement. -Previously had undergone chemotherapy and radiation at [facility] -He also received immunotherapy at that time, last treatments appear to be in 2020 -Patient has not followed up at several cancer centers since he is interested in HPV directed therapy -Reviewed of records show consultation reports from [facility], [place], [place] as well as [facility].  He has also been seen at [facility]. -Since he wants continued treatment if available, he would need to reestablish care at a center.  I have offered referral to [facility].  Unclear if he wishes to follow-up here.  This would need to be readdressed prior to discharge.  Left pleural effusion -Thoracentesis has been ordered for fluid analysis  Elevated D-dimer -Suspect is related to underlying cancer -CT angiogram chest is negative for PE venous Dopplers negative for DVT lower extremities  Hypothyroidism -Felt to be related to previous treatment with immunotherapy -On Synthroid  Hypokalemia -Replace  Goals of care -Patient says he wishes to be DNR -Palliative care consulted to further address goals of care -During my discussion with him today, it appears that he is still interested in pursuing further treatments

## 2021-04-02 NOTE — ED Notes (Signed)
Attending MD at bedside.

## 2021-04-02 NOTE — Progress Notes (Signed)
  Radiation Oncology         [phone]) [phone] ________________________________  Name: Bradley Oliver. Kolbe MRN: [id number]  Date: 04/01/2021  DOB: 06/29/1955  Holding Note:  I am on-call for radiation oncology this weekend.  I received a call from Dr. Roderic Palau notifying me about this patient admitted for spinal cord compression.  I was told that the ED was advised by neurosurgery to transfer to [facility] for consideration of urgent spinal cord decompression.  Given the patient's degree of cord compression and his Spine Instability Neoplasm Score (SINS) tallied below, I agree that surgical intervention should be considered first.  If the patient is not felt to be a surgical candidate, please text or page me.  Spine Instability Neoplastic Score (SINS): SINS Component Description Score  Location Junctional (Occ-C2, C7-T2, T11-L1, L5-S1) Mobile (C3-6, L2-4) Semirigid (T3-10) Rigid (S2-5) 3 2 1  0  Pain Yes Occasional, non-mechanical No 3 1 0  Bone Lesion Lytic Mixed Blastic 2 1 0  Alignment Subluxation/Translation De Novo deformity Normal 4 2 0  Vertebral Body >50% collapse <50% collapse No collapse >50% VB involved None of above 3 2 1  0  Posterolateral Involvment Bilateral Unilateral 3 1   Tallied Score from 6 Components: Stable Potentially Unstable Unstable  0-6 7-12 13-18   SINS Score: 16   Fisher CG, et al. A novel classification system for spinal instability in neoplastic disease: an evidence-based approach and expert consensus from the Spine Oncology Study Group. Spine  44(96):P5916-3, 2010     ________________________________  Sheral Apley. Tammi Klippel, M.D.  Pager [phone]  Text [phone]

## 2021-04-02 NOTE — ED Notes (Signed)
Pt still in MRI 

## 2021-04-02 NOTE — Plan of Care (Signed)

## 2021-04-02 NOTE — Procedures (Signed)
Patient presented to radiology for thoracentesis today.   Bilateral pleural space visualized with ultrasound.  No fluid collection for safe approach found.  Ultrasound images saved for documentation purpose.   Thoracentesis was NOT performed.    Armando Gang Fusaye Wachtel PA-C 04/02/2021 11:59 AM

## 2021-04-03 ENCOUNTER — Inpatient Hospital Stay (HOSPITAL_COMMUNITY): Payer: Medicare PPO

## 2021-04-03 DIAGNOSIS — Z7189 Other specified counseling: Secondary | ICD-10-CM

## 2021-04-03 DIAGNOSIS — C099 Malignant neoplasm of tonsil, unspecified: Secondary | ICD-10-CM | POA: Diagnosis not present

## 2021-04-03 DIAGNOSIS — C7949 Secondary malignant neoplasm of other parts of nervous system: Secondary | ICD-10-CM

## 2021-04-03 DIAGNOSIS — M8458XA Pathological fracture in neoplastic disease, other specified site, initial encounter for fracture: Secondary | ICD-10-CM | POA: Diagnosis not present

## 2021-04-03 DIAGNOSIS — R7989 Other specified abnormal findings of blood chemistry: Secondary | ICD-10-CM | POA: Diagnosis not present

## 2021-04-03 DIAGNOSIS — L8990 Pressure ulcer of unspecified site, unspecified stage: Secondary | ICD-10-CM | POA: Insufficient documentation

## 2021-04-03 DIAGNOSIS — G9520 Unspecified cord compression: Secondary | ICD-10-CM | POA: Diagnosis not present

## 2021-04-03 DIAGNOSIS — Z66 Do not resuscitate: Secondary | ICD-10-CM

## 2021-04-03 DIAGNOSIS — Z515 Encounter for palliative care: Secondary | ICD-10-CM

## 2021-04-03 DIAGNOSIS — D492 Neoplasm of unspecified behavior of bone, soft tissue, and skin: Secondary | ICD-10-CM | POA: Diagnosis present

## 2021-04-03 LAB — POCT I-STAT 7, (LYTES, BLD GAS, ICA,H+H)
Acid-base deficit: 5 mmol/L — ABNORMAL HIGH (ref 0.0–2.0)
Bicarbonate: 19.8 mmol/L — ABNORMAL LOW (ref 20.0–28.0)
Calcium, Ion: 1.2 mmol/L (ref 1.15–1.40)
HCT: 27 % — ABNORMAL LOW (ref 39.0–52.0)
Hemoglobin: 9.2 g/dL — ABNORMAL LOW (ref 13.0–17.0)
O2 Saturation: 100 %
Patient temperature: 35
Potassium: 4.2 mmol/L (ref 3.5–5.1)
Sodium: 136 mmol/L (ref 135–145)
TCO2: 21 mmol/L — ABNORMAL LOW (ref 22–32)
pCO2 arterial: 30.6 mmHg — ABNORMAL LOW (ref 32.0–48.0)
pH, Arterial: 7.41 (ref 7.350–7.450)
pO2, Arterial: 311 mmHg — ABNORMAL HIGH (ref 83.0–108.0)

## 2021-04-03 LAB — CBC
HCT: 37.1 % — ABNORMAL LOW (ref 39.0–52.0)
Hemoglobin: 12.8 g/dL — ABNORMAL LOW (ref 13.0–17.0)
MCH: 29.9 pg (ref 26.0–34.0)
MCHC: 34.5 g/dL (ref 30.0–36.0)
MCV: 86.7 fL (ref 80.0–100.0)
Platelets: 205 10*3/uL (ref 150–400)
RBC: 4.28 MIL/uL (ref 4.22–5.81)
RDW: 14.1 % (ref 11.5–15.5)
WBC: 10.6 10*3/uL — ABNORMAL HIGH (ref 4.0–10.5)
nRBC: 0 % (ref 0.0–0.2)

## 2021-04-03 LAB — CREATININE, SERUM
Creatinine, Ser: 0.92 mg/dL (ref 0.61–1.24)
GFR, Estimated: >60 mL/min (ref >60)

## 2021-04-03 MED ORDER — CEFAZOLIN SODIUM-DEXTROSE 2-4 GM/100ML-% IV SOLN
2.0000 g | INTRAVENOUS | Status: DC
  Filled 2021-04-03: qty 100

## 2021-04-03 MED ORDER — SODIUM CHLORIDE 0.9% FLUSH: 3.0000 mL | INTRAVENOUS | Status: DC | PRN

## 2021-04-03 MED ORDER — DOCUSATE SODIUM 100 MG PO CAPS: 100.0000 mg | ORAL_CAPSULE | Freq: Two times a day (BID) | ORAL | Status: DC

## 2021-04-03 MED ORDER — ARTIFICIAL TEARS OPHTHALMIC OINT
TOPICAL_OINTMENT | OPHTHALMIC | Status: AC
  Filled 2021-04-03: qty 3.5

## 2021-04-03 MED ORDER — ONDANSETRON HCL 4 MG/2ML IJ SOLN: 4.0000 mg | Freq: Once | INTRAMUSCULAR | Status: DC | PRN

## 2021-04-03 MED ORDER — SUCCINYLCHOLINE CHLORIDE 200 MG/10ML IV SOSY
PREFILLED_SYRINGE | INTRAVENOUS | Status: AC
  Filled 2021-04-03: qty 10

## 2021-04-03 MED ORDER — ONDANSETRON HCL 4 MG/2ML IJ SOLN
INTRAMUSCULAR | Status: AC
  Filled 2021-04-03: qty 2

## 2021-04-03 MED ORDER — SODIUM CHLORIDE 0.9% FLUSH
3.0000 mL | Freq: Two times a day (BID) | INTRAVENOUS | Status: DC
  Administered 2021-04-03 – 2021-04-06 (×6): 3 mL via INTRAVENOUS

## 2021-04-03 MED ORDER — FENTANYL CITRATE (PF) 100 MCG/2ML IJ SOLN
INTRAMUSCULAR | Status: AC
  Filled 2021-04-03: qty 2

## 2021-04-03 MED ORDER — EPHEDRINE 5 MG/ML INJ
INTRAVENOUS | Status: AC
  Filled 2021-04-03: qty 5

## 2021-04-03 MED ORDER — FENTANYL CITRATE (PF) 100 MCG/2ML IJ SOLN
25.0000 ug | INTRAMUSCULAR | Status: DC | PRN
  Administered 2021-04-03 (×2): 50 ug via INTRAVENOUS

## 2021-04-03 MED ORDER — EPHEDRINE SULFATE 50 MG/ML IJ SOLN
INTRAMUSCULAR | Status: DC | PRN
  Administered 2021-04-03: 5 mg via INTRAVENOUS

## 2021-04-03 MED ORDER — SUGAMMADEX SODIUM 200 MG/2ML IV SOLN
INTRAVENOUS | Status: DC | PRN
  Administered 2021-04-03: 200 mg via INTRAVENOUS

## 2021-04-03 MED ORDER — METHOCARBAMOL 500 MG PO TABS: 500.0000 mg | ORAL_TABLET | Freq: Four times a day (QID) | ORAL | Status: DC | PRN

## 2021-04-03 MED ORDER — MENTHOL 3 MG MT LOZG
1.0000 | LOZENGE | OROMUCOSAL | Status: DC | PRN
  Filled 2021-04-03: qty 9

## 2021-04-03 MED ORDER — SODIUM CHLORIDE 0.9 % IV SOLN: 250.0000 mL | INTRAVENOUS | Status: DC

## 2021-04-03 MED ORDER — ONDANSETRON HCL 4 MG PO TABS: 4.0000 mg | ORAL_TABLET | Freq: Four times a day (QID) | ORAL | Status: DC | PRN

## 2021-04-03 MED ORDER — CHLORHEXIDINE GLUCONATE CLOTH 2 % EX PADS
6.0000 | MEDICATED_PAD | Freq: Every day | CUTANEOUS | Status: DC
  Administered 2021-04-03 – 2021-04-09 (×7): 6 via TOPICAL

## 2021-04-03 MED ORDER — PHENOL 1.4 % MT LIQD: 1.0000 | OROMUCOSAL | Status: DC | PRN

## 2021-04-03 MED ORDER — CEFAZOLIN SODIUM-DEXTROSE 2-4 GM/100ML-% IV SOLN
2.0000 g | Freq: Three times a day (TID) | INTRAVENOUS | Status: AC
  Administered 2021-04-03 (×2): 2 g via INTRAVENOUS
  Filled 2021-04-03 (×2): qty 100

## 2021-04-03 MED ORDER — PHENYLEPHRINE 40 MCG/ML (10ML) SYRINGE FOR IV PUSH (FOR BLOOD PRESSURE SUPPORT)
PREFILLED_SYRINGE | INTRAVENOUS | Status: AC
  Filled 2021-04-03: qty 10

## 2021-04-03 MED ORDER — DEXTROSE-NACL 5-0.45 % IV SOLN: INTRAVENOUS | Status: DC

## 2021-04-03 MED ORDER — LIDOCAINE 2% (20 MG/ML) 5 ML SYRINGE
INTRAMUSCULAR | Status: AC
  Filled 2021-04-03: qty 5

## 2021-04-03 MED ORDER — HYDROMORPHONE HCL 1 MG/ML IJ SOLN: 0.5000 mg | INTRAMUSCULAR | Status: DC | PRN

## 2021-04-03 MED ORDER — OXYCODONE HCL 5 MG PO TABS
10.0000 mg | ORAL_TABLET | ORAL | Status: DC | PRN
  Filled 2021-04-03: qty 2

## 2021-04-03 MED ORDER — HEPARIN SODIUM (PORCINE) 5000 UNIT/ML IJ SOLN
5000.0000 [IU] | Freq: Two times a day (BID) | INTRAMUSCULAR | Status: DC
  Administered 2021-04-04 – 2021-04-09 (×11): 5000 [IU] via SUBCUTANEOUS
  Filled 2021-04-03 (×11): qty 1

## 2021-04-03 MED ORDER — CEFAZOLIN SODIUM 1 G IJ SOLR
INTRAMUSCULAR | Status: AC
  Filled 2021-04-03: qty 20

## 2021-04-03 MED ORDER — METHOCARBAMOL 1000 MG/10ML IJ SOLN
500.0000 mg | Freq: Four times a day (QID) | INTRAVENOUS | Status: DC | PRN
  Filled 2021-04-03 (×2): qty 5

## 2021-04-03 MED ORDER — ONDANSETRON HCL 4 MG/2ML IJ SOLN
INTRAMUSCULAR | Status: DC | PRN
  Administered 2021-04-03: 4 mg via INTRAVENOUS

## 2021-04-03 MED ORDER — ORAL CARE MOUTH RINSE
15.0000 mL | Freq: Two times a day (BID) | OROMUCOSAL | Status: DC
  Administered 2021-04-03 – 2021-04-09 (×12): 15 mL via OROMUCOSAL

## 2021-04-03 MED ORDER — ONDANSETRON HCL 4 MG/2ML IJ SOLN
4.0000 mg | Freq: Four times a day (QID) | INTRAMUSCULAR | Status: DC | PRN
  Filled 2021-04-03: qty 2

## 2021-04-03 MED ORDER — ALUM & MAG HYDROXIDE-SIMETH 200-200-20 MG/5ML PO SUSP: 30.0000 mL | Freq: Four times a day (QID) | ORAL | Status: DC | PRN

## 2021-04-03 MED ORDER — HYDROCODONE-ACETAMINOPHEN 10-325 MG PO TABS: 1.0000 | ORAL_TABLET | ORAL | Status: DC | PRN

## 2021-04-03 MED ORDER — ROCURONIUM BROMIDE 10 MG/ML (PF) SYRINGE
PREFILLED_SYRINGE | INTRAVENOUS | Status: AC
  Filled 2021-04-03: qty 10

## 2021-04-03 NOTE — Anesthesia Postprocedure Evaluation (Signed)
Anesthesia Post Note  Patient: Bradley Oliver. [name]  Procedure(s) Performed: THORACIC THREE-THORACIC SEVEN, INSTRUMENTATION AND FUSION, THORACIC FOUR-THORACIC SIX LAMINECTOMY, THORACIC FIVE TRANSPEDICULAR DECOMPRESSION (Spine Thoracic)     Patient location during evaluation: PACU Anesthesia Type: General Level of consciousness: awake and alert Pain management: pain level controlled Vital Signs Assessment: post-procedure vital signs reviewed and stable Respiratory status: spontaneous breathing, nonlabored ventilation and respiratory function stable Cardiovascular status: blood pressure returned to baseline and stable Postop Assessment: no apparent nausea or vomiting Anesthetic complications: no   No notable events documented.  Last Vitals:  Vitals:   04/03/21 0145 04/03/21 0200  BP: 112/73 112/65  Pulse: 67 69  Resp: (!) 9 12  Temp:    SpO2: 100% 100%    Last Pain:  Vitals:   04/02/21 2000  TempSrc: Oral  PainSc: 0-No pain                 Catalina Gravel

## 2021-04-03 NOTE — Progress Notes (Signed)
Inpatient Rehab Admissions Coordinator:   CIR consult received. I await therapy evaluations and recommendations in order to make a full assessment of CIR candidacy.   Clemens Catholic, [place], [place] Admissions Coordinator  [phone] ([place]) [phone] (office)

## 2021-04-03 NOTE — Progress Notes (Addendum)
[facility]) Hospital Liaison Note  Notified of patient/family request for [facility] Palliative services at home after discharge.  [facility] hospital liaison will follow patient for discharge disposition.  Please call with any hospice or outpatient palliative care related questions.  Thank you for the opportunity to participate in this patient's care.  Bobbie "Loren Racer, [place], [place] [phone]

## 2021-04-03 NOTE — Progress Notes (Signed)
eLink Physician-Brief Progress Note Patient Name: Bradley Oliver. 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  eICU Interventions  New Patient Evaluation.        Bradley Oliver 04/03/2021, 2:33 AM

## 2021-04-03 NOTE — Consult Note (Signed)
Consultation Note Date: 04/03/2021   Patient Name: Bradley Oliver  DOB: 17-Oct-1955  MRN: [id number]  Age / Sex: 65 y.o., male  PCP: Patient, [name] Referring Physician: Dawley, Theodoro Doing, DO  Reason for Consultation: Establishing goals of care  HPI/Patient Profile: 65 y.o. 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 CTA chest was negative for PE; showed medial left lung mass extending to mediastinum. MRI of lumbar spine showed 8 mm enhancing lesion within the inferior aspect of L5 vertebral body suspicious for metastatic lesion.  MRI of thoracic spine showed large destructive and infiltrative metastatic deposit centered at the left paraspinous region with involvement of T4-T7 vertebral bodies.  Associated epidural extension with severe spinal stenosis, cord compression and cord signal changes at the level of T4-5 through T6-7.  Metastatic lesion extends to involve the adjacent posterior medial aspect of the left lung as well as the left adjacent mediastinum and left hilar/perihilar region.  Associated pathologic fracture of T5.  1 cm enhancing lesion within the T11 vertebral body suspicious for metastatic lesion. Patient underwent decompression T4-T6 with T5 transverse decompression, T3-T7 pedicle screw instrumentation. Patient on scheduled IV decadron. Patient also with chronic dysphagia however he declined MBS. PMT consulted to discuss [place].  Clinical Assessment and Goals of Care: I have reviewed medical records including EPIC notes, labs and imaging, received report from RN, assessed the patient and then met with patient to discuss diagnosis prognosis, GOC, EOL wishes, disposition and options.  I  introduced Palliative Medicine as specialized medical care for people living with serious illness. It focuses on providing relief from the symptoms and stress of a serious illness. The goal is to improve quality of life for both the patient and the family.  We discussed a brief life review of the patient. 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   As far as functional and nutritional status, he tells me prior to decline he experienced over the past week he was ambulatory and able to care for himself independently. He tells me he has had a poor appetite for a while.   We discuss his chronic dysphagia - he is not too concerned about this. Tells me he has been managing this for years. Tells me he know what he can and cannot tolerate. He knows how to prevent aspiration - tells me he has never had aspiration pneumonia. We discuss concern of worsening dysphagia. He does not see the benefit in swallow evaluation/MBS. He is frustrated with hospital food and feels it does not contain anything that will provide him sufficient nourishment. He also declines Boost - tells me he has tried this before and did not like it. We discuss concern about poor nutrition in healing process.   We discussed patient's current illness and what it means in the larger context of patient's on-going co-morbidities.  Natural disease trajectory and expectations at EOL were discussed. Patient has good understanding of his disease and progression. He does tell me that no one has shared his prognosis with him though he does suspect he is nearing end of life.   I attempted to elicit values and goals of care important to the patient.  Patient tells me he continues to be  interested in cancer treatment options offered to him.   The difference between aggressive medical intervention and comfort care was considered in light of the patient's goals of care. Patient is  not interested in a comfort approach at this time.   Discussed with patient the importance of continued conversation with family and the medical providers regarding overall plan of care and treatment options, ensuring decisions are within the context of the patient's values and GOCs.    We discussed his symptoms - he has been declining pain medication as  he does not want it to mask any of his symptoms. He tells me he has a high pain tolerance. He does share that the pain he is currently experiencing is interfering with his ability to work with therapies.   We briefly discussed that he is likely eligible for hospice support when he reaches a point that he does not feel like continuing with treatment options offered is providing what he would consider good quality of life. We discuss a shift in focus to his comfort and quality of life.   I offered outpatient palliative follow up to him and he is agreeable.   Patient does have HCPOA document naming his brother Roderic Palau as Chauncey Reading and his sister as secondary.   Questions and concerns were addressed. The family was encouraged to call with questions or concerns.   Primary Decision Maker PATIENT    SUMMARY OF RECOMMENDATIONS   At this time patient remains interested in cancer treatment options offered to him Will refer to outpatient palliative to follow Patient educated on concerns about his care moving forward, he has good understanding of situation  Code Status/Advance Care Planning: DNR  Symptom Management:  Declines pain medication  Primary Diagnoses: Present on Admission:  Cord compression ([place])  Squamous cell carcinoma of right tonsil [facility])  Thoracic spine tumor   I have reviewed the medical record, interviewed the patient and family, and examined the patient. The following aspects are pertinent.  Past Medical History:  Diagnosis Date   Blood in stool    Cancer ([place]) 08/05/14   right tonsil, P16 positive   Social History    Socioeconomic History   Marital status: Single    Spouse name: Not on file   Number of children: Not on file   Years of education: 17   Highest education level: Master's degree (e.g., MA, MS, MEng, MEd, MSW, MBA)  Occupational History   Not on file  Tobacco Use   Smoking status: Former   Smokeless tobacco: Never   Tobacco comments:    smoked for short time as teenager  Substance and Sexual Activity   Alcohol use: Not Currently   Drug use: Never   Sexual activity: Not Currently  Other Topics Concern   Not on file  Social History Narrative   Not on file   Social Determinants of Health   Financial Resource Strain: Not on file  Food Insecurity: Not on file  Transportation Needs: Not on file  Physical Activity: Not on file  Stress: Not on file  Social Connections: Not on file   Family History  Problem Relation Age of Onset   Hypertension Mother    Diabetes Father    Diabetes Paternal Grandmother    Diabetes Paternal Grandfather    Scheduled Meds:  sodium chloride   Intravenous Once   Chlorhexidine Gluconate Cloth  6 each Topical Daily   dexamethasone (DECADRON) injection  4 mg Intravenous Q6H   docusate sodium  100 mg  Oral BID   [START ON 04/04/2021] heparin injection (subcutaneous)  5,000 Units Subcutaneous Q12H   levothyroxine  50 mcg Oral Q0600   mouth rinse  15 mL Mouth Rinse BID   potassium chloride  40 mEq Oral Once   sodium chloride flush  3 mL Intravenous Q12H   Continuous Infusions:  sodium chloride      ceFAZolin (ANCEF) IV      ceFAZolin (ANCEF) IV 2 g (04/03/21 0608)   dextrose 5 % and 0.45% NaCl 75 mL/hr at 04/03/21 1246   methocarbamol (ROBAXIN) IV     PRN Meds:.acetaminophen **OR** acetaminophen, alum & mag hydroxide-simeth, HYDROcodone-acetaminophen, HYDROmorphone (DILAUDID) injection, menthol-cetylpyridinium **OR** phenol, methocarbamol **OR** methocarbamol (ROBAXIN) IV, ondansetron **OR** ondansetron (ZOFRAN) IV, oxyCODONE, polyethylene glycol,  sodium chloride flush No Known Allergies Review of Systems  Constitutional:  Positive for activity change and appetite change.  HENT:  Positive for trouble swallowing and voice change.    Physical Exam Constitutional:      General: He is not in acute distress.    Comments: frail  Cardiovascular:     Rate and Rhythm: Normal rate.  Pulmonary:     Effort: Pulmonary effort is normal.  Neurological:     Mental Status: He is alert and oriented to person, place, and time.    Vital Signs: BP 96/64   Pulse 84   Temp 98.1 F (36.7 C) (Oral)   Resp 20   Ht 5' 10"  (1.778 m)   Wt 50 kg   SpO2 95%   BMI 15.82 kg/m  Pain Scale: 0-10 POSS *See Group Information*: 1-Acceptable,Awake and alert Pain Score: 3    SpO2: SpO2: 95 % O2 Device:SpO2: 95 % O2 Flow Rate: .   IO: Intake/output summary:  Intake/Output Summary (Last 24 hours) at 04/03/2021 1455 Last data filed at 04/03/2021 1256 Gross per 24 hour  Intake 3080 ml  Output 1835 ml  Net 1245 ml    LBM: Last BM Date:  (PTA) Baseline Weight: Weight: 49.9 kg Most recent weight: Weight: 50 kg     Palliative Assessment/Data: PPS 20% at this point d/t PO intake     Time Total:  80 minutes Greater than 50%  of this time was spent counseling and coordinating care related to the above assessment and plan.  Juel Burrow, DNP, AGNP-C Palliative Medicine Team [phone] Pager: [phone]

## 2021-04-03 NOTE — Progress Notes (Signed)
Patient ID: Bradley Oliver. Wadding, male   DOB: Aug 02, 1955, 65 y.o.   MRN: [id number] BP 115/68   Pulse 78   Temp 98.1 F (36.7 C) (Oral)   Resp (!) 22   Ht 5\' 10"  (1.778 m)   Wt 50 kg   SpO2 96%   BMI 15.82 kg/m  Alert and oriented x 4, moving all extremities Wound is clean, dry, no signs of infection Will remove foley Doing well overall

## 2021-04-03 NOTE — Evaluation (Signed)
Occupational Therapy Evaluation Patient Details Name: Bradley Oliver. Shepherd MRN: [id number] DOB: January 12, 1956 Today's Date: 04/03/2021   History of Present Illness 65 y/o male presented to [facility] ED on 04/01/21 following fall due to weakness/numbness of bilateral LE weakness. MRI of C-spine no evidence of metastatic disease. MRI T-spine showed large destructive and infiltrative metastatic deposit centered at the L paraspinous region involvement of T4-T7 vertebral bodies, associated epidural extension with severe spinal stenosis, cord compression and cord signal changes at levels of T4-7, metatstatic lesions of L lung, associated pathologic fx of T5, and T11 vertebral body enhancing lesion. MRI L-spine showed 8 mm enhancing lesion within inferior aspect of L5 vertebral body suspicious for metastatic lesion. S/p T3-7 fusion, T4-6 laminectomy, T5 transpedicular decompresion on 11/11. 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 Pt demonstrates bed level evaluation with total +2 max A this session. Pt reports pain in R scapula even supine so placed on wedge of comfort. Pt very agreeable this session to all therapy. Pt tangential at times but redirected to focus of session. Pt's whole motivation is to have [facility] study participation. Palliative consult pending. Recommend focus some focused discussion on environmental needs such as hospital bed instead of cough sleeping, BSC due to limited ability to ambulated to commode , w/c needs due to fatigue / fall risk and aide if possible to help with hygiene. Pt was very clear he did not want to be a burden to his sister and mother. Pt however also as the expectation to drive upon exiting the hospital.  Patient will benefit from skilled OT acutely  to increase independence and safety with ADLS to allow discharge SNF ( motivated to go home at this time).       Recommendations for follow up therapy are one component of a multi-disciplinary discharge planning process, led by the attending physician.  Recommendations may be updated based on patient status, additional functional criteria and insurance authorization.   Follow Up Recommendations  Skilled nursing-short term rehab (<3 hours/day) (pt likely to request to d/c home and activity tolerance limited for CIR at this time)    Assistance Recommended at Discharge Intermittent Supervision/Assistance  Functional Status Assessment  Patient has had a recent decline in their functional status and demonstrates the ability to make significant improvements in function in a reasonable and predictable amount of time.  Equipment Recommendations  BSC/3in1;Wheelchair (measurements OT);Wheelchair cushion (measurements OT);Hospital bed (pt agreeable to max out home equipment ; aide would be helpful. pt also states he does not want to burden his family)    Recommendations for Other Services Other (comment) (palliative- pt plans to go to [facility] clinical study)     Precautions / Restrictions Precautions Precautions: Back Precaution Comments: hemovac aline foley      Mobility Bed Mobility Overal bed mobility: Needs Assistance Bed Mobility: Supine to Sit;Sit to Supine     Supine to sit: +2 for physical assistance;Max assist Sit to supine: +2 for physical assistance;Max assist   General bed mobility comments: pt requires HOB increased and helicopter method to eOb. pt static sitting eob for > 10 minutes max (A) to total. pt hugging pillow for support anteirorly. pt able to initiate bil le movement in static sitting. pt requires helicopter back to supein with pad. pad used throughout session to cradle  patient as he has severe pain in the R scapula    Transfers                   General  transfer comment: defer next session      Balance Overall balance assessment: Needs assistance Sitting-balance support: No upper extremity supported;Feet supported Sitting balance-Leahy Scale: Poor                                     ADL either performed or assessed with clinical judgement   ADL Overall ADL's : Needs assistance/impaired   Eating/Feeding Details (indicate cue type and reason): declined food in front of him Grooming: Moderate assistance;Bed level   Upper Body Bathing: Maximal assistance   Lower Body Bathing: Maximal assistance   Upper Body Dressing : Maximal assistance   Lower Body Dressing: Maximal assistance                 General ADL Comments: eob only static siting with reliance on support     Vision Baseline Vision/History: 0 No visual deficits       Perception     Praxis      Pertinent Vitals/Pain Pain Assessment: Faces Faces Pain Scale: Hurts even more Pain Location: back Pain Descriptors / Indicators: Operative site guarding Pain Intervention(s): Limited activity within patient's tolerance;Monitored during session;Premedicated before session;Repositioned     Hand Dominance Right   Extremity/Trunk Assessment Upper Extremity Assessment Upper Extremity Assessment: Generalized weakness;LUE deficits/detail LUE Deficits / Details: self limiting due to ALine and concern with line   Lower Extremity Assessment Lower Extremity Assessment: Defer to PT evaluation;LLE deficits/detail LLE Deficits / Details: decreased coordination compared to R LE.   Cervical / Trunk Assessment Cervical / Trunk Assessment: Back Surgery   Communication Communication Communication: No difficulties   Cognition Arousal/Alertness: Awake/alert Behavior During Therapy: Anxious Overall Cognitive Status: Impaired/Different from baseline Area of Impairment: Safety/judgement;Awareness                         Safety/Judgement: Decreased  awareness of deficits;Decreased awareness of safety Awareness: Emergent   General Comments: pt reports that he can drive himself to [facility] clinical trial because he will use on foot for either pedal. pt with sensory changes and motor planning deficits observed this session. pt is unabel to sit EOB without support.     General Comments  VSS    Exercises Exercises: Other exercises Other Exercises Other Exercises: ankle pumps, knee flexion / extension hip flexion Other Exercises: educated on bracign with pillow to cough with good return demo   Shoulder Instructions      Home Living Family/patient expects to be discharged to:: Private residence Living Arrangements: Parent;Other relatives                           Home Equipment: None   Additional Comments: sleeps on the couch down stairs.      Prior Functioning/Environment Prior Level of Function : Driving             Mobility Comments: reports using nothing until he fell ADLs Comments: indep-- but reports using a urinal on the couch so question (A) needing to increase with pt minimizing some recent needs        OT Problem List: Decreased strength;Decreased activity tolerance;Impaired balance (sitting and/or standing);Decreased coordination;Decreased cognition;Decreased safety awareness;Decreased  knowledge of use of DME or AE;Decreased knowledge of precautions;Pain;Cardiopulmonary status limiting activity;Impaired sensation      OT Treatment/Interventions: Self-care/ADL training;Therapeutic exercise;Neuromuscular education;Energy conservation;DME and/or AE instruction;Manual therapy;Modalities;Therapeutic activities;Cognitive remediation/compensation;Patient/family education;Balance training    OT Goals(Current goals can be found in the care plan section) Acute Rehab OT Goals Patient Stated Goal: to go to [facility] in his [place] driving himself to a clincial trial . he would like the doctors here to communicate with [facility] on  his behalf to help coordinate his continue progres toward this study OT Goal Formulation: With patient Potential to Achieve Goals: Fair  OT Frequency: Min 2X/week   Barriers to D/C:    reports sister has a stimulator in back and limited in ability to help with anything that involves physical (A). has elderly mother to help as well       Co-evaluation PT/OT/SLP Co-Evaluation/Treatment: Yes Reason for Co-Treatment: To address functional/ADL transfers;For patient/therapist safety;Necessary to address cognition/behavior during functional activity;Complexity of the patient's impairments (multi-system involvement)   OT goals addressed during session: ADL's and self-care;Proper use of Adaptive equipment and DME;Strengthening/ROM      AM-PAC OT "6 Clicks" Daily Activity     Outcome Measure Help from another person eating meals?: A Lot Help from another person taking care of personal grooming?: A Lot Help from another person toileting, which includes using toliet, bedpan, or urinal?: A Lot Help from another person bathing (including washing, rinsing, drying)?: A Lot Help from another person to put on and taking off regular upper body clothing?: A Lot Help from another person to put on and taking off regular lower body clothing?: A Lot 6 Click Score: 12   End of Session Nurse Communication: Mobility status;Precautions;Need for lift equipment  Activity Tolerance: Patient tolerated treatment well Patient left: in bed;with call bell/phone within reach;with bed alarm set  OT Visit Diagnosis: Unsteadiness on feet (R26.81);Muscle weakness (generalized) (M62.81);Pain                Time: 2876-8115 OT Time Calculation (min): 34 min Charges:  OT General Charges $OT Visit: 1 Visit OT Evaluation $OT Eval Moderate Complexity: 1 Mod   Bradley Oliver, OTR/L  Acute Rehabilitation Services Pager: [phone] Office: [phone] .   Jeri Modena 04/03/2021, 1:39 PM

## 2021-04-03 NOTE — Progress Notes (Signed)
[facility]  PROGRESS NOTE  Bradley Oliver. 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  D-dimer was elevated, CTA chest was negative for PE.  Showed medial left lung mass extending to mediastinum.  MRI of lumbar spine showed 8 mm enhancing lesion within the inferior aspect of L5 vertebral body suspicious for metastatic lesion.  MRI of thoracic spine showed large destructive and infiltrative metastatic deposit centered at the left paraspinous region with involvement of T4-T7 vertebral bodies.  Associated epidural extension with severe spinal stenosis, cord compression and cord signal changes at the level of T4-5 through T6-7.     Metastatic lesion extends to involve the adjacent posterior medial aspect of the left lung as well as the left adjacent mediastinum and left hilar/perihilar region.  Associated pathologic fracture of T5.  1 cm enhancing lesion within the T11 vertebral body suspicious for metastatic lesion.  Neurosurgery consulted by ED physician.  Bradley Oliver was transferred to [facility], underwent decompression T4-T6 with T5 transverse decompression, T3-T7 pedicle screw instrumentation.  Subjective   Bradley Oliver seen, s/p core decompression.  Able to feel sensations in lower extremities and also moving lower extremities.   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, HPV positive -With metastasis to lung and spine -PET/CT from 10/2020 also mention possible small bowel involvement -Previously underwent chemotherapy and radiation at [facility] -He also received immunotherapy at that time -Bradley Oliver has been to multiple centers for treatment of HPV directed therapy -Oncology has been consulted  Left pleural effusion -Seen on CTA chest -Thoracentesis ordered -We will follow labs for fluid analysis  Elevated D-dimer -Likely from underlying cancer -CTA chest negative for PE -Venous duplex of lower extremities negative for DVT  Hypothyroidism -Likely from previous treatment with monotherapy -Continue Synthroid       Scheduled medications:    sodium chloride   Intravenous Once   Chlorhexidine Gluconate Cloth  6 each Topical Daily   dexamethasone (DECADRON) injection  4 mg Intravenous Q6H   docusate sodium  100 mg Oral BID   [START ON 04/04/2021] heparin injection (subcutaneous)  5,000 Units Subcutaneous Q12H   levothyroxine  50 mcg Oral Q0600   mouth rinse  15 mL Mouth Rinse BID   potassium chloride  40 mEq Oral Once   sodium chloride flush  3 mL Intravenous Q12H     Data Reviewed:   CBG:  Recent Labs  Lab 04/01/21 2002  GLUCAP 104*    SpO2: 95 %    Vitals:   04/03/21 1100 04/03/21 1200 04/03/21 1257 04/03/21 1300  BP: (!) 138/94 115/68  114/73  Pulse: 90 78  79  Resp: (!) 25 (!) 22  18  Temp:   98.1 F (36.7 C)   TempSrc:   Oral   SpO2: 96% 96%  95%  Weight:      Height:         Intake/Output Summary (Last 24 hours) at 04/03/2021 1410 Last data filed at 04/03/2021 1256 Gross per 24 hour  Intake 3080 ml  Output 1835 ml  Net 1245 ml    11/10 1901 - 11/12 0700 In: 2980 [I.V.:2300] Out: 1245 [Urine:825; Drains:20]  Filed Weights   04/01/21 1652  04/03/21 0200  Weight: 49.9 kg 50 kg    Data Reviewed: Basic Metabolic Panel: Recent Labs  Lab 04/01/21 1912 04/02/21 2341 04/03/21 0225  NA 137 136  --   K 3.4* 4.2  --   CL 101  --   --   CO2 27  --   --   GLUCOSE 97  --   --   BUN 20  --   --   CREATININE 0.72  --  0.92  CALCIUM 9.3  --   --    Liver Function Tests: Recent Labs  Lab 04/01/21 1912  AST 27  ALT 11  ALKPHOS 82  BILITOT 1.2  PROT 7.2  ALBUMIN 4.2   No results for input(s): LIPASE, AMYLASE in the last 168 hours. No results for input(s): AMMONIA in the last 168 hours. CBC: Recent Labs  Lab 04/01/21 1912 04/02/21 2341 04/03/21 0225  WBC 6.6  --  10.6*  NEUTROABS 5.4  --   --   HGB 14.0 9.2* 12.8*  HCT 40.9 27.0* 37.1*  MCV 88.1  --  86.7  PLT 274  --  205   Cardiac Enzymes: No results for input(s): CKTOTAL, CKMB, CKMBINDEX, TROPONINI in the last 168 hours. BNP (last 3 results) No results for input(s): BNP in the last 8760 hours.  ProBNP (last 3 results) No results for input(s): PROBNP in the last 8760 hours.  CBG: Recent Labs  Lab 04/01/21 2002  GLUCAP 104*       Radiology Reports  CT Angio Chest PE W and/or Wo Contrast  Result Date: 04/02/2021 CLINICAL DATA:  Weakness, T4-6 tumor, history of right tonsillar cancer, elevated D-dimer, evaluate for PE EXAM: CT ANGIOGRAPHY CHEST WITH CONTRAST TECHNIQUE: Multidetector CT imaging of the chest was performed using the standard protocol during bolus administration of intravenous contrast. Multiplanar CT image reconstructions and MIPs were obtained to evaluate the vascular anatomy. CONTRAST:  36mL OMNIPAQUE IOHEXOL 350 MG/ML SOLN COMPARISON:  None. FINDINGS: Cardiovascular: Satisfactory opacification of the bilateral pulmonary arteries to the lobar level. Narrowing of the left main pulmonary artery due to tumor. No evidence of pulmonary embolism. Although not tailored for evaluation of the thoracic aorta, there is no evidence thoracic aortic  aneurysm. Mild atherosclerotic calcifications. The heart is normal in size.  No pericardial effusion. Mediastinum/Nodes: Tumor along the anterior mediastinum/AP window and extending to the medial lung. This will be described below. Visualized thyroid is unremarkable. Lungs/Pleura: Mass involving the medial aspect of the left upper lobe and superior segment left lower lobe, extending to the mediastinum, measuring 14.1 x 4.7 cm (series 6/image 93). Moderate left pleural effusion, partially loculated, with associated apical capping. Associated left lower lobe opacity, likely compressive atelectasis. Trace right pleural effusion. 9 mm nodule in the superior segment right lower lobe (series 7/image 36). No pneumothorax. Upper Abdomen: Visualized upper abdomen is grossly unremarkable. Musculoskeletal: Moderate compression fracture at T5, pathologic. Total spine MRI has been performed and further evaluation will be deferred to that study. Otherwise, no focal osseous lesions. Review of the MIP images confirms the above findings. IMPRESSION: No evidence of  pulmonary embolism. 14.1 x 4.7 cm mass involving the medial left lung and extending to the mediastinum, as above. Moderate left pleural effusion, partially loculated, with apical capping. Trace right pleural effusion. Moderate pathologic compression fracture at T5. Additional spinal involvement will be described on dedicated total spine MR, reported separately. Aortic Atherosclerosis (ICD10-I70.0). Electronically Signed   By: Julian Hy M.D.   On: 04/02/2021 01:31   MR Cervical Spine W or Wo Contrast  Result Date: 04/02/2021 CLINICAL DATA:  Initial evaluation for metastatic disease evaluation. EXAM: MRI CERVICAL SPINE WITHOUT AND WITH CONTRAST TECHNIQUE: Multiplanar and multiecho pulse sequences of the cervical spine, to include the craniocervical junction and cervicothoracic junction, were obtained without and with intravenous contrast. CONTRAST:  5.58mL  GADAVIST GADOBUTROL 1 MMOL/ML IV SOLN COMPARISON:  None. FINDINGS: Alignment: Physiologic with preservation of the normal cervical lordosis. No listhesis. Vertebrae: Vertebral body height maintained without acute or chronic fracture. Bone marrow signal intensity within normal limits. No discrete osseous lesions or evidence for metastatic disease. No abnormal edema or enhancement. Cord: Normal signal morphology. Posterior Fossa, vertebral arteries, paraspinal tissues: Visualized brain and posterior fossa within normal limits. Craniocervical junction normal. Paraspinous soft tissues within normal limits. Normal flow voids seen within the vertebral arteries bilaterally. Disc levels: C2-C3: Right paracentral disc protrusion indents the right ventral thecal sac. No significant spinal stenosis or cord impingement. Foramina remain patent. C3-C4: Right paracentral disc protrusion indents the right ventral thecal sac. No significant spinal stenosis or cord impingement. Foramina remain patent. C4-C5: Mild degenerative intervertebral disc space narrowing. Superimposed right paracentral disc protrusion indents the ventral thecal sac, contacting and minimally flattening the ventral cord. Thecal sac remains widely patent without significant spinal stenosis. Bilateral uncovertebral spurring with resultant mild right C5 foraminal stenosis. Left neural foramen remains patent. C5-C6: Mild degenerative intervertebral disc space narrowing. Right eccentric disc bulge with associated right greater than left uncovertebral spurring. Broad posterior disc osteophyte mildly flattens the ventral thecal sac without significant spinal stenosis. Moderate right C6 foraminal narrowing. Left neural foramina remains patent. C6-C7: Mild degenerative intervertebral disc space narrowing. Right eccentric disc osteophyte complex mildly flattens the ventral thecal sac without significant spinal stenosis. Mild to moderate right C7 foraminal narrowing. Left  neural foramen remains patent. C7-T1: Minimal disc bulge. Mild facet hypertrophy. No spinal stenosis. Foramina remain patent. IMPRESSION: 1. No evidence for metastatic disease within the cervical spine. 2. Small right paracentral disc protrusions at C2-3 through C4-5 without significant spinal stenosis. 3. Right eccentric disc osteophyte complexes at C5-6 and C6-7 with resultant mild to moderate right C6 and C7 foraminal stenosis as above. Electronically Signed   By: Jeannine Boga M.D.   On: 04/02/2021 01:05   MR THORACIC SPINE W WO CONTRAST  Result Date: 04/02/2021 CLINICAL DATA:  Initial metastatic evaluation. EXAM: MRI THORACIC WITHOUT AND WITH CONTRAST TECHNIQUE: Multiplanar and multiecho pulse sequences of the thoracic spine were obtained without and with intravenous contrast. CONTRAST:  5.38mL GADAVIST GADOBUTROL 1 MMOL/ML IV SOLN COMPARISON:  None available. FINDINGS: Alignment: Dextroscoliosis. Focal kyphotic angulation of the thoracic spine at the level of T5. No listhesis. Vertebrae: Large contiguous infiltrative and destructive metastatic deposit seen involving the upper-midthoracic spine. Lesion involves the T4 through T7 vertebral bodies, and is centered at the left paraspinous region. Although measurements are somewhat difficult, lesion measures approximately 7.7 x 8.4 x 10.9 cm in greatest dimensions (AP by transverse by craniocaudad). Lesion involves the left hilar/perihilar region, tracking posteriorly along the posteromedial left lung, involving the adjacent left  mediastinum and partially encasing the aorta. Direct extension of this mass into the adjacent posterior ribs and T4 through T7 vertebral bodies, with involvement of their posterior elements at several levels. There is an associated pathologic fracture of T5 with approximately 60% height loss. Epidural extension of tumor to involve the spinal canal, extending from T3 through T7. This is most pronounced at the level of T5 where  there is circumferential epidural involvement. Associated severe spinal stenosis at the level of T5 with cord compression and associated cord signal changes (series 17, image 8). Thecal sac measures 4 mm in AP diameter at its most narrow point. Tumor also extends to involve the left neural foramina at T4-5 through T6-7, with probable early extension into the left T3-4 and T7-8 foramina. Involvement of the right T4-5 and T6-7 neural foramina noted as well. There is an additional 1 cm T1 hypointense enhancing lesion within the T11 vertebral body, indeterminate, but could reflect an additional metastatic lesion (series 19, image 7). Otherwise, no other metastatic disease seen within the thoracic spine. Multiple scattered benign hemangiomata noted. No other acute or chronic fracture. Cord: Epidural tumor with associated severe spinal stenosis and cord signal changes at T4-5 through T6-7 as above, most pronounced at the T5 level. Otherwise, signal intensity within the thoracic cord is within normal limits. No other epidural involvement. Paraspinal and other soft tissues: Metastatic lesion involving the left paraspinous and posteromedial left lung at T4 through T7 as above. Associated atelectasis with irregular and partially loculated left pleural effusion partially visualized. Direct extension of tumor to involve the left posterior soft tissues of the left mid back noted as well. Disc levels: No significant underlying disc pathology for age. No other significant stenosis or impingement. IMPRESSION: 1. Large destructive and infiltrative metastatic deposit centered at the left paraspinous region, with involvement of the T4 through T7 vertebral bodies as above. Associated epidural extension with severe spinal stenosis, cord compression, and cord signal changes at the levels of T4-5 through T6-7. 2. Metastatic lesion extends to involve the adjacent posteromedial aspect of the left lung as well as the left adjacent  mediastinum and left hilar/perihilar region, also seen on corresponding CTA of the chest. 3. Associated pathologic fracture of T5 with up to 60% height loss. 4. 1 cm enhancing lesion within the T11 vertebral body, indeterminate, but could reflect an additional metastatic lesion. Attention at follow-up recommended. Critical Value/emergent results were called by telephone at the time of interpretation on 04/02/2021 at 1:32 am to provider Dr. Florina Ou , who verbally acknowledged these results. Electronically Signed   By: Jeannine Boga M.D.   On: 04/02/2021 01:41   MR Lumbar Spine W Wo Contrast  Result Date: 04/02/2021 CLINICAL DATA:  Initial evaluation for metastatic evaluation. EXAM: MRI LUMBAR SPINE WITHOUT AND WITH CONTRAST TECHNIQUE: Multiplanar and multiecho pulse sequences of the lumbar spine were obtained without and with intravenous contrast. CONTRAST:  5.25mL GADAVIST GADOBUTROL 1 MMOL/ML IV SOLN COMPARISON:  None available. FINDINGS: Segmentation: Standard. Lowest well-formed disc space labeled the L5-S1 level. Alignment: Physiologic with preservation of the normal lumbar lordosis. No listhesis. Vertebrae: Vertebral body height maintained without acute or chronic fracture. Prominent benign hemangioma noted within the T12 vertebral body. There is a 8 mm T1 hypointense enhancing lesion within the inferior aspect of the L5 vertebral body, nonspecific, but could reflect a small metastatic lesion (series 23, image 6). No other evidence for metastatic disease seen within the lumbar spine. Conus medullaris and cauda equina: Conus extends to  the L1 level. Conus and cauda equina appear normal. Paraspinal and other soft tissues: Mild diffuse edema within the lower posterior paraspinous musculature, nonspecific, but could reflect muscular injury and/or strain. Soft tissues demonstrate no other acute finding. Prominent distension of the partially visualized urinary bladder. Visualized visceral structures  otherwise unremarkable. Disc levels: L1-2:  Negative interspace.  Mild facet hypertrophy.  No stenosis. L2-3:  Negative interspace.  Mild facet hypertrophy.  No stenosis. L3-4:  Negative interspace.  Mild facet hypertrophy.  No stenosis. L4-5: Disc desiccation with minimal disc bulge. Superimposed tiny central disc protrusion with annular fissure. Mild facet hypertrophy. Mild narrowing of the lateral recesses. Central canal remains patent. No foraminal stenosis. L5-S1: Degenerative intervertebral disc space narrowing with disc desiccation and mild diffuse disc bulge. Mild reactive endplate spurring. Mild facet hypertrophy. No significant spinal stenosis. Foramina remain patent. IMPRESSION: 1. 8 mm enhancing lesion within the inferior aspect of the L5 vertebral body, nonspecific, but could reflect a small metastatic lesion. Attention at follow-up recommended. 2. No other evidence for metastatic disease within the lumbar spine. 3. Mild diffuse edema within the lower posterior paraspinous musculature, nonspecific, but could reflect muscular injury and/or strain. 4. Prominent distension of the visualized urinary bladder. Query urinary retention. 5. Mild for age spondylosis without significant stenosis. Electronically Signed   By: Jeannine Boga M.D.   On: 04/02/2021 01:51   [place] CHEST (PLEURAL EFFUSION)  Result Date: 04/02/2021 CLINICAL DATA:  Pleural effusion. EXAM: CHEST ULTRASOUND COMPARISON:  CTA Chest earlier same day FINDINGS: Small left pleural effusion.  No discernible right effusion. IMPRESSION: Small left pleural effusion. Inadequate volume for safe image guided thoracentesis. Electronically Signed   By: Misty Stanley M.D.   On: 04/02/2021 12:23   DG Thoracic Spine 1 View  Result Date: 04/03/2021 CLINICAL DATA:  T3-7 fusion EXAM: OPERATIVE THORACIC SPINE 1 VIEW(S) COMPARISON:  None. FLUOROSCOPY TIME:  36 seconds FINDINGS: Intraoperative fluoroscopic image during T3-7 fusion with pedicle screws.  IMPRESSION: Intraoperative fluoroscopic images, as above. Electronically Signed   By: Julian Hy M.D.   On: 04/03/2021 02:56   DG Chest Portable 1 View  Result Date: 04/01/2021 CLINICAL DATA:  Chest tightness, tumor at T4-6 EXAM: PORTABLE CHEST 1 VIEW COMPARISON:  11/09/2017 FINDINGS: Left perihilar/mediastinal mass, progressive. Associated apical pleural thickening versus fluid, new. Right lung is clear.  No pneumothorax. The heart is normal in size. IMPRESSION: Left perihilar/mediastinal mass in this Bradley Oliver with known T4-6 tumor, progressive. Associated apical pleural thickening/versus fluid, new. Electronically Signed   By: Julian Hy M.D.   On: 04/01/2021 20:24   DG C-Arm 1-60 Min-No Report  Result Date: 04/03/2021 Fluoroscopy was utilized by the requesting physician.  No radiographic interpretation.   DG C-Arm 1-60 Min-No Report  Result Date: 04/03/2021 Fluoroscopy was utilized by the requesting physician.  No radiographic interpretation.   VAS [place] LOWER EXTREMITY VENOUS (DVT)  Result Date: 04/02/2021  Lower Venous DVT Study Bradley Oliver Name:  RIGOBERTO REPASS. [facility]  Date of Exam:   04/02/2021 Medical Rec #: [id number]          Accession #:    [id number] Date of Birth: Dec 17, 1955          Bradley Oliver Gender: M Bradley Oliver Age:   59 years Exam Location:  [facility] Procedure:      VAS [place] LOWER EXTREMITY VENOUS (DVT) Referring Phys: Wandra Feinstein RATHORE --------------------------------------------------------------------------------  Indications: Elevated Ddimer.  Risk Factors: Cancer. Limitations: Bradley Oliver positioning and body habitus. Comparison Study: No prior studies. Performing Technologist:  Oliver Hum RVT  Examination Guidelines: A complete evaluation includes B-mode imaging, spectral Doppler, color Doppler, and power Doppler as needed of all accessible portions of each vessel. Bilateral testing is considered an integral part of a complete examination. Limited examinations for  reoccurring indications may be performed as noted. The reflux portion of the exam is performed with the Bradley Oliver in reverse Trendelenburg.  +---------+---------------+---------+-----------+----------+--------------+ RIGHT    CompressibilityPhasicitySpontaneityPropertiesThrombus Aging +---------+---------------+---------+-----------+----------+--------------+ CFV      Full           Yes      Yes                                 +---------+---------------+---------+-----------+----------+--------------+ SFJ      Full                                                        +---------+---------------+---------+-----------+----------+--------------+ FV Prox  Full                                                        +---------+---------------+---------+-----------+----------+--------------+ FV Mid   Full                                                        +---------+---------------+---------+-----------+----------+--------------+ FV DistalFull                                                        +---------+---------------+---------+-----------+----------+--------------+ PFV      Full                                                        +---------+---------------+---------+-----------+----------+--------------+ POP      Full           Yes      Yes                                 +---------+---------------+---------+-----------+----------+--------------+ PTV      Full                                                        +---------+---------------+---------+-----------+----------+--------------+ PERO     Full                                                        +---------+---------------+---------+-----------+----------+--------------+   +---------+---------------+---------+-----------+----------+--------------+  LEFT     CompressibilityPhasicitySpontaneityPropertiesThrombus Aging  +---------+---------------+---------+-----------+----------+--------------+ CFV      Full           Yes      Yes                                 +---------+---------------+---------+-----------+----------+--------------+ SFJ      Full                                                        +---------+---------------+---------+-----------+----------+--------------+ FV Prox  Full                                                        +---------+---------------+---------+-----------+----------+--------------+ FV Mid   Full                                                        +---------+---------------+---------+-----------+----------+--------------+ FV DistalFull                                                        +---------+---------------+---------+-----------+----------+--------------+ PFV      Full                                                        +---------+---------------+---------+-----------+----------+--------------+ POP      Full           Yes      Yes                                 +---------+---------------+---------+-----------+----------+--------------+ PTV      Full                                                        +---------+---------------+---------+-----------+----------+--------------+ PERO     Full                                                        +---------+---------------+---------+-----------+----------+--------------+     Summary: RIGHT: - There is no evidence of deep vein thrombosis in the lower extremity.  - No cystic structure found in the popliteal fossa.  LEFT: - There is no evidence of deep vein thrombosis in the lower extremity.  - No  cystic structure found in the popliteal fossa.  *See table(s) above for measurements and observations. Electronically signed by Servando Snare MD on 04/02/2021 at 4:26:00 PM.    Final        Antibiotics: Anti-infectives (From admission, onward)    Start     Dose/Rate  Route Frequency Ordered Stop   04/03/21 0700  ceFAZolin (ANCEF) IVPB 2g/100 mL premix        2 g 200 mL/hr over 30 Minutes Intravenous Every 8 hours 04/03/21 0225 04/03/21 2259   04/03/21 0225  ceFAZolin (ANCEF) IVPB 2g/100 mL premix        2 g 200 mL/hr over 30 Minutes Intravenous 30 min pre-op 04/03/21 0225           DVT prophylaxis: SCDs  Code Status: DNR  Family Communication: No family at bedside   Consultants: Neurosurgery Oncology  Procedures: S/p decompression of metastatic spinal cord compression    Objective    Physical Examination:   General-appears in no acute distress Heart-S1-S2, regular, no murmur auscultated Lungs-clear to auscultation bilaterally, no wheezing or crackles auscultated Abdomen-soft, nontender, no organomegaly Extremities-no edema in the lower extremities Neuro-alert, oriented x3, weakness in lower extremities, sensations intact  Status is: Inpatient  Dispo: The Bradley Oliver is from: Home              Anticipated d/c is to: Skilled nursing facility              Anticipated d/c date is: 04/06/2021              Bradley Oliver currently not stable for discharge  Barrier to discharge-pending dysphagia evaluation  COVID-19 Labs  Recent Labs    04/01/21 1912 04/02/21 0506  DDIMER 1.69*  --   LDH  --  158    Lab Results  Component Value Date   SARSCOV2NAA NEGATIVE 04/02/2021     Pressure Injury 04/03/21 Sacrum Medial Stage 1 -  Intact skin with non-blanchable redness of a localized area usually over a bony prominence. (Active)  04/03/21 0240  Location: Sacrum  Location Orientation: Medial  Staging: Stage 1 -  Intact skin with non-blanchable redness of a localized area usually over a bony prominence.  Wound Description (Comments):   Present on Admission: Yes        Recent Results (from the past 240 hour(s))  Resp Panel by RT-PCR (Flu A&B, Covid) Nasopharyngeal Swab     Status: None   Collection Time: 04/02/21  1:56 AM    Specimen: Nasopharyngeal Swab; Nasopharyngeal(NP) swabs in vial transport medium  Result Value Ref Range Status   SARS Coronavirus 2 by RT PCR NEGATIVE NEGATIVE Final    Comment: (NOTE) SARS-CoV-2 target nucleic acids are NOT DETECTED.  The SARS-CoV-2 RNA is generally detectable in upper respiratory specimens during the acute phase of infection. The lowest concentration of SARS-CoV-2 viral copies this assay can detect is 138 copies/mL. A negative result does not preclude SARS-Cov-2 infection and should not be used as the sole basis for treatment or other Bradley Oliver management decisions. A negative result may occur with  improper specimen collection/handling, submission of specimen other than nasopharyngeal swab, presence of viral mutation(s) within the areas targeted by this assay, and inadequate number of viral copies(<138 copies/mL). A negative result must be combined with clinical observations, Bradley Oliver history, and epidemiological information. The expected result is Negative.  Fact Sheet for Patients:  EntrepreneurPulse.com.au  Fact Sheet for Healthcare Providers:  IncredibleEmployment.be  This test is no t yet approved or  cleared by the [place] and  has been authorized for detection and/or diagnosis of SARS-CoV-2 by FDA under an Emergency Use Authorization (EUA). This EUA will remain  in effect (meaning this test can be used) for the duration of the COVID-19 declaration under Section 564(b)(1) of the Act, 21 U.S.C.section 360bbb-3(b)(1), unless the authorization is terminated  or revoked sooner.       Influenza A by PCR NEGATIVE NEGATIVE Final   Influenza B by PCR NEGATIVE NEGATIVE Final    Comment: (NOTE) The Xpert Xpress SARS-CoV-2/FLU/RSV plus assay is intended as an aid in the diagnosis of influenza from Nasopharyngeal swab specimens and should not be used as a sole basis for treatment. Nasal washings and aspirates are  unacceptable for Xpert Xpress SARS-CoV-2/FLU/RSV testing.  Fact Sheet for Patients: EntrepreneurPulse.com.au  Fact Sheet for Healthcare Providers: IncredibleEmployment.be  This test is not yet approved or cleared by the [place] FDA and has been authorized for detection and/or diagnosis of SARS-CoV-2 by FDA under an Emergency Use Authorization (EUA). This EUA will remain in effect (meaning this test can be used) for the duration of the COVID-19 declaration under Section 564(b)(1) of the Act, 21 U.S.C. section 360bbb-3(b)(1), unless the authorization is terminated or revoked.  Performed at [facility], [place] [address], [place], [place] [address]     Oswald Hillock   [facility] If 7PM-7AM, please contact night-coverage at [email or web address], Office  [phone]   04/03/2021, 2:10 PM  LOS: 1 day

## 2021-04-03 NOTE — Op Note (Signed)
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, T4-6 stenosis Bilateral lower extremity weakness due to #2  POSTOP DIAGNOSIS: Same  PROCEDURE: T3-4, T4-5, T5-6, T6-7 posterior lateral instrumentation and arthrodesis Segmental pedicle screw instrumentation, bilateral T3, T4, T6, T7; K2 M Everest pedicle screws (T3 4.5 mm x 40 mm bilaterally, T4 4.5 mm x 40 mm bilaterally, T6 6.5 mm x 40 mm bilaterally, T7 6.5 mm x 40 mm bilaterally) Left T5 transpedicular decompression for decompression of neural elements Bilateral T4, T5, T6 laminectomies for resection of epidural tumor Intraoperative use of allograft Intraoperative use of microscope for microdissection Intraoperative use of fluoroscopy  SURGEON: Dr. Pieter Partridge C. Karry Causer, DO  ASSISTANT: None  ANESTHESIA: General Endotracheal  EBL: 500 cc  SPECIMENS: T5 epidural tumor  DRAINS: Medium Hemovac  COMPLICATIONS: None  CONDITION: Hemodynamically stable  HISTORY: Bradley Oliver. Thier is a 65 y.o. 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  He had been obtaining multiple opinions for further targeted therapies over the past 2 years and has not received any further chemotherapy since 2020.  1 week ago he began to have lower extremity weakness as well as sensory changes within his trunk and thighs and legs.  He denied any bowel or bladder difficulty.  Imaging revealed severe stenosis T4-6 with a T5 pathologic fracture causing severe stenosis and cord compression.  There is significant left-sided metastatic tumor involving the ribs, lung and  ventral and circumferential epidural space.  Oncology evaluated the patient as well as radiation, however given his significant instability score, surgery was recommended.  Oncology agreed that he had significant amount of time remaining in terms of his prognosis as well as further chemotherapy options therefore I offered the patient a decompression and instrumentation and fusion as above.  I discussed all risks, benefits and expected outcomes with the patient, his brother and his brother's wife.  Answered all their questions.  They agreed to proceed with surgical intervention.  PROCEDURE IN DETAIL: The patient was brought to the operating room. After induction of general anesthesia, the patient was positioned on the operative table in the prone position. All pressure points were meticulously padded. Skin incision was then marked out and prepped and draped in the usual sterile fashion.  Physician driven timeout was performed.  Midline incision was made over the T3-T7 spinous processes with a 10 blade.  Using Bovie electrocautery, the dorsal fascia was dissected down to midline.  Bilateral subperiosteal dissection was performed to expose the T3 spinous process, lamina and bilateral TPs, this was repeated at T4, T5, T6, T7.  Deep retractors were placed in the wound.  Lateral fluoroscopy confirmed the appropriate level.  Pilot holes were created bilaterally at T3 and the pedicle finder was used to access the bilateral pedicles.  These were felt with a ball-tipped probe and there were bony prominences in all directions and a bony floor.  A pedicle screws and selected as above and placed with appropriate bony purchase.  This was repeated at T4 bilaterally, T6 bilaterally, T7 bilaterally.  The left T6 pedicle screw had some slightly softer feel given the amount of tumor involvement at this level.  AP and lateral  fluoroscopy confirmed appropriate instrumentation placement.  The microscope was then sterilely draped and  brought into the field for the decompression.  Using Leksell rongeur, spinous processes of T4, T5, T6 were removed as well as the lamina partially.  Using a high-speed drill, a bilateral laminectomy at T4, T5 and T6 was performed down to the ligamentum flavum.  There was significant amount of tumor involvement within the bone and the lamina of T5 as well as the facet of T5 bilaterally.  Ligamentum flavum was identified and gently dissected from the epidural space with microcurette's.  The ligamentum flavum was resected at T4 bilaterally, T5 bilaterally and T6 along the superior portion bilaterally with a series of Kerrison rongeurs.  The epidural space was identified and there was significant epidural tumor burden bilaterally and circumferentially around T5.  Lateral recesses were decompressed with Kerrison rongeurs.  Along the right lateral recess at T6 there was a small durotomy.  Using 5-0 Prolene suture a simple interrupted suture was used for repair.  There was no further egress of CSF.  Multiple pieces of specimen were taken from the epidural space and sent for permanent pathology.  The left T5 pedicle was then removed with a high-speed drill as well as the left partial facet at T4-5.  This was followed down to the dorsal aspect of the vertebral body.  Hemostasis was achieved with Surgifoam.  Using microcurette's and Epstein curettes, the ventral epidural space was dissected and this was full of significant amount of tumor.  This was gently pushed ventrally to create space along the spinal canal.  Epidural hemostasis was achieved with bipolar cautery and Surgifoam.  Using a ball-tipped probe, the spinal cord was felt to be adequately decompressed from T4-T6 ventrally as well as posteriorly and in the lateral gutters.  The T3-4, T4-5, T6-7 and remaining T5-6 facets were decorticated bilaterally.  Allograft was packed in the lateral gutters.  Appropriate sized rods were measured, contoured and placed  bilaterally.  Setscrews were placed and final tightened to the manufacturer's recommendation.  The deep retractors were taken out of the wound.  Hemostasis was achieved with Surgifoam and bipolar cautery.  The wound was monitored for series of minutes and noted to be excellently hemostatic.  A medium Hemovac was tunneled inferiorly and placed in the epidural space.  0 Vicryl sutures were used for closure of fascia and muscle, 2-0 Vicryl's were used for dermis.  Skin was closed with staples.  Sterile dressing was applied.  At the end of the case all sponge, needle, and instrument counts were correct. The patient was then transferred to the stretcher, extubated, and taken to the post-anesthesia care unit in stable hemodynamic condition.

## 2021-04-03 NOTE — Evaluation (Signed)
Physical Therapy Evaluation Patient Details Name: Bradley Oliver. [place] MRN: [id number] DOB: 1955-12-14 Today's Date: 04/03/2021  History of Present Illness  65 y/o male presented to [facility] ED on 04/01/21 following fall due to weakness/numbness of bilateral LE weakness. MRI of C-spine no evidence of metastatic disease. MRI T-spine showed large destructive and infiltrative metastatic deposit centered at the L paraspinous region involvement of T4-T7 vertebral bodies, associated epidural extension with severe spinal stenosis, cord compression and cord signal changes at levels of T4-7, metatstatic lesions of L lung, associated pathologic fx of T5, and T11 vertebral body enhancing lesion. MRI L-spine showed 8 mm enhancing lesion within inferior aspect of L5 vertebral body suspicious for metastatic lesion. S/p T3-7 fusion, T4-6 laminectomy, T5 transpedicular decompresion on 11/11. 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 Patient presents with weakness (L>R), impaired balance, decreased activity tolerance, impaired coordination, impaired sensation, and impaired cognition. Patient requires maxA+2 for bed mobility this date. Placed patient on wedge on R side due to pain in R scapula. Patient tangential at times but able to redirect to attend to task. Educated patient on positioning, needs for potential home discharge, DME recommendations, and services to assist at home at discharge. Patient with poor safety awareness stating he will drive after discharge. Patient will benefit from skilled PT services during acute stay to address listed deficits. Recommend SNF at discharge to maximize functional independence and safety but patient likely to refuse and request to return home.         Recommendations for follow up therapy are one component of a  multi-disciplinary discharge planning process, led by the attending physician.  Recommendations may be updated based on patient status, additional functional criteria and insurance authorization.  Follow Up Recommendations Skilled nursing-short term rehab (<3 hours/day) (likely to refuse SNF and want home)    Assistance Recommended at Discharge Frequent or constant Supervision/Assistance  Functional Status Assessment Patient has had a recent decline in their functional status and/or demonstrates limited ability to make significant improvements in function in a reasonable and predictable amount of time  Equipment Recommendations  Hospital bed    Recommendations for Other Services       Precautions / Restrictions Precautions Precautions: Back Precaution Comments: hemovac aline foley      Mobility  Bed Mobility Overal bed mobility: Needs Assistance Bed Mobility: Supine to Sit;Sit to Supine     Supine to sit: +2 for physical assistance;Max assist Sit to supine: +2 for physical assistance;Max assist   General bed mobility comments: pt requires HOB increased and helicopter method to EOB. pt static sitting EOB for > 10 minutes max (A) to total. pt hugging pillow for support anteriorly. pt able to initiate bil le movement in static sitting. pt requires helicopter back to supine with pad. pad used throughout session to cradle patient as he has severe pain in the R scapula    Transfers                   General transfer comment: defer next session    Ambulation/Gait                  Stairs            Wheelchair Mobility    Modified Rankin (Stroke Patients Only)       Balance Overall balance assessment: Needs assistance Sitting-balance support: No  upper extremity supported;Feet supported Sitting balance-Leahy Scale: Poor                                       Pertinent Vitals/Pain Pain Assessment: Faces Faces Pain Scale: Hurts even  more Pain Location: back Pain Descriptors / Indicators: Operative site guarding Pain Intervention(s): Limited activity within patient's tolerance;Monitored during session;Premedicated before session;Repositioned    Home Living Family/patient expects to be discharged to:: Private residence Living Arrangements: Parent;Other relatives               Home Equipment: None Additional Comments: sleeps on the couch down stairs.    Prior Function Prior Level of Function : Driving             Mobility Comments: reports using nothing until he fell ADLs Comments: indep-- but reports using a urinal on the couch so question (A) needing to increase with pt minimizing some recent needs     Hand Dominance   Dominant Hand: Right    Extremity/Trunk Assessment   Upper Extremity Assessment Upper Extremity Assessment: Defer to OT evaluation LUE Deficits / Details: self limiting due to ALine and concern with line    Lower Extremity Assessment Lower Extremity Assessment: Generalized weakness LLE Deficits / Details: decreased coordination compared to R LE. L LE weaker than R LE. States sensation has improved. Prior to sx, patient stating warm sensation from nipple line to feet    Cervical / Trunk Assessment Cervical / Trunk Assessment: Back Surgery  Communication   Communication: No difficulties  Cognition Arousal/Alertness: Awake/alert Behavior During Therapy: Anxious Overall Cognitive Status: Impaired/Different from baseline Area of Impairment: Safety/judgement;Awareness                         Safety/Judgement: Decreased awareness of deficits;Decreased awareness of safety Awareness: Emergent   General Comments: pt reports that he can drive himself to [facility] clinical [facility] because he will use one foot for either pedal. pt with sensory changes and motor planning deficits observed this session. pt is unable to sit EOB without support.        General Comments General  comments (skin integrity, edema, etc.): VSS    Exercises Other Exercises Other Exercises: ankle pumps, knee flexion / extension hip flexion Other Exercises: educated on bracing with pillow to cough with good return demo   Assessment/Plan    PT Assessment Patient needs continued PT services  PT Problem List Decreased strength;Decreased range of motion;Decreased activity tolerance;Decreased balance;Decreased mobility;Decreased coordination;Decreased cognition;Decreased safety awareness;Impaired sensation       PT Treatment Interventions DME instruction;Gait training;Stair training;Functional mobility training;Therapeutic activities;Therapeutic exercise;Balance training;Neuromuscular re-education;Patient/family education    PT Goals (Current goals can be found in the Care Plan section)  Acute Rehab PT Goals Patient Stated Goal: did not state PT Goal Formulation: With patient Time For Goal Achievement: 04/17/21 Potential to Achieve Goals: Fair    Frequency Min 4X/week   Barriers to discharge        Co-evaluation PT/OT/SLP Co-Evaluation/Treatment: Yes Reason for Co-Treatment: For patient/therapist safety;To address functional/ADL transfers;Necessary to address cognition/behavior during functional activity;Complexity of the patient's impairments (multi-system involvement) PT goals addressed during session: Mobility/safety with mobility;Balance OT goals addressed during session: ADL's and self-care;Proper use of Adaptive equipment and DME;Strengthening/ROM       AM-PAC PT "6 Clicks" Mobility  Outcome Measure Help needed turning from your back to your side  while in a flat bed without using bedrails?: Total Help needed moving from lying on your back to sitting on the side of a flat bed without using bedrails?: Total Help needed moving to and from a bed to a chair (including a wheelchair)?: Total Help needed standing up from a chair using your arms (e.g., wheelchair or bedside  chair)?: Total Help needed to walk in hospital room?: Total Help needed climbing 3-5 steps with a railing? : Total 6 Click Score: 6    End of Session   Activity Tolerance: Patient tolerated treatment well Patient left: in bed;with call bell/phone within reach Nurse Communication: Mobility status PT Visit Diagnosis: Unsteadiness on feet (R26.81);Muscle weakness (generalized) (M62.81);History of falling (Z91.81);Difficulty in walking, not elsewhere classified (R26.2)    Time: 2589-4834 PT Time Calculation (min) (ACUTE ONLY): 34 min   Charges:   PT Evaluation $PT Eval Moderate Complexity: 1 Mod PT Treatments $Therapeutic Activity: 8-22 mins        Waylan Busta A. Gilford Rile PT, DPT Acute Rehabilitation Services Pager [phone] Office [phone]   Linna Hoff 04/03/2021, 1:58 PM

## 2021-04-03 NOTE — Transfer of Care (Signed)
Immediate Anesthesia Transfer of Care Note  Patient: Chaka Jefferys. Migliaccio  Procedure(s) Performed: THORACIC THREE-THORACIC SEVEN, INSTRUMENTATION AND FUSION, THORACIC FOUR-THORACIC SIX LAMINECTOMY, THORACIC FIVE TRANSPEDICULAR DECOMPRESSION (Spine Thoracic)  Patient Location: PACU  Anesthesia Type:General  Level of Consciousness: drowsy  Airway & Oxygen Therapy: Patient Spontanous Breathing and Patient connected to face mask oxygen  Post-op Assessment: Report given to RN and Post -op Vital signs reviewed and stable  Post vital signs: Reviewed and stable  Last Vitals:  Vitals Value Taken Time  BP    Temp    Pulse 70 04/03/21 0123  Resp 11 04/03/21 0123  SpO2 100 % 04/03/21 0123  Vitals shown include unvalidated device data.  Last Pain:  Vitals:   04/02/21 2000  TempSrc: Oral  PainSc: 0-No pain         Complications: No notable events documented.

## 2021-04-03 NOTE — Evaluation (Signed)
SLP Cancellation Note  Patient Details Name: Bradley Oliver. [place] MRN: [id number] DOB: [birth year]   Cancelled treatment:       Reason Eval/Treat Not Completed: Other (comment)   (Extensive conversation re: pt's premorbid dysphagia and chronic aspiration - as well as compromised tolerance given acute illness, pain with cough, lack of mobility, etc.  pt inquired if he would need MBS, advised given his h/o head and neck cancer and certain sensorimotor deficits with fibrosis, MBS would be indicated.  Pt inquired "How can you help me?"  SLP suspects pt's pharyngeal dysphagia will be gross but can not determine if can assist without instrumental evaluation.    Pt states he will not sit up today and to follow up to see when/if he may be ready. Encourage conversation with palliative re: his chronic dysphagia, impact on nutrition, hydration and aspiration.)    At this time, pt observed to swallow secretions t/o session - indicative of dysphagia.  He declined to try any liquids in his room due to not wanting to sit up.  Reviewed concern for lack of nutrition ramifications post-surgery. Will follow up Monday 11/14 re: pt's plan of care.     RN made aware.   Bradley Lime, MS [facility] SLP Acute Rehab Services Office [phone] Pager [phone]    Bradley Oliver 04/03/2021, 10:42 AM

## 2021-04-03 NOTE — Progress Notes (Signed)
Inpatient Rehab Admissions Coordinator:   Both PT and OT are recommending SNF. I will not pursue for CIR admission. CIR will sign off at this time.   Clemens Catholic, [place], [place] Admissions Coordinator  [phone] ([place]) [phone] (office)

## 2021-04-03 NOTE — Progress Notes (Signed)
Initial Nutrition Assessment  DOCUMENTATION CODES:   Underweight  INTERVENTION:   -RD will follow for diet advancement and add supplements as appropriate; pt refusing Boost supplements at this time -Consider placement of small bore feeding tube (ex cortrak tube) for supplemental nutrition. RD reached out to MD; plan to discuss possible cortrak placement with pt over the weekend. If feeding is pursued, recommend:  Initiate Vital AF 1.2 @ 20 ml/hr and increase by 10 ml every 8 hours to goal rate of 70 ml/hr.   Tube feeding regimen provides 2106 kcal (100% of needs), 126 grams of protein, and 1362 ml of H2O.    -If feedings are initiated, monitor Mg, K, and Phos daily and supplement as appropriate due to high refeeding risk  NUTRITION DIAGNOSIS:   Inadequate oral intake related to dysphagia as evidenced by per patient/family report.  GOAL:   Patient will meet greater than or equal to 90% of their needs  MONITOR:   PO intake, Supplement acceptance, Diet advancement, Labs, Weight trends, Skin, I & O's  REASON FOR ASSESSMENT:   Malnutrition Screening Tool    ASSESSMENT:   Bradley Oliver is a 65 y.o. 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  D-dimer elevated but CT angiogram chest negative for PE; showing medial left lung mass extending to the mediastinum.  Moderate partially loculated left pleural effusion, trace right pleural effusion.  Moderate pathologic compression fracture at T5.  MRI of C-spine showing no evidence of metastatic disease.  MRI of lumbar spine showing a 8 mm enhancing lesion within the inferior aspect of the L5 vertebral body suspicious for metastatic lesion.  MRI of thoracic spine showing large destructive and infiltrative  metastatic deposit centered at the left paraspinous region with involvement of the T4-T7 vertebral bodies.  Associated epidural extension with severe spinal stenosis, cord compression, and cord signal changes at the levels of T4-5 through T6-7.  Metastatic lesion extends to involve the adjacent posterior medial aspect of the left lung as well as the left adjacent mediastinum and left hilar/perihilar region.  Associated pathologic fracture of T5.  1 cm enhancing lesion within the T11 vertebral body suspicious for metastatic lesion.  Neurosurgery consulted by ED physician  Pt admitted with stage IV squamous cell carcinoma of rt tonsil with metastasis to lung and spine and neoplastic spinal cord compression.   Reviewed I/O's: +1.7 L x 24 hours  UOP: 825 ml x 24 hours  Drain output: 20 ml x 24 hours   Pt unavailable at time of visit. Attempted to speak with pt via call to hospital room phone, however, unable to reach. RD unable to obtain further nutrition-related history or complete nutrition-focused physical exam at this time.    Per SLP notes, pt with history of chronic dysphagia. He is declining MBSS and did not drink liquids for SLP today. He is also refusing oral nutrition supplements. He reports frustration that he will not receive adequate nutrition per palliative care notes.   Reviewed wt hx; wt has been stable over the past 3 years. Highly suspect malnutrition given pt's underweight status, however, unable to identify at this time.   Palliative care following for goals of care discussions. Pt is not interested in pursuing comfort care at this time.   Reached out to MD to discuss possibility of  placing cortrak to help optimize nutritional status given suspected malnutrition and poor oral intake.   Medications reviewed and include colace, potassium chloride, and dextrose 5%-0.45% sodium chloride infusion @ 75 ml/hr.   Labs reviewed.   Diet Order:   Diet Order             Diet clear  liquid Room service appropriate? Yes; Fluid consistency: Thin  Diet effective now                   EDUCATION NEEDS:   No education needs have been identified at this time  Skin:  Skin Assessment: Skin Integrity Issues: Skin Integrity Issues:: Stage I, Incisions Stage I: sacrum Incisions: closed back  Last BM:  Unknown  Height:   Ht Readings from Last 1 Encounters:  04/01/21 5\' 10"  (1.778 m)    Weight:   Wt Readings from Last 1 Encounters:  04/03/21 50 kg    Ideal Body Weight:  75.5 kg  BMI:  Body mass index is 15.82 kg/m.  Estimated Nutritional Needs:   Kcal:  2000-2200  Protein:  110-125 grams  Fluid:  > 2 L    Loistine Chance, RD, LDN, [place] Registered Dietitian II Certified Diabetes Care and Education Specialist Please refer to [facility] for RD and/or RD on-call/weekend/after hours pager

## 2021-04-04 DIAGNOSIS — R7989 Other specified abnormal findings of blood chemistry: Secondary | ICD-10-CM | POA: Diagnosis not present

## 2021-04-04 DIAGNOSIS — G9520 Unspecified cord compression: Secondary | ICD-10-CM | POA: Diagnosis not present

## 2021-04-04 DIAGNOSIS — C7949 Secondary malignant neoplasm of other parts of nervous system: Secondary | ICD-10-CM | POA: Diagnosis not present

## 2021-04-04 DIAGNOSIS — M8458XA Pathological fracture in neoplastic disease, other specified site, initial encounter for fracture: Secondary | ICD-10-CM | POA: Diagnosis not present

## 2021-04-04 LAB — COMPREHENSIVE METABOLIC PANEL
ALT: 11 U/L (ref 0–44)
AST: 31 U/L (ref 15–41)
Albumin: 3 g/dL — ABNORMAL LOW (ref 3.5–5.0)
Alkaline Phosphatase: 58 U/L (ref 38–126)
Anion gap: 7 (ref 5–15)
BUN: 17 mg/dL (ref 8–23)
CO2: 25 mmol/L (ref 22–32)
Calcium: 8.5 mg/dL — ABNORMAL LOW (ref 8.9–10.3)
Chloride: 101 mmol/L (ref 98–111)
Creatinine, Ser: 0.69 mg/dL (ref 0.61–1.24)
GFR, Estimated: >60 mL/min (ref >60)
Glucose, Bld: 148 mg/dL — ABNORMAL HIGH (ref 70–99)
Potassium: 3.8 mmol/L (ref 3.5–5.1)
Sodium: 133 mmol/L — ABNORMAL LOW (ref 135–145)
Total Bilirubin: 0.8 mg/dL (ref 0.3–1.2)
Total Protein: 5.2 g/dL — ABNORMAL LOW (ref 6.5–8.1)

## 2021-04-04 LAB — CBC
HCT: 35.8 % — ABNORMAL LOW (ref 39.0–52.0)
Hemoglobin: 12.2 g/dL — ABNORMAL LOW (ref 13.0–17.0)
MCH: 29.2 pg (ref 26.0–34.0)
MCHC: 34.1 g/dL (ref 30.0–36.0)
MCV: 85.6 fL (ref 80.0–100.0)
Platelets: 224 10*3/uL (ref 150–400)
RBC: 4.18 MIL/uL — ABNORMAL LOW (ref 4.22–5.81)
RDW: 14.6 % (ref 11.5–15.5)
WBC: 11.7 10*3/uL — ABNORMAL HIGH (ref 4.0–10.5)
nRBC: 0 % (ref 0.0–0.2)

## 2021-04-04 MED ORDER — BOOST PLUS PO LIQD
237.0000 mL | Freq: Three times a day (TID) | ORAL | Status: DC
  Administered 2021-04-04 – 2021-04-09 (×15): 237 mL via ORAL
  Filled 2021-04-04 (×18): qty 237

## 2021-04-04 MED ORDER — DOCUSATE SODIUM 50 MG/5ML PO LIQD
100.0000 mg | Freq: Two times a day (BID) | ORAL | Status: DC
  Filled 2021-04-04 (×6): qty 10

## 2021-04-04 NOTE — Progress Notes (Signed)
[facility]  PROGRESS NOTE  Bradley Oliver TGG:[id number] DOB: 1956-01-08 DOA: 04/01/2021 PCP: Patient, [name]   Brief HPI:   65 year old male with medical history of stage IV squamous cell carcinoma of right tonsil, HPV positive, with lung metastasis post chemo/radiation/chemotherapy, hypothyroidism presented to ED after a fall at home in the setting of weakness/numbness of bilateral lower extremities.  D-dimer was elevated, CTA chest was negative for PE.  Showed medial left lung mass extending to mediastinum.  MRI of lumbar spine showed 8 mm enhancing lesion within the inferior aspect of L5 vertebral body suspicious for metastatic lesion.  MRI of thoracic spine showed large destructive and infiltrative metastatic deposit centered at the left paraspinous region with involvement of T4-T7 vertebral bodies.  Associated epidural extension with severe spinal stenosis, cord compression and cord signal changes at the level of T4-5 through T6-7.     Metastatic lesion extends to involve the adjacent posterior medial aspect of the left lung as well as the left adjacent mediastinum and left hilar/perihilar region.  Associated pathologic fracture of T5.  1 cm enhancing lesion within the T11 vertebral body suspicious for metastatic lesion.  Neurosurgery consulted by ED physician.  Patient was transferred to [facility], underwent decompression T4-T6 with T5 transverse decompression, T3-T7 pedicle screw instrumentation.  Subjective   Patient seen and examined, has poor p.o. intake.  Willing to do swallow evaluation if moved to regular floor.  Does not want to be hooked up to monitor.  Also willing to try boost for nutrition.   Assessment/Plan:    Cord compression due to underlying metastatic disease -MRI of the T-spine showed significant cord compression at T4-5 through T6-7 -S/p decompression per neurosurgery -Continue Decadron 4 mg IV every 6 hours  Dysphagia -Patient  has chronic dysphagia -Speech therapy was consulted however patient has refused MBS -Speech therapy to follow-up on Monday -We will start D5 half-normal saline at 75 mill per hour -Continue boost 3 times daily for nutrition   Stage IV squamous cell carcinoma of right tonsil, HPV positive -With metastasis to lung and spine -PET/CT from 10/2020 also mention possible small bowel involvement -Previously underwent chemotherapy and radiation at [facility] -He also received immunotherapy at that time -Patient has been to multiple centers for treatment of HPV directed therapy -Oncology has been consulted  Left pleural effusion -Seen on CTA chest -Thoracentesis ordered -We will follow labs for fluid analysis  Elevated D-dimer -Likely from underlying cancer -CTA chest negative for PE -Venous duplex of lower extremities negative for DVT  Hypothyroidism -Likely from previous treatment with monotherapy -Continue Synthroid   Scheduled medications:    sodium chloride   Intravenous Once   Chlorhexidine Gluconate Cloth  6 each Topical Daily   dexamethasone (DECADRON) injection  4 mg Intravenous Q6H   docusate  100 mg Oral BID   heparin injection (subcutaneous)  5,000 Units Subcutaneous Q12H   lactose free nutrition  237 mL Oral TID WC   levothyroxine  50 mcg Oral Q0600   mouth rinse  15 mL Mouth Rinse BID   potassium chloride  40 mEq Oral Once   sodium chloride flush  3 mL Intravenous Q12H     Data Reviewed:   CBG:  Recent Labs  Lab 04/01/21 2002  GLUCAP 104*    SpO2: 96 %    Vitals:   04/04/21 0600 04/04/21 0700 04/04/21 0800 04/04/21 0900  BP: 114/73 96/71 103/66 122/89  Pulse: 67 61 63 87  Resp:  13 10 12 19   Temp:   97.9 F (36.6 C)   TempSrc:   Oral   SpO2: 94% 95% 95% 96%  Weight:      Height:         Intake/Output Summary (Last 24 hours) at 04/04/2021 1105 Last data filed at 04/04/2021 1000 Gross per 24 hour  Intake 2345.56 ml  Output 2085 ml  Net  260.56 ml    11/11 1901 - 11/13 0700 In: 4660.6 [P.O.:480; I.V.:3300.5] Out: 3710 [Urine:2925; Drains:385]  Filed Weights   04/01/21 1652 04/03/21 0200  Weight: 49.9 kg 50 kg    Data Reviewed: Basic Metabolic Panel: Recent Labs  Lab 04/01/21 1912 04/02/21 2341 04/03/21 0225 04/04/21 0340  NA 137 136  --  133*  K 3.4* 4.2  --  3.8  CL 101  --   --  101  CO2 27  --   --  25  GLUCOSE 97  --   --  148*  BUN 20  --   --  17  CREATININE 0.72  --  0.92 0.69  CALCIUM 9.3  --   --  8.5*   Liver Function Tests: Recent Labs  Lab 04/01/21 1912 04/04/21 0340  AST 27 31  ALT 11 11  ALKPHOS 82 58  BILITOT 1.2 0.8  PROT 7.2 5.2*  ALBUMIN 4.2 3.0*   No results for input(s): LIPASE, AMYLASE in the last 168 hours. No results for input(s): AMMONIA in the last 168 hours. CBC: Recent Labs  Lab 04/01/21 1912 04/02/21 2341 04/03/21 0225 04/04/21 0340  WBC 6.6  --  10.6* 11.7*  NEUTROABS 5.4  --   --   --   HGB 14.0 9.2* 12.8* 12.2*  HCT 40.9 27.0* 37.1* 35.8*  MCV 88.1  --  86.7 85.6  PLT 274  --  205 224   Cardiac Enzymes: No results for input(s): CKTOTAL, CKMB, CKMBINDEX, TROPONINI in the last 168 hours. BNP (last 3 results) No results for input(s): BNP in the last 8760 hours.  ProBNP (last 3 results) No results for input(s): PROBNP in the last 8760 hours.  CBG: Recent Labs  Lab 04/01/21 2002  GLUCAP 104*       Radiology Reports  [place] CHEST (PLEURAL EFFUSION)  Result Date: 04/02/2021 CLINICAL DATA:  Pleural effusion. EXAM: CHEST ULTRASOUND COMPARISON:  CTA Chest earlier same day FINDINGS: Small left pleural effusion.  No discernible right effusion. IMPRESSION: Small left pleural effusion. Inadequate volume for safe image guided thoracentesis. Electronically Signed   By: Misty Stanley M.D.   On: 04/02/2021 12:23   DG Thoracic Spine 1 View  Result Date: 04/03/2021 CLINICAL DATA:  T3-7 fusion EXAM: OPERATIVE THORACIC SPINE 1 VIEW(S) COMPARISON:  None.  FLUOROSCOPY TIME:  36 seconds FINDINGS: Intraoperative fluoroscopic image during T3-7 fusion with pedicle screws. IMPRESSION: Intraoperative fluoroscopic images, as above. Electronically Signed   By: Julian Hy M.D.   On: 04/03/2021 02:56   DG C-Arm 1-60 Min-No Report  Result Date: 04/03/2021 Fluoroscopy was utilized by the requesting physician.  No radiographic interpretation.   DG C-Arm 1-60 Min-No Report  Result Date: 04/03/2021 Fluoroscopy was utilized by the requesting physician.  No radiographic interpretation.       Antibiotics: Anti-infectives (From admission, onward)    Start     Dose/Rate Route Frequency Ordered Stop   04/03/21 0700  ceFAZolin (ANCEF) IVPB 2g/100 mL premix        2 g 200 mL/hr over 30 Minutes Intravenous Every 8  hours 04/03/21 0225 04/03/21 1730   04/03/21 0225  ceFAZolin (ANCEF) IVPB 2g/100 mL premix  Status:  Discontinued        2 g 200 mL/hr over 30 Minutes Intravenous 30 min pre-op 04/03/21 0225 04/04/21 0808         DVT prophylaxis: SCDs  Code Status: DNR  Family Communication: No family at bedside   Consultants: Neurosurgery Oncology  Procedures: S/p decompression of metastatic spinal cord compression    Objective    Physical Examination:   General-appears in no acute distress Heart-S1-S2, regular, no murmur auscultated Lungs-clear to auscultation bilaterally, no wheezing or crackles auscultated Abdomen-soft, nontender, no organomegaly Extremities-no edema in the lower extremities Neuro-alert, oriented x3, bilateral lower leg weakness, sensations intact bilaterally  Status is: Inpatient  Dispo: The patient is from: Home              Anticipated d/c is to: Skilled nursing facility              Anticipated d/c date is: 04/06/2021              Patient currently not stable for discharge  Barrier to discharge-pending dysphagia evaluation  COVID-19 Labs  Recent Labs    04/01/21 1912 04/02/21 0506  DDIMER  1.69*  --   LDH  --  158    Lab Results  Component Value Date   SARSCOV2NAA NEGATIVE 04/02/2021     Pressure Injury 04/03/21 Sacrum Medial Stage 1 -  Intact skin with non-blanchable redness of a localized area usually over a bony prominence. (Active)  04/03/21 0240  Location: Sacrum  Location Orientation: Medial  Staging: Stage 1 -  Intact skin with non-blanchable redness of a localized area usually over a bony prominence.  Wound Description (Comments):   Present on Admission: Yes        Recent Results (from the past 240 hour(s))  Resp Panel by RT-PCR (Flu A&B, Covid) Nasopharyngeal Swab     Status: None   Collection Time: 04/02/21  1:56 AM   Specimen: Nasopharyngeal Swab; Nasopharyngeal(NP) swabs in vial transport medium  Result Value Ref Range Status   SARS Coronavirus 2 by RT PCR NEGATIVE NEGATIVE Final    Comment: (NOTE) SARS-CoV-2 target nucleic acids are NOT DETECTED.  The SARS-CoV-2 RNA is generally detectable in upper respiratory specimens during the acute phase of infection. The lowest concentration of SARS-CoV-2 viral copies this assay can detect is 138 copies/mL. A negative result does not preclude SARS-Cov-2 infection and should not be used as the sole basis for treatment or other patient management decisions. A negative result may occur with  improper specimen collection/handling, submission of specimen other than nasopharyngeal swab, presence of viral mutation(s) within the areas targeted by this assay, and inadequate number of viral copies(<138 copies/mL). A negative result must be combined with clinical observations, patient history, and epidemiological information. The expected result is Negative.  Fact Sheet for Patients:  EntrepreneurPulse.com.au  Fact Sheet for Healthcare Providers:  IncredibleEmployment.be  This test is no t yet approved or cleared by the [place] FDA and  has been authorized for  detection and/or diagnosis of SARS-CoV-2 by FDA under an Emergency Use Authorization (EUA). This EUA will remain  in effect (meaning this test can be used) for the duration of the COVID-19 declaration under Section 564(b)(1) of the Act, 21 U.S.C.section 360bbb-3(b)(1), unless the authorization is terminated  or revoked sooner.       Influenza A by PCR NEGATIVE NEGATIVE Final  Influenza B by PCR NEGATIVE NEGATIVE Final    Comment: (NOTE) The Xpert Xpress SARS-CoV-2/FLU/RSV plus assay is intended as an aid in the diagnosis of influenza from Nasopharyngeal swab specimens and should not be used as a sole basis for treatment. Nasal washings and aspirates are unacceptable for Xpert Xpress SARS-CoV-2/FLU/RSV testing.  Fact Sheet for Patients: EntrepreneurPulse.com.au  Fact Sheet for Healthcare Providers: IncredibleEmployment.be  This test is not yet approved or cleared by the [place] FDA and has been authorized for detection and/or diagnosis of SARS-CoV-2 by FDA under an Emergency Use Authorization (EUA). This EUA will remain in effect (meaning this test can be used) for the duration of the COVID-19 declaration under Section 564(b)(1) of the Act, 21 U.S.C. section 360bbb-3(b)(1), unless the authorization is terminated or revoked.  Performed at [facility], [place] [address] [place], [place] [address]     Oswald Hillock   [facility] If 7PM-7AM, please contact night-coverage at [email or web address], Office  [phone]   04/04/2021, 11:05 AM  LOS: 2 days

## 2021-04-04 NOTE — Progress Notes (Signed)
Pt stating he is more willing to participate with patient care if he can get EKG and continuous pulse ox removed. Pt stated he's a DNR, so why does he need to be monitored, RN paged Dr. Darrick Meigs and new orders received.

## 2021-04-04 NOTE — Progress Notes (Signed)
Patient ID: Bradley Oliver, male   DOB: 06-11-1955, 65 y.o.   MRN: [id number] BP 110/71 (BP Location: Right Arm)   Pulse 75   Temp 98.8 F (37.1 C) (Oral)   Resp 17   Ht 5\' 10"  (1.778 m)   Wt 50 kg   SpO2 97%   BMI 15.82 kg/m  Alert, oriented x 4 Speech is clear Moving all extremities well Wound is clean, and dry

## 2021-04-05 ENCOUNTER — Other Ambulatory Visit: Payer: Self-pay | Admitting: Radiation Therapy

## 2021-04-05 ENCOUNTER — Encounter (HOSPITAL_COMMUNITY): Payer: Self-pay | Admitting: Neurological Surgery

## 2021-04-05 DIAGNOSIS — G9520 Unspecified cord compression: Secondary | ICD-10-CM | POA: Diagnosis not present

## 2021-04-05 DIAGNOSIS — R7989 Other specified abnormal findings of blood chemistry: Secondary | ICD-10-CM | POA: Diagnosis not present

## 2021-04-05 DIAGNOSIS — C7949 Secondary malignant neoplasm of other parts of nervous system: Secondary | ICD-10-CM | POA: Diagnosis not present

## 2021-04-05 DIAGNOSIS — M8458XA Pathological fracture in neoplastic disease, other specified site, initial encounter for fracture: Secondary | ICD-10-CM | POA: Diagnosis not present

## 2021-04-05 MED ORDER — ADULT MULTIVITAMIN W/MINERALS CH
1.0000 | ORAL_TABLET | Freq: Every day | ORAL | Status: DC
  Administered 2021-04-06 – 2021-04-09 (×4): 1 via ORAL
  Filled 2021-04-05 (×4): qty 1

## 2021-04-05 NOTE — Progress Notes (Signed)
[facility]  PROGRESS NOTE  Bradley Oliver. 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  D-dimer was elevated, CTA chest was negative for PE.  Showed medial left lung mass extending to mediastinum.  MRI of lumbar spine showed 8 mm enhancing lesion within the inferior aspect of L5 vertebral body suspicious for metastatic lesion.  MRI of thoracic spine showed large destructive and infiltrative metastatic deposit centered at the left paraspinous region with involvement of T4-T7 vertebral bodies.  Associated epidural extension with severe spinal stenosis, cord compression and cord signal changes at the level of T4-5 through T6-7.     Metastatic lesion extends to involve the adjacent posterior medial aspect of the left lung as well as the left adjacent mediastinum and left hilar/perihilar region.  Associated pathologic fracture of T5.  1 cm enhancing lesion within the T11 vertebral body suspicious for metastatic lesion.  Neurosurgery consulted by ED physician.  Patient was transferred to [facility], underwent decompression T4-T6 with T5 transverse decompression, T3-T7 pedicle screw instrumentation.  Subjective   Patient seen and examined, feels better this morning.  Willing to retry swallow evaluation   Assessment/Plan:    Cord compression due to underlying metastatic disease -MRI of the T-spine showed significant cord compression at T4-5 through T6-7 -S/p decompression per neurosurgery -Continue Decadron 4 mg IV every 6 hours -Neurosurgery following  Dysphagia -Patient has chronic dysphagia -Speech therapy was consulted however patient had earlier  refused MBS.   -Speech therapy to check his swallowing today again.  As patient is agreeable for swallowing study. -We will start D5 half-normal saline at 75 mill per hour -Continue boost 3 times daily for nutrition  Stage IV squamous cell carcinoma of right tonsil, HPV positive -With metastasis to lung and spine -PET/CT from 10/2020 also mention possible small bowel involvement -Previously underwent chemotherapy and radiation at [facility] -He also received immunotherapy at that time -Patient has been to multiple centers for treatment of HPV directed therapy -Oncology has been consulted  Left pleural effusion -Seen on CTA chest -Thoracentesis ordered -We will follow labs for fluid analysis  Elevated D-dimer -Likely from underlying cancer -CTA chest negative for PE -Venous duplex of lower extremities negative for DVT  Hypothyroidism -Likely from previous treatment with monotherapy -Continue Synthroid   Scheduled medications:    sodium chloride   Intravenous Once   Chlorhexidine Gluconate Cloth  6 each Topical Daily   dexamethasone (DECADRON) injection  4 mg Intravenous Q6H   docusate  100 mg Oral BID   heparin injection (subcutaneous)  5,000 Units Subcutaneous Q12H   lactose free nutrition  237 mL Oral TID WC   levothyroxine  50 mcg Oral Q0600   mouth rinse  15 mL Mouth Rinse BID   multivitamin with minerals  1 tablet Oral Daily   potassium chloride  40 mEq Oral Once   sodium chloride flush  3 mL Intravenous Q12H     Data Reviewed:   CBG:  Recent Labs  Lab 04/01/21 2002  GLUCAP 104*    SpO2: 97 %    Vitals:   04/05/21 0400 04/05/21 0800 04/05/21 0801 04/05/21 1200  BP: 126/88 112/77    Pulse: 69 80  Resp: 18 16    Temp: 98.8 F (37.1 C) 98.6 F (37 C) 98.6 F (37 C) 98.1 F (36.7 C)  TempSrc: Oral Oral Oral Oral  SpO2: 95% 97%    Weight:      Height:         Intake/Output Summary (Last 24 hours) at 04/05/2021 1215 Last data filed at  04/05/2021 0900 Gross per 24 hour  Intake 2453.39 ml  Output 2195 ml  Net 258.39 ml    11/12 1901 - 11/14 0700 In: 3095.6 [P.O.:1325; I.V.:1770.6] Out: 3175 [Urine:2975; Drains:200]  Filed Weights   04/01/21 1652 04/03/21 0200  Weight: 49.9 kg 50 kg    Data Reviewed: Basic Metabolic Panel: Recent Labs  Lab 04/01/21 1912 04/02/21 2341 04/03/21 0225 04/04/21 0340  NA 137 136  --  133*  K 3.4* 4.2  --  3.8  CL 101  --   --  101  CO2 27  --   --  25  GLUCOSE 97  --   --  148*  BUN 20  --   --  17  CREATININE 0.72  --  0.92 0.69  CALCIUM 9.3  --   --  8.5*   Liver Function Tests: Recent Labs  Lab 04/01/21 1912 04/04/21 0340  AST 27 31  ALT 11 11  ALKPHOS 82 58  BILITOT 1.2 0.8  PROT 7.2 5.2*  ALBUMIN 4.2 3.0*   No results for input(s): LIPASE, AMYLASE in the last 168 hours. No results for input(s): AMMONIA in the last 168 hours. CBC: Recent Labs  Lab 04/01/21 1912 04/02/21 2341 04/03/21 0225 04/04/21 0340  WBC 6.6  --  10.6* 11.7*  NEUTROABS 5.4  --   --   --   HGB 14.0 9.2* 12.8* 12.2*  HCT 40.9 27.0* 37.1* 35.8*  MCV 88.1  --  86.7 85.6  PLT 274  --  205 224   Cardiac Enzymes: No results for input(s): CKTOTAL, CKMB, CKMBINDEX, TROPONINI in the last 168 hours. BNP (last 3 results) No results for input(s): BNP in the last 8760 hours.  ProBNP (last 3 results) No results for input(s): PROBNP in the last 8760 hours.  CBG: Recent Labs  Lab 04/01/21 2002  GLUCAP 104*       Radiology Reports  No results found.     Antibiotics: Anti-infectives (From admission, onward)    Start     Dose/Rate Route Frequency Ordered Stop   04/03/21 0700  ceFAZolin (ANCEF) IVPB 2g/100 mL premix        2 g 200 mL/hr over 30 Minutes Intravenous Every 8 hours 04/03/21 0225 04/03/21 1730   04/03/21 0225  ceFAZolin (ANCEF) IVPB 2g/100 mL premix  Status:  Discontinued        2 g 200 mL/hr over 30 Minutes Intravenous 30 min pre-op 04/03/21 0225 04/04/21 0808          DVT prophylaxis: SCDs  Code Status: DNR  Family Communication: No family at bedside   Consultants: Neurosurgery Oncology  Procedures: S/p decompression of metastatic spinal cord compression    Objective    Physical Examination:   General-appears in no acute distress Heart-S1-S2, regular, no murmur auscultated Lungs-clear to auscultation bilaterally, no wheezing or crackles auscultated Abdomen-soft, nontender, no organomegaly Extremities-no edema in the lower extremities Neuro-alert, oriented x3, sensation intact in bilateral lower extremities, moving both lower extremities  Status is: Inpatient  Dispo: The patient is from: Home  Anticipated d/c is to: Skilled nursing facility              Anticipated d/c date is: 04/06/2021              Patient currently not stable for discharge  Barrier to discharge-pending dysphagia evaluation  COVID-19 Labs  No results for input(s): DDIMER, FERRITIN, LDH, CRP in the last 72 hours.   Lab Results  Component Value Date   SARSCOV2NAA NEGATIVE 04/02/2021     Pressure Injury 04/03/21 Sacrum Medial Stage 1 -  Intact skin with non-blanchable redness of a localized area usually over a bony prominence. (Active)  04/03/21 0240  Location: Sacrum  Location Orientation: Medial  Staging: Stage 1 -  Intact skin with non-blanchable redness of a localized area usually over a bony prominence.  Wound Description (Comments):   Present on Admission: Yes        Recent Results (from the past 240 hour(s))  Resp Panel by RT-PCR (Flu A&B, Covid) Nasopharyngeal Swab     Status: None   Collection Time: 04/02/21  1:56 AM   Specimen: Nasopharyngeal Swab; Nasopharyngeal(NP) swabs in vial transport medium  Result Value Ref Range Status   SARS Coronavirus 2 by RT PCR NEGATIVE NEGATIVE Final    Comment: (NOTE) SARS-CoV-2 target nucleic acids are NOT DETECTED.  The SARS-CoV-2 RNA is generally detectable in upper  respiratory specimens during the acute phase of infection. The lowest concentration of SARS-CoV-2 viral copies this assay can detect is 138 copies/mL. A negative result does not preclude SARS-Cov-2 infection and should not be used as the sole basis for treatment or other patient management decisions. A negative result may occur with  improper specimen collection/handling, submission of specimen other than nasopharyngeal swab, presence of viral mutation(s) within the areas targeted by this assay, and inadequate number of viral copies(<138 copies/mL). A negative result must be combined with clinical observations, patient history, and epidemiological information. The expected result is Negative.  Fact Sheet for Patients:  EntrepreneurPulse.com.au  Fact Sheet for Healthcare Providers:  IncredibleEmployment.be  This test is no t yet approved or cleared by the [place] FDA and  has been authorized for detection and/or diagnosis of SARS-CoV-2 by FDA under an Emergency Use Authorization (EUA). This EUA will remain  in effect (meaning this test can be used) for the duration of the COVID-19 declaration under Section 564(b)(1) of the Act, 21 U.S.C.section 360bbb-3(b)(1), unless the authorization is terminated  or revoked sooner.       Influenza A by PCR NEGATIVE NEGATIVE Final   Influenza B by PCR NEGATIVE NEGATIVE Final    Comment: (NOTE) The Xpert Xpress SARS-CoV-2/FLU/RSV plus assay is intended as an aid in the diagnosis of influenza from Nasopharyngeal swab specimens and should not be used as a sole basis for treatment. Nasal washings and aspirates are unacceptable for Xpert Xpress SARS-CoV-2/FLU/RSV testing.  Fact Sheet for Patients: EntrepreneurPulse.com.au  Fact Sheet for Healthcare Providers: IncredibleEmployment.be  This test is not yet approved or cleared by the [place] FDA and has been  authorized for detection and/or diagnosis of SARS-CoV-2 by FDA under an Emergency Use Authorization (EUA). This EUA will remain in effect (meaning this test can be used) for the duration of the COVID-19 declaration under Section 564(b)(1) of the Act, 21 U.S.C. section 360bbb-3(b)(1), unless the authorization is terminated or revoked.  Performed at [facility], [place] [address] [place], [place] [address]     Oswald Hillock   [facility] If  7PM-7AM, please contact night-coverage at [email or web address], Office  [phone]   04/05/2021, 12:15 PM  LOS: 3 days

## 2021-04-05 NOTE — Evaluation (Signed)
Clinical/Bedside Swallow Evaluation Patient Details  Name: Bradley Oliver. Bradley Oliver MRN: [id number] Date of Birth: 16-Mar-1956  Today's Date: 04/05/2021 Time: SLP Start Time (ACUTE ONLY): 0902 SLP Stop Time (ACUTE ONLY): 0924 SLP Time Calculation (min) (ACUTE ONLY): 22 min  Past Medical History:  Past Medical History:  Diagnosis Date   Blood in stool    Cancer ([place]) 08/05/14   right tonsil, P16 positive   Past Surgical History:  Past Surgical History:  Procedure Laterality Date   EYE SURGERY     KNEE SURGERY     tonsil biopsy Right 08/05/14   invasive squamous cell carcinoma   HPI:  65 y/o male presented to [facility] ED on 04/01/21 following fall due to weakness/numbness of bilateral LE weakness. MRI of C-spine no evidence of metastatic disease. MRI T-spine showed large destructive and infiltrative metastatic deposit centered at the L paraspinous region involvement of T4-T7 vertebral bodies, associated epidural extension with severe spinal stenosis, cord compression and cord signal changes at levels of T4-7, metatstatic lesions of L lung, associated pathologic fx of T5, and T11 vertebral body enhancing lesion. MRI L-spine showed 8 mm enhancing lesion within inferior aspect of L5 vertebral body suspicious for metastatic lesion. S/p T3-7 fusion, T4-6 laminectomy, T5 transpedicular decompresion on 11/11. 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 Oral mechanism exam and oral cavity WFL. Pt detailed current strategies used to compensate for aforementioned swallowing difficulties including small bites, extensive mastication of solids,  and adhering to a mostly liquid diet with easy to chew solids at home. SLP further educated pt re: maintaining upright posture at mealtimes. SLP observed pt with current full liquid diet, noting no oral phase impairments or overt s/s of penetration/aspiration across consistencies. Pt expressed wishes for ordering soups/stews containing solids. SLP recommend regular/thin liquid diet; pt educated re: ordering appropriate food from menu for all mealtimes. Administer medications crushed in puree. Pt requires no further f/u from SLP. SLP Visit Diagnosis: Dysphagia, unspecified (R13.10)    Aspiration Risk  Mild aspiration risk    Diet Recommendation Regular;Thin liquid   Liquid Administration via: Cup;Straw Medication Administration: Crushed with puree Supervision: Patient able to self feed Compensations: Small sips/bites;Minimize environmental distractions Postural Changes: Seated upright at 90 degrees;Remain upright for at least 30 minutes after po intake    Other  Recommendations Oral Care Recommendations: Oral care BID    Recommendations for follow up therapy are one component of a multi-disciplinary discharge planning process, led by the attending physician.  Recommendations may be updated based on patient status, additional functional criteria and insurance authorization.  Follow up Recommendations No SLP follow up      Assistance Recommended at Discharge PRN  Functional Status Assessment Patient has not had a recent decline in their functional status  Frequency and Duration            Prognosis        Swallow Study   General Date of Onset: 04/01/21 HPI: 65 y/o male presented to [facility] ED on 04/01/21 following fall due to weakness/numbness of bilateral LE weakness. MRI of C-spine no evidence of metastatic disease. MRI T-spine showed large destructive and infiltrative metastatic deposit centered at the L paraspinous region involvement  of T4-T7 vertebral bodies, associated epidural  extension with severe spinal stenosis, cord compression and cord signal changes at levels of T4-7, metatstatic lesions of L lung, associated pathologic fx of T5, and T11 vertebral body enhancing lesion. MRI L-spine showed 8 mm enhancing lesion within inferior aspect of L5 vertebral body suspicious for metastatic lesion. S/p T3-7 fusion, T4-6 laminectomy, T5 transpedicular decompresion on 11/11. 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 (HPV positive) with lung metastasis post chemo/radiation/immunotherapy, hypothyroidism Type of Study: Bedside Swallow Evaluation Previous Swallow Assessment: MBS 2019 Diet Prior to this Study: Other (Comment) (full liquid) Temperature Spikes Noted: No Respiratory Status: Room air History of Recent Intubation: Yes Length of Intubations (days): 1 days (for surgery) Date extubated: 04/03/21 Behavior/Cognition: Alert;Cooperative;Pleasant mood Oral Cavity Assessment: Within Functional Limits Oral Care Completed by SLP: No Oral Cavity - Dentition: Adequate natural dentition Vision: Functional for self-feeding Self-Feeding Abilities: Able to feed self Patient Positioning: Partially reclined Baseline Vocal Quality: Hoarse    Oral/Motor/Sensory Function Overall Oral Motor/Sensory Function: Within functional limits   Ice Chips Ice chips: Not tested   Thin Liquid Thin Liquid: Within functional limits Presentation: Straw;Self Fed    Nectar Thick Nectar Thick Liquid: Within functional limits Presentation: Spoon;Straw   Honey Thick Honey Thick Liquid: Not tested   Puree Puree: Not tested   Solid     Solid: Not tested     .Dewitt Rota, SLP-Student  Haeley Fordham 04/05/2021,10:32 AM

## 2021-04-05 NOTE — Progress Notes (Signed)
Subjective: Patient reports that he feels much better than he did when he arrived. He has appropriate incisional pain. He reports improved strength and sensation. No acute events overnight.   Objective: Vital signs in last 24 hours: Temp:  [97.9 F (36.6 C)-98.8 F (37.1 C)] 98.6 F (37 C) (11/14 0801) Pulse Rate:  [69-87] 69 (11/14 0400) Resp:  [17-19] 18 (11/14 0400) BP: (110-133)/(71-89) 126/88 (11/14 0400) SpO2:  [95 %-98 %] 95 % (11/14 0400)  Intake/Output from previous day: 11/13 0701 - 11/14 0700 In: 2520.7 [P.O.:1325; I.V.:1195.7] Out: 1695 [Urine:1575; Drains:120] Intake/Output this shift: Total I/O In: -  Out: 500 [Urine:500]  Physical Exam: Patient is awake, A/O X 4, conversant, and in good spirits. They are in NAD and VSS. Doing well. Speech is fluent and appropriate. MAEW. Sensation to light touch is improving. PERLA, EOMI. CNs grossly intact. Dressing is intact with old drainage. Incision is well approximated with no drainage, erythema, or edema. Drain with 116ml of output overnight.     Lab Results: Recent Labs    04/03/21 0225 04/04/21 0340  WBC 10.6* 11.7*  HGB 12.8* 12.2*  HCT 37.1* 35.8*  PLT 205 224   BMET Recent Labs    04/02/21 2341 04/03/21 0225 04/04/21 0340  NA 136  --  133*  K 4.2  --  3.8  CL  --   --  101  CO2  --   --  25  GLUCOSE  --   --  148*  BUN  --   --  17  CREATININE  --  0.92 0.69  CALCIUM  --   --  8.5*    Studies/Results: No results found.  Assessment/Plan: Patient is s/p T4-T6 with T5 transverse decompression, T3-T7 pedicle screw instrumentation. He is recovering well. He has appropriate incisional pain. Strength is improving. Sensation is improving. Continue drain. Continue pain management. Continue PT/OT.    LOS: 3 days     Marvis Moeller, DNP, NP-C 04/05/2021, 8:36 AM

## 2021-04-05 NOTE — Progress Notes (Signed)
Nutrition Follow-up  DOCUMENTATION CODES:   Underweight  INTERVENTION:   -Continue Boost Plus TID, each supplement provides 360 kcals and 14 grams protein -MVI with minerals daily -Hormel Shake TID with meals, each supplement provides 520 kcals and 22 grams protein  NUTRITION DIAGNOSIS:   Inadequate oral intake related to dysphagia as evidenced by per patient/family report.  Ongoing  GOAL:   Patient will meet greater than or equal to 90% of their needs  Progressing   MONITOR:   PO intake, Supplement acceptance, Diet advancement, Labs, Weight trends, Skin, I & O's  REASON FOR ASSESSMENT:   Malnutrition Screening Tool    ASSESSMENT:   Bradley Oliver. Bradley Oliver is a 65 y.o. 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  D-dimer elevated but CT angiogram chest negative for PE; showing medial left lung mass extending to the mediastinum.  Moderate partially loculated left pleural effusion, trace right pleural effusion.  Moderate pathologic compression fracture at T5.  MRI of C-spine showing no evidence of metastatic disease.  MRI of lumbar spine showing a 8 mm enhancing lesion within the inferior aspect of the L5 vertebral body suspicious for metastatic lesion.  MRI of thoracic spine showing large destructive and infiltrative metastatic deposit centered at the left paraspinous region with involvement of the T4-T7 vertebral bodies.  Associated epidural extension with severe spinal stenosis, cord compression, and cord signal changes at the levels of T4-5 through T6-7.  Metastatic lesion extends to involve the adjacent posterior medial aspect of the left lung as well as the left adjacent mediastinum and left hilar/perihilar region.  Associated pathologic fracture  of T5.  1 cm enhancing lesion within the T11 vertebral body suspicious for metastatic lesion.  Neurosurgery consulted by ED physician  11/12- advanced to full liquid diet 11/14- advanced to regular diet   Reviewed I/O's: +826 ml x 24 hours and +1.8 L since admission  UOP: 1.6 L x 24 hours  Drain output: 120 ml x 24 hours   Pt unavailable at time of visit. Attempted to speak with pt via call to hospital room phone, however, unable to reach. RD unable to obtain further nutrition-related history or complete nutrition-focused physical exam at this time.    Pt just advanced to a regular diet this morning. Noted meal completions continue to be poor; PO: 25%. Per MD notes, pt amenable to swallow evaluation once transferred out of ICU. He agreed to Boost Plus supplements yesterday, which have been ordered and pt is consuming.   Medications reviewed and include decadron, dextrose 5%-0.45% sodium chloride infusion @ 75 ml/hr.   Labs reviewed: Na: 133.    Diet Order:   Diet Order             Diet regular Room service appropriate? Yes; Fluid consistency: Thin  Diet effective now                   EDUCATION NEEDS:   No education needs have been identified at this time  Skin:  Skin Assessment: Skin Integrity Issues: Skin Integrity Issues:: Stage I, Incisions Stage I: sacrum Incisions: closed back  Last BM:  Unknown  Height:   Ht Readings from Last 1 Encounters:  04/01/21 5\' 10"  (1.778 m)    Weight:   Wt Readings from Last 1 Encounters:  04/03/21 50  kg    Ideal Body Weight:  75.5 kg  BMI:  Body mass index is 15.82 kg/m.  Estimated Nutritional Needs:   Kcal:  2000-2200  Protein:  110-125 grams  Fluid:  > 2 L    Loistine Chance, RD, LDN, [place] Registered Dietitian II Certified Diabetes Care and Education Specialist Please refer to [employer] for RD and/or RD on-call/weekend/after hours pager

## 2021-04-05 NOTE — Progress Notes (Signed)
Physical Therapy Treatment Patient Details Name: Bradley Oliver. [place] MRN: [id number] DOB: [birth year] Today's Date: 04/05/2021   History of Present Illness 65 y/o male presented to [facility] ED on 04/01/21 following fall due to weakness/numbness of bilateral LE weakness. MRI T-spine showed large destructive and infiltrative metastatic deposit centered at the L paraspinous region involvement of T4-T7 vertebral bodies, associated epidural extension with severe spinal stenosis, cord compression and cord signal changes at levels of T4-7, metatstatic lesions of L lung, associated pathologic fx of T5, and T11 vertebral body enhancing lesion. MRI L-spine showed 8 mm enhancing lesion within inferior aspect of L5 vertebral body suspicious for metastatic lesion. S/p T3-7 fusion, T4-6 laminectomy, T5 transpedicular decompresion on 11/11. 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    PT Comments    Pt is progressing well with mobility, stood EOB multiple times with RW and one person assist today for several minutes each time.  I would like to attempt to progress gait away from the bed, however, it will be safer with a second person as pt is very ataxic.  PT will continue to follow acutely for safe mobility progression.  Pt would be appropriate for intensive CIR level therapies at d/c.   Recommendations for follow up therapy are one component of a multi-disciplinary discharge planning process, led by the attending physician.  Recommendations may be updated based on patient status, additional functional criteria and insurance authorization.  Follow Up Recommendations  Acute inpatient rehab (3hours/day)     Assistance Recommended at Discharge Intermittent Supervision/Assistance  Equipment Recommendations  Rolling walker (2 wheels);BSC/3in1;Hospital bed;Wheelchair (measurements PT);Wheelchair cushion (measurements PT)    Recommendations  for Other Services Rehab consult     Precautions / Restrictions Precautions Precautions: Back Precaution Comments: hemovac     Mobility  Bed Mobility Overal bed mobility: Needs Assistance Bed Mobility: Rolling;Sidelying to Sit;Sit to Sidelying Rolling: Min assist Sidelying to sit: Mod assist     Sit to sidelying: Mod assist General bed mobility comments: Mod assist to help lift trunk from left side lying to sit up and mod assist to lift both legs back into bed to go back to side lying and then supine. Pt with painful transitions.    Transfers Overall transfer level: Needs assistance Equipment used: Rolling walker (2 wheels) Transfers: Sit to/from Stand Sit to Stand: Mod assist;From elevated surface           General transfer comment: Mod assist to stand multiple times (3) from elevated bed, with L knee blocked by PT, able to weight shift a bit, but very uncoordinated with foot placement when attempting to step.  Would need second person to be safe to take steps away from the bed.    Ambulation/Gait                   Stairs             Wheelchair Mobility    Modified Rankin (Stroke Patients Only)       Balance Overall balance assessment: Needs assistance Sitting-balance support: Feet supported;Bilateral upper extremity supported Sitting balance-Leahy Scale: Fair     Standing balance support: Bilateral upper extremity supported Standing balance-Leahy Scale: Poor Standing balance comment: needs support from RW and PT, total assist for peri care in standing and in bed.  Pt was aware he was going, which shows good sensation.  Able to stand 2-3 minutes with each attempt until pain got worse and he  needed a break.  Working on weight shifting, extended posture.                            Cognition Arousal/Alertness: Awake/alert Behavior During Therapy: WFL for tasks assessed/performed Overall Cognitive Status: No family/caregiver present to  determine baseline cognitive functioning                                 General Comments: pt generally alert and oriented today.  In ok spirits given his situation.  Follows commands and directs his care well.        Exercises Other Exercises Other Exercises: pt reports doing ankle pumps, heel slides regularly in the bed.    General Comments        Pertinent Vitals/Pain Pain Assessment: Faces Faces Pain Scale: Hurts whole lot Pain Location: incisional and R lower scapula (where the hemovac is coming out) Pain Descriptors / Indicators: Guarding;Grimacing Pain Intervention(s): Limited activity within patient's tolerance;Monitored during session;Repositioned    Home Living                          Prior Function            PT Goals (current goals can now be found in the care plan section) Acute Rehab PT Goals Patient Stated Goal: to go to inpatient rehab and get better as much better as he can PT Goal Formulation: With patient Progress towards PT goals: Progressing toward goals    Frequency    Min 4X/week      PT Plan Discharge plan needs to be updated    Co-evaluation              AM-PAC PT "6 Clicks" Mobility   Outcome Measure  Help needed turning from your back to your side while in a flat bed without using bedrails?: A Little Help needed moving from lying on your back to sitting on the side of a flat bed without using bedrails?: A Lot Help needed moving to and from a bed to a chair (including a wheelchair)?: A Lot Help needed standing up from a chair using your arms (e.g., wheelchair or bedside chair)?: A Lot Help needed to walk in hospital room?: Total Help needed climbing 3-5 steps with a railing? : Total 6 Click Score: 11    End of Session Equipment Utilized During Treatment: Gait belt Activity Tolerance: Patient limited by pain Patient left: in bed;with call bell/phone within reach Nurse Communication: Other (comment)  (RN in room assisting with mobility) PT Visit Diagnosis: Unsteadiness on feet (R26.81);Muscle weakness (generalized) (M62.81);History of falling (Z91.81);Difficulty in walking, not elsewhere classified (R26.2)     Time: [phone] (did not charge extra unit for social talking pt/PT) PT Time Calculation (min) (ACUTE ONLY): 53 min  Charges:  $Therapeutic Activity: 38-52 mins                    Verdene Lennert, PT, DPT  Acute Rehabilitation Ortho Tech Supervisor [phone] pager [phone]) [phone] office

## 2021-04-06 DIAGNOSIS — M8458XA Pathological fracture in neoplastic disease, other specified site, initial encounter for fracture: Secondary | ICD-10-CM | POA: Diagnosis not present

## 2021-04-06 DIAGNOSIS — C7949 Secondary malignant neoplasm of other parts of nervous system: Secondary | ICD-10-CM | POA: Diagnosis not present

## 2021-04-06 DIAGNOSIS — R7989 Other specified abnormal findings of blood chemistry: Secondary | ICD-10-CM | POA: Diagnosis not present

## 2021-04-06 DIAGNOSIS — G9520 Unspecified cord compression: Secondary | ICD-10-CM | POA: Diagnosis not present

## 2021-04-06 LAB — TYPE AND SCREEN
ABO/RH(D): O POS
Antibody Screen: NEGATIVE
Unit division: 0
Unit division: 0
Unit division: 0
Unit division: 0

## 2021-04-06 LAB — BPAM RBC
Blood Product Expiration Date: 202212052359
Blood Product Expiration Date: 202212052359
Blood Product Expiration Date: 202212052359
Blood Product Expiration Date: 202212052359
ISSUE DATE / TIME: 202211112353
ISSUE DATE / TIME: 202211112353
ISSUE DATE / TIME: 202211112353
ISSUE DATE / TIME: 202211112353
Unit Type and Rh: 5100
Unit Type and Rh: 5100
Unit Type and Rh: 5100
Unit Type and Rh: 5100

## 2021-04-06 LAB — SURGICAL PATHOLOGY

## 2021-04-06 NOTE — Plan of Care (Signed)
  Problem: Education: Goal: Knowledge of General Education information will improve Description: Including pain rating scale, medication(s)/side effects and non-pharmacologic comfort measures 04/06/2021 1251 by Trixie Deis, RN Outcome: Progressing 04/06/2021 1250 by Trixie Deis, RN Outcome: Progressing   Problem: Activity: Goal: Risk for activity intolerance will decrease Outcome: Progressing   Problem: Pain Managment: Goal: General experience of comfort will improve Outcome: Progressing   Problem: Safety: Goal: Ability to remain free from injury will improve Outcome: Progressing   Problem: Skin Integrity: Goal: Risk for impaired skin integrity will decrease Outcome: Progressing

## 2021-04-06 NOTE — Progress Notes (Signed)
Occupational Therapy Treatment Patient Details Name: Bradley Oliver. [place] MRN: [id number] DOB: 01-28-1956 Today's Date: 04/06/2021   History of present illness 65 y/o male presented to [facility] ED on 04/01/21 following fall due to weakness/numbness of bilateral LE weakness. MRI T-spine showed large destructive and infiltrative metastatic deposit centered at the L paraspinous region involvement of T4-T7 vertebral bodies, associated epidural extension with severe spinal stenosis, cord compression and cord signal changes at levels of T4-7, metatstatic lesions of L lung, associated pathologic fx of T5, and T11 vertebral body enhancing lesion. MRI L-spine showed 8 mm enhancing lesion within inferior aspect of L5 vertebral body suspicious for metastatic lesion. S/p T3-7 fusion, T4-6 laminectomy, T5 transpedicular decompresion on 11/11. 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   OT comments  Pt expressing frustration with d/c options and insurance coverage. Pt tangential and states "im really pissed off now. Wait you will see what I can do now!" Pt reports decreased sensation in feet calling them "ghost feet" and peri area for hygiene. Pt noted to have poor balance with transfers and reliant on RW for static standing. Pt is able to hold a full static stand for several minutes. Recommendation for CIr at this time. If patient choose to go home at any point recommending home aide and max out services.    Recommendations for follow up therapy are one component of a multi-disciplinary discharge planning process, led by the attending physician.  Recommendations may be updated based on patient status, additional functional criteria and insurance authorization.    Follow Up Recommendations  Acute inpatient rehab (3hours/day)    Assistance Recommended at Discharge Intermittent Supervision/Assistance  Equipment Recommendations   BSC/3in1;Wheelchair (measurements OT);Wheelchair cushion (measurements OT);Hospital bed    Recommendations for Other Services Other (comment)    Precautions / Restrictions Precautions Precautions: Back Precaution Booklet Issued: No Precaution Comments: hemovac Other Brace: discussed functional implications of back precautions related to his in bed exercises.       Mobility Bed Mobility Overal bed mobility: Needs Assistance Bed Mobility: Rolling;Supine to Sit;Sit to Supine Rolling: Min guard   Supine to sit: Min guard;HOB elevated Sit to supine: Min assist;HOB elevated   General bed mobility comments: pt requires use of bed rail. hob increased for trunk support and increased time. pt with mod cues for lines / leads safety    Transfers Overall transfer level: Needs assistance Equipment used: Rolling walker (2 wheels) Transfers: Sit to/from Stand Sit to Stand: Min assist;Min guard;From elevated surface;+2 safety/equipment           General transfer comment: pt attempting to stand from bed from chair position and unable to complete despite two tries. Pt after failed attempt allowed to increase bed surface to mod ()A sit<>Stand. pt pull in on RW in a flexed posture and insisting to complete alone. pt does allow OT to touch gait belt for safety. pt with immediate LOB posterior first attempt. Pt completed x6 sit<>Stand     Balance Overall balance assessment: Needs assistance Sitting-balance support: Feet supported;No upper extremity supported;Bilateral upper extremity supported Sitting balance-Leahy Scale: Fair     Standing balance support: Bilateral upper extremity supported;During functional activity;Reliant on assistive device for balance Standing balance-Leahy Scale: Poor Standing balance comment: highly reliant on support from RW.  Min assist in standing to stabilize RW and support pt's trunk for balacne.  Pt stood several times at EOB >10 mins total for pericare, bed  change.  When offered  he was too tired to get OOB to chair and sit up longer.                           ADL either performed or assessed with clinical judgement   ADL Overall ADL's : Needs assistance/impaired                             Toileting- Clothing Manipulation and Hygiene: Total assistance Toileting - Clothing Manipulation Details (indicate cue type and reason): static standing x6 for continued peri care each time. pt with very thick stool that is sticking to skin and required extended time to remove from skin.            Extremity/Trunk Assessment Upper Extremity Assessment LUE Deficits / Details: shaking with static standing able to maintain grasp on RW   Lower Extremity Assessment LLE Deficits / Details: widen base of support feet turned out to widen further. dependent on UB support. Shaking with standing attempts due to fatigue        Vision       Perception     Praxis      Cognition Arousal/Alertness: Awake/alert Behavior During Therapy: Agitated Overall Cognitive Status: No family/caregiver present to determine baseline cognitive functioning Area of Impairment: Problem solving;Safety/judgement                         Safety/Judgement: Decreased awareness of deficits;Decreased awareness of safety Awareness: Emergent Problem Solving: Difficulty sequencing General Comments: pt very worked up due to a prior CIR discussion regarding insurance and approval pending. pt tangential about father being in facility and on and on about details that are not related to his current status. pt redirected several times back to top. pt expressed not having enough (A) for CIR d/c plan but refusing SNF and yet states he needs care to d/c home. pt just continuing to state the same information. pt direct to focus on activity that can be accomplished today such as therapy. pt incontinence of stool and aware but not calling for anyone to complete  hyigene. pt state I go any time i do anything due to this so whats the point.          Exercises Other Exercises Other Exercises: Pt and PT discsussed at length appropriate resistive exercises (no UE right now) with legs and provided with lightest theraband to preform alternating resistive knee and hip flexion, hip abduction, pillow adduction squeezes (plus glute sets).   Shoulder Instructions       General Comments VSS    Pertinent Vitals/ Pain       Pain Assessment: Faces Faces Pain Scale: Hurts even more Pain Location: scapula R / back pain Pain Descriptors / Indicators: Guarding;Grimacing Pain Intervention(s): Monitored during session;Premedicated before session;Repositioned  Home Living                                          Prior Functioning/Environment              Frequency  Min 2X/week        Progress Toward Goals  OT Goals(current goals can now be found in the care plan section)  Progress towards OT goals: Progressing toward goals  Acute Rehab OT Goals Patient Stated Goal: to get  to where i can get up and down without anyone helping me OT Goal Formulation: With patient Potential to Achieve Goals: Fair ADL Goals Pt Will Perform Grooming: with set-up;bed level Pt Will Transfer to Toilet: with +2 assist;with max assist;stand pivot transfer;bedside commode Additional ADL Goal #1: pt will complete sit<>stand min (A) with RW as precursor to adls Additional ADL Goal #2: pt will complete basic transfer total +2 mod (A) as precursor to adls Additional ADL Goal #3: pt will static sit min guard (A) as precursor to basic transfer  Plan Discharge plan needs to be updated    Co-evaluation      Reason for Co-Treatment: Complexity of the patient's impairments (multi-system involvement);Necessary to address cognition/behavior during functional activity;For patient/therapist safety;To address functional/ADL transfers PT goals addressed during  session: Mobility/safety with mobility;Balance;Strengthening/ROM;Proper use of DME        AM-PAC OT "6 Clicks" Daily Activity     Outcome Measure   Help from another person eating meals?: A Little Help from another person taking care of personal grooming?: A Little Help from another person toileting, which includes using toliet, bedpan, or urinal?: A Lot Help from another person bathing (including washing, rinsing, drying)?: A Lot Help from another person to put on and taking off regular upper body clothing?: A Little Help from another person to put on and taking off regular lower body clothing?: A Lot 6 Click Score: 15    End of Session Equipment Utilized During Treatment: Gait belt;Rolling walker (2 wheels)  OT Visit Diagnosis: Unsteadiness on feet (R26.81);Muscle weakness (generalized) (M62.81);Pain   Activity Tolerance Patient tolerated treatment well   Patient Left in bed;with call bell/phone within reach;with bed alarm set   Nurse Communication Mobility status;Precautions;Need for lift equipment        Time: [phone] OT Time Calculation (min): 44 min  Charges: OT General Charges $OT Visit: 1 Visit OT Treatments $Self Care/Home Management : 38-52 mins   Brynn, OTR/L  Acute Rehabilitation Services Pager: [phone] Office: [phone] .   Jeri Modena 04/06/2021, 5:11 PM

## 2021-04-06 NOTE — Progress Notes (Signed)
Subjective: Patient reports that he is continuing to recover well and notes improvement in his BLE strength and bilateral hand paraesthesia. He has appropriate incisional pain. No acute events overnight.   Objective: Vital signs in last 24 hours: Temp:  [98 F (36.7 C)-99 F (37.2 C)] 99 F (37.2 C) (11/15 0500) Pulse Rate:  [67-68] 67 (11/15 0500) Resp:  [15] 15 (11/14 2000) BP: (106-128)/(68-85) 128/77 (11/15 0500) SpO2:  [94 %-99 %] 94 % (11/15 0500)  Intake/Output from previous day: 11/14 0701 - 11/15 0700 In: 2967.2 [P.O.:320; I.V.:2647.2] Out: 2465 [Urine:2350; Drains:115] Intake/Output this shift: No intake/output data recorded.  Physical Exam: Patient is awake, A/O X 4, conversant, and in good spirits. He is in NAD and VSS. Speech is fluent and appropriate. MAEW. Sensation to light touch is improving, BUE > BLE. PERLA, EOMI. CNs grossly intact. Dressing is intact with old drainage. Incision is well approximated with no drainage, erythema, or edema. Drain with 115 ml of output over last 24 hours.   Lab Results: Recent Labs    04/04/21 0340  WBC 11.7*  HGB 12.2*  HCT 35.8*  PLT 224   BMET Recent Labs    04/04/21 0340  NA 133*  K 3.8  CL 101  CO2 25  GLUCOSE 148*  BUN 17  CREATININE 0.69  CALCIUM 8.5*    Studies/Results: No results found.  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 Patient is s/p T4-T6 with T5 transverse decompression, T3-T7 pedicle screw instrumentation on 04/03/2021. He is continuing to recover well. He has appropriate incisional pain that is exacerbated with movement. Strength is continuing to improve. Sensation is improving overall, however, his bilateral feet continue to be his biggest complaint. Continue drain. Will likely remove drain tomorrow. Continue pain management. Continue to encourage mobilization and working with PT/OT.      LOS: 4 days    Marvis Moeller, DNP,  NP-C 04/06/2021, 8:08 AM

## 2021-04-06 NOTE — Progress Notes (Signed)
Physical Therapy Treatment Patient Details Name: Breanna Mcdaniel. Rabinovich MRN: [id number] DOB: Nov 06, 1955 Today's Date: 04/06/2021   History of Present Illness 65 y/o male presented to [facility] ED on 04/01/21 following fall due to weakness/numbness of bilateral LE weakness. MRI T-spine showed large destructive and infiltrative metastatic deposit centered at the L paraspinous region involvement of T4-T7 vertebral bodies, associated epidural extension with severe spinal stenosis, cord compression and cord signal changes at levels of T4-7, metatstatic lesions of L lung, associated pathologic fx of T5, and T11 vertebral body enhancing lesion. MRI L-spine showed 8 mm enhancing lesion within inferior aspect of L5 vertebral body suspicious for metastatic lesion. S/p T3-7 fusion, T4-6 laminectomy, T5 transpedicular decompresion on 11/11. 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    PT Comments    PT/OT co-session focusing on safety and endurance in standing EOB for pericare.  Pt is min to mod assist for EOB mobility and static standing, however, cannot assist in pericare, second person needed for safety in standing.  Pt frustrated early in the session, but rapport from this PT helped calm him and focus his thoughts on getting better.  PT will continue to follow acutely for safe mobility progression.  Recommendations for follow up therapy are one component of a multi-disciplinary discharge planning process, led by the attending physician.  Recommendations may be updated based on patient status, additional functional criteria and insurance authorization.  Follow Up Recommendations  Acute inpatient rehab (3hours/day)     Assistance Recommended at Discharge Intermittent Supervision/Assistance  Equipment Recommendations  Rolling walker (2 wheels);BSC/3in1;Hospital bed;Wheelchair (measurements PT);Wheelchair cushion (measurements PT)     Recommendations for Other Services Rehab consult     Precautions / Restrictions Precautions Precautions: Back;Other (comment) Precaution Booklet Issued: No Precaution Comments: hemovac Other Brace: discussed functional implications of back precautions related to his in bed exercises.     Mobility  Bed Mobility Overal bed mobility: Needs Assistance Bed Mobility: Supine to Sit       Sit to supine: Mod assist;+2 for physical assistance;HOB elevated   General bed mobility comments: Two person light mod assist to help pt position back into bed by supporting hips and trunk as well as bil legs.    Transfers Overall transfer level: Needs assistance   Transfers: Sit to/from Stand Sit to Stand: Min assist;Min guard;From elevated surface;+2 safety/equipment           General transfer comment: Two person assist for safety, min to min guard assist for safety and transitions from elevated bed, pt with uncontrolled and uncoordinated transitions.    Ambulation/Gait                   Stairs             Wheelchair Mobility    Modified Rankin (Stroke Patients Only)       Balance Overall balance assessment: Needs assistance Sitting-balance support: Feet supported;No upper extremity supported;Bilateral upper extremity supported Sitting balance-Leahy Scale: Fair     Standing balance support: Bilateral upper extremity supported Standing balance-Leahy Scale: Poor Standing balance comment: highly reliant on support from RW.  Min assist in standing to stabilize RW and support pt's trunk for balacne.  Pt stood several times at EOB >10 mins total for pericare, bed change.  When offered he was too tired to get OOB to chair and sit up longer.  Cognition Arousal/Alertness: Awake/alert Behavior During Therapy: Agitated;Impulsive Overall Cognitive Status: No family/caregiver present to determine baseline cognitive functioning Area of  Impairment: Safety/judgement;Awareness                         Safety/Judgement: Decreased awareness of deficits;Decreased awareness of safety Awareness: Emergent   General Comments: Pt was very frustrated, agitated with current situation.  He was cussing, but not aggressive.  With some time and even keel approach he was able to calm down and re-direct.        Exercises Other Exercises Other Exercises: Pt and PT discsussed at length appropriate resistive exercises (no UE right now) with legs and provided with lightest theraband to preform alternating resistive knee and hip flexion, hip abduction, pillow adduction squeezes (plus glute sets).    General Comments General comments (skin integrity, edema, etc.): Discussed therapy progression and next steps are attempting short distance gait with RW and getting OOB to the recliner chair even for short periods of time to increase tolerance to being upright and out of bed from a pain standpoint and from a cardiopulmonary standpoint as well.      Pertinent Vitals/Pain Pain Assessment: Faces Faces Pain Scale: Hurts even more Pain Location: incisional and R lower scapula (where the hemovac is coming out) Pain Descriptors / Indicators: Guarding;Grimacing Pain Intervention(s): Limited activity within patient's tolerance;Monitored during session;Repositioned    Home Living                          Prior Function            PT Goals (current goals can now be found in the care plan section) Acute Rehab PT Goals Patient Stated Goal: pt wants to be able to get about his home without help, he is ok with that being with a RW at first, but ultimately wants to be independent. Progress towards PT goals: Progressing toward goals    Frequency    Min 4X/week      PT Plan Current plan remains appropriate    Co-evaluation PT/OT/SLP Co-Evaluation/Treatment: Yes (overlapping session) Reason for Co-Treatment: Complexity of the  patient's impairments (multi-system involvement);Necessary to address cognition/behavior during functional activity;For patient/therapist safety;To address functional/ADL transfers PT goals addressed during session: Mobility/safety with mobility;Balance;Strengthening/ROM;Proper use of DME        AM-PAC PT "6 Clicks" Mobility   Outcome Measure  Help needed turning from your back to your side while in a flat bed without using bedrails?: A Little Help needed moving from lying on your back to sitting on the side of a flat bed without using bedrails?: A Lot Help needed moving to and from a bed to a chair (including a wheelchair)?: A Lot Help needed standing up from a chair using your arms (e.g., wheelchair or bedside chair)?: A Lot Help needed to walk in hospital room?: Total Help needed climbing 3-5 steps with a railing? : Total 6 Click Score: 11    End of Session Equipment Utilized During Treatment: Gait belt Activity Tolerance: Patient limited by pain Patient left: in bed;with call bell/phone within reach   PT Visit Diagnosis: Unsteadiness on feet (R26.81);Muscle weakness (generalized) (M62.81);History of falling (Z91.81);Difficulty in walking, not elsewhere classified (R26.2)     Time: [id number] (pt is verbose in conversation, did not charge for this time, divided co-session time as well) PT Time Calculation (min) (ACUTE ONLY): 56 min  Charges:  $Therapeutic Exercise: 8-22 mins $Therapeutic  Activity: 8-22 mins                    Verdene Lennert, PT, DPT  Acute Rehabilitation Ortho Tech Supervisor [phone] pager [phone] office

## 2021-04-06 NOTE — Progress Notes (Signed)
Inpatient Rehab Admissions Coordinator:    I spoke with pt. Regarding CIR and while he stated interest, he acknowledged that he has no physical support at home. His mother and sister are disabled and able to provide supervision, but that is all. I reviewed case with therapy director of [facility], and he did not feel that Pt. Would be able to reach supervision-mod I goals and be able to care for himself by the end of a relatively short CIR stay. Pt. Likely to need a longer rehab at a slower pace. Recommend TOC and Pt. Consider other venues for rehab as [facility] is unable to offer a bed.   Clemens Catholic, [place], [place] Admissions Coordinator  [phone] ([place]) [phone] (office)

## 2021-04-06 NOTE — Progress Notes (Addendum)
HEMATOLOGY-ONCOLOGY PROGRESS NOTE  SUBJECTIVE: Pain improved.  He is having improvement in his bilateral lower extremity strength and paresthesias in his hand.  PHYSICAL EXAMINATION:  Vitals:   04/06/21 0000 04/06/21 0500  BP:  128/77  Pulse:  67  Resp:    Temp: 98.1 F (36.7 C) 99 F (37.2 C)  SpO2:  94%   Filed Weights   04/01/21 1652 04/03/21 0200  Weight: 49.9 kg 50 kg    Intake/Output from previous day: 11/14 0701 - 11/15 0700 In: 2967.2 [P.O.:320; I.V.:2647.2] Out: 2465 [Urine:2350; Drains:115]  GENERAL:alert, no distress and comfortable SKIN: skin color, texture, turgor are normal, no rashes or significant lesions EYES: normal, Conjunctiva are pink and non-injected, sclera clear OROPHARYNX:no exudate, no erythema and lips, buccal mucosa, and tongue normal  LUNGS: clear to auscultation and percussion with normal breathing effort HEART: regular rate & rhythm and no murmurs and no lower extremity edema  MSK: Strength in bilateral extremities 4/5. NEURO: Alert and oriented x3  LABORATORY DATA:  I have reviewed the data as listed CMP Latest Ref Rng & Units 04/04/2021 04/03/2021 04/02/2021  Glucose 70 - 99 mg/dL 148(H) - -  BUN 8 - 23 mg/dL 17 - -  Creatinine 0.61 - 1.24 mg/dL 0.69 0.92 -  Sodium 135 - 145 mmol/L 133(L) - 136  Potassium 3.5 - 5.1 mmol/L 3.8 - 4.2  Chloride 98 - 111 mmol/L 101 - -  CO2 22 - 32 mmol/L 25 - -  Calcium 8.9 - 10.3 mg/dL 8.5(L) - -  Total Protein 6.5 - 8.1 g/dL 5.2(L) - -  Total Bilirubin 0.3 - 1.2 mg/dL 0.8 - -  Alkaline Phos 38 - 126 U/L 58 - -  AST 15 - 41 U/L 31 - -  ALT 0 - 44 U/L 11 - -    Lab Results  Component Value Date   WBC 11.7 (H) 04/04/2021   HGB 12.2 (L) 04/04/2021   HCT 35.8 (L) 04/04/2021   MCV 85.6 04/04/2021   PLT 224 04/04/2021   NEUTROABS 5.4 04/01/2021    CT Angio Chest PE W and/or Wo Contrast  Result Date: 04/02/2021 CLINICAL DATA:  Weakness, T4-6 tumor, history of right tonsillar cancer, elevated  D-dimer, evaluate for PE EXAM: CT ANGIOGRAPHY CHEST WITH CONTRAST TECHNIQUE: Multidetector CT imaging of the chest was performed using the standard protocol during bolus administration of intravenous contrast. Multiplanar CT image reconstructions and MIPs were obtained to evaluate the vascular anatomy. CONTRAST:  68m OMNIPAQUE IOHEXOL 350 MG/ML SOLN COMPARISON:  None. FINDINGS: Cardiovascular: Satisfactory opacification of the bilateral pulmonary arteries to the lobar level. Narrowing of the left main pulmonary artery due to tumor. No evidence of pulmonary embolism. Although not tailored for evaluation of the thoracic aorta, there is no evidence thoracic aortic aneurysm. Mild atherosclerotic calcifications. The heart is normal in size.  No pericardial effusion. Mediastinum/Nodes: Tumor along the anterior mediastinum/AP window and extending to the medial lung. This will be described below. Visualized thyroid is unremarkable. Lungs/Pleura: Mass involving the medial aspect of the left upper lobe and superior segment left lower lobe, extending to the mediastinum, measuring 14.1 x 4.7 cm (series 6/image 93). Moderate left pleural effusion, partially loculated, with associated apical capping. Associated left lower lobe opacity, likely compressive atelectasis. Trace right pleural effusion. 9 mm nodule in the superior segment right lower lobe (series 7/image 36). No pneumothorax. Upper Abdomen: Visualized upper abdomen is grossly unremarkable. Musculoskeletal: Moderate compression fracture at T5, pathologic. Total spine MRI has been performed and  further evaluation will be deferred to that study. Otherwise, no focal osseous lesions. Review of the MIP images confirms the above findings. IMPRESSION: No evidence of pulmonary embolism. 14.1 x 4.7 cm mass involving the medial left lung and extending to the mediastinum, as above. Moderate left pleural effusion, partially loculated, with apical capping. Trace right pleural  effusion. Moderate pathologic compression fracture at T5. Additional spinal involvement will be described on dedicated total spine MR, reported separately. Aortic Atherosclerosis (ICD10-I70.0). Electronically Signed   By: Julian Hy M.D.   On: 04/02/2021 01:31   MR Cervical Spine W or Wo Contrast  Result Date: 04/02/2021 CLINICAL DATA:  Initial evaluation for metastatic disease evaluation. EXAM: MRI CERVICAL SPINE WITHOUT AND WITH CONTRAST TECHNIQUE: Multiplanar and multiecho pulse sequences of the cervical spine, to include the craniocervical junction and cervicothoracic junction, were obtained without and with intravenous contrast. CONTRAST:  5.34m GADAVIST GADOBUTROL 1 MMOL/ML IV SOLN COMPARISON:  None. FINDINGS: Alignment: Physiologic with preservation of the normal cervical lordosis. No listhesis. Vertebrae: Vertebral body height maintained without acute or chronic fracture. Bone marrow signal intensity within normal limits. No discrete osseous lesions or evidence for metastatic disease. No abnormal edema or enhancement. Cord: Normal signal morphology. Posterior Fossa, vertebral arteries, paraspinal tissues: Visualized brain and posterior fossa within normal limits. Craniocervical junction normal. Paraspinous soft tissues within normal limits. Normal flow voids seen within the vertebral arteries bilaterally. Disc levels: C2-C3: Right paracentral disc protrusion indents the right ventral thecal sac. No significant spinal stenosis or cord impingement. Foramina remain patent. C3-C4: Right paracentral disc protrusion indents the right ventral thecal sac. No significant spinal stenosis or cord impingement. Foramina remain patent. C4-C5: Mild degenerative intervertebral disc space narrowing. Superimposed right paracentral disc protrusion indents the ventral thecal sac, contacting and minimally flattening the ventral cord. Thecal sac remains widely patent without significant spinal stenosis. Bilateral  uncovertebral spurring with resultant mild right C5 foraminal stenosis. Left neural foramen remains patent. C5-C6: Mild degenerative intervertebral disc space narrowing. Right eccentric disc bulge with associated right greater than left uncovertebral spurring. Broad posterior disc osteophyte mildly flattens the ventral thecal sac without significant spinal stenosis. Moderate right C6 foraminal narrowing. Left neural foramina remains patent. C6-C7: Mild degenerative intervertebral disc space narrowing. Right eccentric disc osteophyte complex mildly flattens the ventral thecal sac without significant spinal stenosis. Mild to moderate right C7 foraminal narrowing. Left neural foramen remains patent. C7-T1: Minimal disc bulge. Mild facet hypertrophy. No spinal stenosis. Foramina remain patent. IMPRESSION: 1. No evidence for metastatic disease within the cervical spine. 2. Small right paracentral disc protrusions at C2-3 through C4-5 without significant spinal stenosis. 3. Right eccentric disc osteophyte complexes at C5-6 and C6-7 with resultant mild to moderate right C6 and C7 foraminal stenosis as above. Electronically Signed   By: BJeannine BogaM.D.   On: 04/02/2021 01:05   MR THORACIC SPINE W WO CONTRAST  Result Date: 04/02/2021 CLINICAL DATA:  Initial metastatic evaluation. EXAM: MRI THORACIC WITHOUT AND WITH CONTRAST TECHNIQUE: Multiplanar and multiecho pulse sequences of the thoracic spine were obtained without and with intravenous contrast. CONTRAST:  5.570mGADAVIST GADOBUTROL 1 MMOL/ML IV SOLN COMPARISON:  None available. FINDINGS: Alignment: Dextroscoliosis. Focal kyphotic angulation of the thoracic spine at the level of T5. No listhesis. Vertebrae: Large contiguous infiltrative and destructive metastatic deposit seen involving the upper-midthoracic spine. Lesion involves the T4 through T7 vertebral bodies, and is centered at the left paraspinous region. Although measurements are somewhat difficult,  lesion measures approximately 7.7 x 8.4 x  10.9 cm in greatest dimensions (AP by transverse by craniocaudad). Lesion involves the left hilar/perihilar region, tracking posteriorly along the posteromedial left lung, involving the adjacent left mediastinum and partially encasing the aorta. Direct extension of this mass into the adjacent posterior ribs and T4 through T7 vertebral bodies, with involvement of their posterior elements at several levels. There is an associated pathologic fracture of T5 with approximately 60% height loss. Epidural extension of tumor to involve the spinal canal, extending from T3 through T7. This is most pronounced at the level of T5 where there is circumferential epidural involvement. Associated severe spinal stenosis at the level of T5 with cord compression and associated cord signal changes (series 17, image 8). Thecal sac measures 4 mm in AP diameter at its most narrow point. Tumor also extends to involve the left neural foramina at T4-5 through T6-7, with probable early extension into the left T3-4 and T7-8 foramina. Involvement of the right T4-5 and T6-7 neural foramina noted as well. There is an additional 1 cm T1 hypointense enhancing lesion within the T11 vertebral body, indeterminate, but could reflect an additional metastatic lesion (series 19, image 7). Otherwise, no other metastatic disease seen within the thoracic spine. Multiple scattered benign hemangiomata noted. No other acute or chronic fracture. Cord: Epidural tumor with associated severe spinal stenosis and cord signal changes at T4-5 through T6-7 as above, most pronounced at the T5 level. Otherwise, signal intensity within the thoracic cord is within normal limits. No other epidural involvement. Paraspinal and other soft tissues: Metastatic lesion involving the left paraspinous and posteromedial left lung at T4 through T7 as above. Associated atelectasis with irregular and partially loculated left pleural effusion  partially visualized. Direct extension of tumor to involve the left posterior soft tissues of the left mid back noted as well. Disc levels: No significant underlying disc pathology for age. No other significant stenosis or impingement. IMPRESSION: 1. Large destructive and infiltrative metastatic deposit centered at the left paraspinous region, with involvement of the T4 through T7 vertebral bodies as above. Associated epidural extension with severe spinal stenosis, cord compression, and cord signal changes at the levels of T4-5 through T6-7. 2. Metastatic lesion extends to involve the adjacent posteromedial aspect of the left lung as well as the left adjacent mediastinum and left hilar/perihilar region, also seen on corresponding CTA of the chest. 3. Associated pathologic fracture of T5 with up to 60% height loss. 4. 1 cm enhancing lesion within the T11 vertebral body, indeterminate, but could reflect an additional metastatic lesion. Attention at follow-up recommended. Critical Value/emergent results were called by telephone at the time of interpretation on 04/02/2021 at 1:32 am to provider Dr. Florina Ou , who verbally acknowledged these results. Electronically Signed   By: Jeannine Boga M.D.   On: 04/02/2021 01:41   MR Lumbar Spine W Wo Contrast  Result Date: 04/02/2021 CLINICAL DATA:  Initial evaluation for metastatic evaluation. EXAM: MRI LUMBAR SPINE WITHOUT AND WITH CONTRAST TECHNIQUE: Multiplanar and multiecho pulse sequences of the lumbar spine were obtained without and with intravenous contrast. CONTRAST:  5.85m GADAVIST GADOBUTROL 1 MMOL/ML IV SOLN COMPARISON:  None available. FINDINGS: Segmentation: Standard. Lowest well-formed disc space labeled the L5-S1 level. Alignment: Physiologic with preservation of the normal lumbar lordosis. No listhesis. Vertebrae: Vertebral body height maintained without acute or chronic fracture. Prominent benign hemangioma noted within the T12 vertebral body. There is  a 8 mm T1 hypointense enhancing lesion within the inferior aspect of the L5 vertebral body, nonspecific, but could reflect  a small metastatic lesion (series 23, image 6). No other evidence for metastatic disease seen within the lumbar spine. Conus medullaris and cauda equina: Conus extends to the L1 level. Conus and cauda equina appear normal. Paraspinal and other soft tissues: Mild diffuse edema within the lower posterior paraspinous musculature, nonspecific, but could reflect muscular injury and/or strain. Soft tissues demonstrate no other acute finding. Prominent distension of the partially visualized urinary bladder. Visualized visceral structures otherwise unremarkable. Disc levels: L1-2:  Negative interspace.  Mild facet hypertrophy.  No stenosis. L2-3:  Negative interspace.  Mild facet hypertrophy.  No stenosis. L3-4:  Negative interspace.  Mild facet hypertrophy.  No stenosis. L4-5: Disc desiccation with minimal disc bulge. Superimposed tiny central disc protrusion with annular fissure. Mild facet hypertrophy. Mild narrowing of the lateral recesses. Central canal remains patent. No foraminal stenosis. L5-S1: Degenerative intervertebral disc space narrowing with disc desiccation and mild diffuse disc bulge. Mild reactive endplate spurring. Mild facet hypertrophy. No significant spinal stenosis. Foramina remain patent. IMPRESSION: 1. 8 mm enhancing lesion within the inferior aspect of the L5 vertebral body, nonspecific, but could reflect a small metastatic lesion. Attention at follow-up recommended. 2. No other evidence for metastatic disease within the lumbar spine. 3. Mild diffuse edema within the lower posterior paraspinous musculature, nonspecific, but could reflect muscular injury and/or strain. 4. Prominent distension of the visualized urinary bladder. Query urinary retention. 5. Mild for age spondylosis without significant stenosis. Electronically Signed   By: Jeannine Boga M.D.   On: 04/02/2021  01:51   [place] CHEST (PLEURAL EFFUSION)  Result Date: 04/02/2021 CLINICAL DATA:  Pleural effusion. EXAM: CHEST ULTRASOUND COMPARISON:  CTA Chest earlier same day FINDINGS: Small left pleural effusion.  No discernible right effusion. IMPRESSION: Small left pleural effusion. Inadequate volume for safe image guided thoracentesis. Electronically Signed   By: Misty Stanley M.D.   On: 04/02/2021 12:23   DG Thoracic Spine 1 View  Result Date: 04/03/2021 CLINICAL DATA:  T3-7 fusion EXAM: OPERATIVE THORACIC SPINE 1 VIEW(S) COMPARISON:  None. FLUOROSCOPY TIME:  36 seconds FINDINGS: Intraoperative fluoroscopic image during T3-7 fusion with pedicle screws. IMPRESSION: Intraoperative fluoroscopic images, as above. Electronically Signed   By: Julian Hy M.D.   On: 04/03/2021 02:56   DG Chest Portable 1 View  Result Date: 04/01/2021 CLINICAL DATA:  Chest tightness, tumor at T4-6 EXAM: PORTABLE CHEST 1 VIEW COMPARISON:  11/09/2017 FINDINGS: Left perihilar/mediastinal mass, progressive. Associated apical pleural thickening versus fluid, new. Right lung is clear.  No pneumothorax. The heart is normal in size. IMPRESSION: Left perihilar/mediastinal mass in this patient with known T4-6 tumor, progressive. Associated apical pleural thickening/versus fluid, new. Electronically Signed   By: Julian Hy M.D.   On: 04/01/2021 20:24   DG C-Arm 1-60 Min-No Report  Result Date: 04/03/2021 CLINICAL DATA:  T3-7 fusion EXAM: OPERATIVE THORACIC SPINE 1 VIEW(S) COMPARISON:  None. FLUOROSCOPY TIME:  36 seconds FINDINGS: Intraoperative fluoroscopic image during T3-7 fusion with pedicle screws. IMPRESSION: Intraoperative fluoroscopic images, as above. Electronically Signed   By: Julian Hy M.D.   On: 04/03/2021 02:56   DG C-Arm 1-60 Min-No Report  Result Date: 04/03/2021 CLINICAL DATA:  T3-7 fusion EXAM: OPERATIVE THORACIC SPINE 1 VIEW(S) COMPARISON:  None. FLUOROSCOPY TIME:  36 seconds FINDINGS: Intraoperative  fluoroscopic image during T3-7 fusion with pedicle screws. IMPRESSION: Intraoperative fluoroscopic images, as above. Electronically Signed   By: Julian Hy M.D.   On: 04/03/2021 02:56   VAS [place] LOWER EXTREMITY VENOUS (DVT)  Result Date: 04/02/2021  Lower Venous DVT Study Patient Name:  VERONICA GUERRANT. [facility]  Date of Exam:   04/02/2021 Medical Rec #: [id number]          Accession #:    [id number] Date of Birth: 1955/07/19          Patient Gender: M Patient Age:   65 years Exam Location:  [facility] Procedure:      VAS [place] LOWER EXTREMITY VENOUS (DVT) Referring Phys: Wandra Feinstein RATHORE --------------------------------------------------------------------------------  Indications: Elevated Ddimer.  Risk Factors: Cancer. Limitations: Patient positioning and body habitus. Comparison Study: No prior studies. Performing Technologist: Oliver Hum RVT  Examination Guidelines: A complete evaluation includes B-mode imaging, spectral Doppler, color Doppler, and power Doppler as needed of all accessible portions of each vessel. Bilateral testing is considered an integral part of a complete examination. Limited examinations for reoccurring indications may be performed as noted. The reflux portion of the exam is performed with the patient in reverse Trendelenburg.  +---------+---------------+---------+-----------+----------+--------------+ RIGHT    CompressibilityPhasicitySpontaneityPropertiesThrombus Aging +---------+---------------+---------+-----------+----------+--------------+ CFV      Full           Yes      Yes                                 +---------+---------------+---------+-----------+----------+--------------+ SFJ      Full                                                        +---------+---------------+---------+-----------+----------+--------------+ FV Prox  Full                                                         +---------+---------------+---------+-----------+----------+--------------+ FV Mid   Full                                                        +---------+---------------+---------+-----------+----------+--------------+ FV DistalFull                                                        +---------+---------------+---------+-----------+----------+--------------+ PFV      Full                                                        +---------+---------------+---------+-----------+----------+--------------+ POP      Full           Yes      Yes                                 +---------+---------------+---------+-----------+----------+--------------+ PTV  Full                                                        +---------+---------------+---------+-----------+----------+--------------+ PERO     Full                                                        +---------+---------------+---------+-----------+----------+--------------+   +---------+---------------+---------+-----------+----------+--------------+ LEFT     CompressibilityPhasicitySpontaneityPropertiesThrombus Aging +---------+---------------+---------+-----------+----------+--------------+ CFV      Full           Yes      Yes                                 +---------+---------------+---------+-----------+----------+--------------+ SFJ      Full                                                        +---------+---------------+---------+-----------+----------+--------------+ FV Prox  Full                                                        +---------+---------------+---------+-----------+----------+--------------+ FV Mid   Full                                                        +---------+---------------+---------+-----------+----------+--------------+ FV DistalFull                                                         +---------+---------------+---------+-----------+----------+--------------+ PFV      Full                                                        +---------+---------------+---------+-----------+----------+--------------+ POP      Full           Yes      Yes                                 +---------+---------------+---------+-----------+----------+--------------+ PTV      Full                                                        +---------+---------------+---------+-----------+----------+--------------+  PERO     Full                                                        +---------+---------------+---------+-----------+----------+--------------+     Summary: RIGHT: - There is no evidence of deep vein thrombosis in the lower extremity.  - No cystic structure found in the popliteal fossa.  LEFT: - There is no evidence of deep vein thrombosis in the lower extremity.  - No cystic structure found in the popliteal fossa.  *See table(s) above for measurements and observations. Electronically signed by Servando Snare MD on 04/02/2021 at 4:26:00 PM.    Final     ASSESSMENT AND PLAN: 1.  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, stage IVa (T2N2bM0) HPV positive 08/2014-09/2014-CRT 70 Gray in 35 fractions with cisplatin 20 mg per metered squared days 1-5, weeks 1 and 5 Recurrent disease April 2019-new left upper lobe cavitary lesion, infiltrative left hilar mass, prevascular and mediastinal adenopathy 09/28/2017-flexible bronchoscopy with EBUS-biopsy station 7 node, squamous cell carcinoma p16 positive 10/2017-12/2018-systemic therapy, carboplatin/docetaxel/pembrolizumab x5 followed by maintenance pembrolizumab, progression in 06/2018, 09/2018, and 12/2018 while on pembrolizumab-continue due to slow mild progression 07/13/2018-disease progression with new left upper lobe posterior segment lesion, new left upper lobe nodule, new  left apical nodularity with subsequent scans 10/05/2018 and 01/11/2019 confirming disease progression 10/30/2020-PET add [facility] posterior mediastinal mass involving T5 and T6 and left posterior fifth and sixth ribs, hypermetabolic soft tissue within the prevascular mediastinum contiguous with the posterior mediastinal mass, hypermetabolic spiculated left upper lobe nodules, small left pleural effusion, focal small bowel uptake in left upper quadrant abutting the transverse colon Leg weakness and lower body numbness beginning 03/24/2021, fall prior to presenting to the emergency room 04/01/2021 MRI cervical, thoracic, and lumbar spine level 04/02/2021-no evidence of metastatic disease of the cervical spine, 8 mm enhancing lesion in the inferior aspect of L5-nonspecific, no other evidence of metastatic disease in the lumbar spine, large destructive and infiltrative metastatic deposit centered at the left paraspinous region involving T4-T7 with associated epidural extension with severe spinal stenosis, cord compression, cord signal changes at T4-5 through T6-7, metastatic lesion extends to involve the posterior medial aspect of the left lung and left mediastinum/hilar region, pathologic fracture of T5, 1 cm enhancing lesion in T11 indeterminate   2.  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 Goedken appears improved.  He underwent T4-T6 decompression with T3-T7 pedicle screw instrumentation on 04/03/2021.  He has improvement in his lower extremity strength and upper extremity paresthesia.  Once the patient recovers from surgery, would recommend referral to radiation oncology for palliative radiation as an outpatient.  The patient has been seen in the past by providers at [facility] and he may follow-up for medical oncology with them.   However, if he wishes to stay local, we can arrange for outpatient follow-up at our cancer center.  Recommendations: 1.  Referral to radiation oncology as an outpatient for palliative radiation to his spine. 2.  Taper steroids per neurosurgery 3.  Medical oncology at [facility] if he prefers as he is already established there versus following up locally.  No  future appointments.    LOS: 4 days   Mikey Bussing, DNP, AGPCNP-BC, AOCNP 04/06/21  Mr. Tavano was interviewed and examined.  He underwent decompression surgery on 04/03/2021.  His neurologic status has improved.  Lower extremity numbness and weakness has improved.  He continues postoperative care per the medical and neurosurgical services.  He will complete palliative radiation to the remaining mediastinal/spine tumor when he has recovered from surgery.  He will plan to continue medical oncology care with his established providers at [facility].  I am available to see him here as needed.  I was present for greater than 50% of today's visit.  I performed medical decision making.

## 2021-04-06 NOTE — Plan of Care (Signed)

## 2021-04-06 NOTE — Progress Notes (Signed)
[facility]  PROGRESS NOTE  Bradley Oliver. 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  D-dimer was elevated, CTA chest was negative for PE.  Showed medial left lung mass extending to mediastinum.  MRI of lumbar spine showed 8 mm enhancing lesion within the inferior aspect of L5 vertebral body suspicious for metastatic lesion.  MRI of thoracic spine showed large destructive and infiltrative metastatic deposit centered at the left paraspinous region with involvement of T4-T7 vertebral bodies.  Associated epidural extension with severe spinal stenosis, cord compression and cord signal changes at the level of T4-5 through T6-7.     Metastatic lesion extends to involve the adjacent posterior medial aspect of the left lung as well as the left adjacent mediastinum and left hilar/perihilar region.  Associated pathologic fracture of T5.  1 cm enhancing lesion within the T11 vertebral body suspicious for metastatic lesion.  Neurosurgery consulted by ED physician.  Patient was transferred to [facility], underwent decompression T4-T6 with T5 transverse decompression, T3-T7 pedicle screw instrumentation.  Subjective   Patient seen and examined, denies any pain.  Strength in the lower extremities has improved.   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   -Speech therapy reevaluated swallowing yesterday.  Started him on regular diet -Started on D5 half-normal saline at 75 mill per hour -Continue boost 3 times daily for nutrition  Stage IV squamous cell carcinoma of right tonsil, HPV positive -With metastasis to lung and spine -PET/CT from 10/2020 also mention possible small bowel involvement -Previously underwent chemotherapy and radiation at [facility] -He also received immunotherapy at that time -Patient has been to multiple centers for treatment of HPV directed therapy -Oncology following  Left pleural effusion -Seen on CTA chest, appears partially loculated -Thoracentesis was attempted by IR but could not be performed as no fluid collection for safe approach was found   Elevated D-dimer -Likely from underlying cancer -CTA chest negative for PE -Venous duplex of lower extremities negative for DVT  Hypothyroidism -Continue Synthroid   Scheduled medications:    sodium chloride   Intravenous Once   Chlorhexidine Gluconate Cloth  6 each Topical Daily   dexamethasone (DECADRON) injection  4 mg Intravenous Q6H   docusate  100 mg Oral BID   heparin injection (subcutaneous)  5,000 Units Subcutaneous Q12H   lactose free nutrition  237 mL Oral TID WC   levothyroxine  50 mcg Oral Q0600   mouth rinse  15 mL Mouth Rinse BID   multivitamin with minerals  1 tablet Oral Daily   sodium chloride flush  3 mL Intravenous Q12H     Data Reviewed:   CBG:  Recent Labs  Lab 04/01/21 2002  GLUCAP 104*    SpO2: 96 %    Vitals:   04/06/21 0500 04/06/21 0800 04/06/21 1027 04/06/21 1200  BP: 128/77  123/84   Pulse: 67  68   Resp:  Temp: 99 F (37.2 C) 98.1 F (36.7 C)  98.8 F (37.1 C)  TempSrc: Oral Oral  Oral  SpO2: 94%  96%   Weight:      Height:         Intake/Output Summary (Last 24 hours) at 04/06/2021 1656 Last data filed at 04/06/2021 1200 Gross per 24  hour  Intake 1857.99 ml  Output 2000 ml  Net -142.01 ml    11/13 1901 - 11/15 0700 In: 3207.2 [P.O.:560; I.V.:2647.2] Out: 3115 [Urine:2950; Drains:165]  Filed Weights   04/01/21 1652 04/03/21 0200  Weight: 49.9 kg 50 kg    Data Reviewed: Basic Metabolic Panel: Recent Labs  Lab 04/01/21 1912 04/02/21 2341 04/03/21 0225 04/04/21 0340  NA 137 136  --  133*  K 3.4* 4.2  --  3.8  CL 101  --   --  101  CO2 27  --   --  25  GLUCOSE 97  --   --  148*  BUN 20  --   --  17  CREATININE 0.72  --  0.92 0.69  CALCIUM 9.3  --   --  8.5*   Liver Function Tests: Recent Labs  Lab 04/01/21 1912 04/04/21 0340  AST 27 31  ALT 11 11  ALKPHOS 82 58  BILITOT 1.2 0.8  PROT 7.2 5.2*  ALBUMIN 4.2 3.0*   No results for input(s): LIPASE, AMYLASE in the last 168 hours. No results for input(s): AMMONIA in the last 168 hours. CBC: Recent Labs  Lab 04/01/21 1912 04/02/21 2341 04/03/21 0225 04/04/21 0340  WBC 6.6  --  10.6* 11.7*  NEUTROABS 5.4  --   --   --   HGB 14.0 9.2* 12.8* 12.2*  HCT 40.9 27.0* 37.1* 35.8*  MCV 88.1  --  86.7 85.6  PLT 274  --  205 224   Cardiac Enzymes: No results for input(s): CKTOTAL, CKMB, CKMBINDEX, TROPONINI in the last 168 hours. BNP (last 3 results) No results for input(s): BNP in the last 8760 hours.  ProBNP (last 3 results) No results for input(s): PROBNP in the last 8760 hours.  CBG: Recent Labs  Lab 04/01/21 2002  GLUCAP 104*       Radiology Reports  No results found.     Antibiotics: Anti-infectives (From admission, onward)    Start     Dose/Rate Route Frequency Ordered Stop   04/03/21 0700  ceFAZolin (ANCEF) IVPB 2g/100 mL premix        2 g 200 mL/hr over 30 Minutes Intravenous Every 8 hours 04/03/21 0225 04/03/21 1730   04/03/21 0225  ceFAZolin (ANCEF) IVPB 2g/100 mL premix  Status:  Discontinued        2 g 200 mL/hr over 30 Minutes Intravenous 30 min pre-op 04/03/21 0225 04/04/21 0808         DVT prophylaxis:  SCDs  Code Status: DNR  Family Communication: No family at bedside   Consultants: Neurosurgery Oncology  Procedures: S/p decompression of metastatic spinal cord compression    Objective    Physical Examination:  General-appears in no acute distress Heart-S1-S2, regular, no murmur auscultated Lungs-clear to auscultation bilaterally, no wheezing or crackles auscultated Abdomen-soft, nontender, no organomegaly Extremities-no edema in the lower extremities Neuro-alert, oriented x3, motor strength 4/5 in both lower extremities, 5/5 in upper extremities, sensation intact throughout  Status is: Inpatient  Dispo: The patient is from: Home              Anticipated d/c is  to: Skilled nursing facility              Anticipated d/c date is: 04/08/2021              Patient currently not stable for discharge  Barrier to discharge-pending bed availability at skilled nursing facility  COVID-19 Labs  No results for input(s): DDIMER, FERRITIN, LDH, CRP in the last 72 hours.   Lab Results  Component Value Date   SARSCOV2NAA NEGATIVE 04/02/2021     Pressure Injury 04/03/21 Sacrum Medial Stage 1 -  Intact skin with non-blanchable redness of a localized area usually over a bony prominence. (Active)  04/03/21 0240  Location: Sacrum  Location Orientation: Medial  Staging: Stage 1 -  Intact skin with non-blanchable redness of a localized area usually over a bony prominence.  Wound Description (Comments):   Present on Admission: Yes        Recent Results (from the past 240 hour(s))  Resp Panel by RT-PCR (Flu A&B, Covid) Nasopharyngeal Swab     Status: None   Collection Time: 04/02/21  1:56 AM   Specimen: Nasopharyngeal Swab; Nasopharyngeal(NP) swabs in vial transport medium  Result Value Ref Range Status   SARS Coronavirus 2 by RT PCR NEGATIVE NEGATIVE Final    Comment: (NOTE) SARS-CoV-2 target nucleic acids are NOT DETECTED.  The SARS-CoV-2 RNA is generally detectable in  upper respiratory specimens during the acute phase of infection. The lowest concentration of SARS-CoV-2 viral copies this assay can detect is 138 copies/mL. A negative result does not preclude SARS-Cov-2 infection and should not be used as the sole basis for treatment or other patient management decisions. A negative result may occur with  improper specimen collection/handling, submission of specimen other than nasopharyngeal swab, presence of viral mutation(s) within the areas targeted by this assay, and inadequate number of viral copies(<138 copies/mL). A negative result must be combined with clinical observations, patient history, and epidemiological information. The expected result is Negative.  Fact Sheet for Patients:  EntrepreneurPulse.com.au  Fact Sheet for Healthcare Providers:  IncredibleEmployment.be  This test is no t yet approved or cleared by the [place] FDA and  has been authorized for detection and/or diagnosis of SARS-CoV-2 by FDA under an Emergency Use Authorization (EUA). This EUA will remain  in effect (meaning this test can be used) for the duration of the COVID-19 declaration under Section 564(b)(1) of the Act, 21 U.S.C.section 360bbb-3(b)(1), unless the authorization is terminated  or revoked sooner.       Influenza A by PCR NEGATIVE NEGATIVE Final   Influenza B by PCR NEGATIVE NEGATIVE Final    Comment: (NOTE) The Xpert Xpress SARS-CoV-2/FLU/RSV plus assay is intended as an aid in the diagnosis of influenza from Nasopharyngeal swab specimens and should not be used as a sole basis for treatment. Nasal washings and aspirates are unacceptable for Xpert Xpress SARS-CoV-2/FLU/RSV testing.  Fact Sheet for Patients: EntrepreneurPulse.com.au  Fact Sheet for Healthcare Providers: IncredibleEmployment.be  This test is not yet approved or cleared by the [place] FDA and has been  authorized for detection and/or diagnosis of SARS-CoV-2 by FDA under an Emergency Use Authorization (EUA). This EUA will remain in effect (meaning this test can be used) for the duration of the COVID-19 declaration under Section 564(b)(1) of the Act, 21 U.S.C. section 360bbb-3(b)(1), unless the authorization is terminated or revoked.  Performed at [facility], [place] [address] [place], [place] [address]     Oswald Hillock   [facility]  If 7PM-7AM, please contact night-coverage at [email or web address], Office  [phone]   04/06/2021, 4:56 PM  LOS: 4 days

## 2021-04-07 DIAGNOSIS — G9520 Unspecified cord compression: Secondary | ICD-10-CM | POA: Diagnosis not present

## 2021-04-07 MED ORDER — DEXAMETHASONE 4 MG PO TABS
4.0000 mg | ORAL_TABLET | Freq: Four times a day (QID) | ORAL | Status: DC
Start: 2021-04-07 — End: 2021-04-08
  Administered 2021-04-07 – 2021-04-08 (×3): 4 mg via ORAL
  Filled 2021-04-07 (×4): qty 1

## 2021-04-07 NOTE — Care Management Important Message (Signed)
Important Message  Patient Details  Name: Bradley Oliver. [place] MRN: [id number] Date of Birth: November 13, 1955   Medicare Important Message Given:  Yes     [name] 04/07/2021, 3:05 PM

## 2021-04-07 NOTE — TOC Progression Note (Signed)
Transition of Care (TOC) - Progression Note    Patient Details  Name: Bradley Oliver. [place] MRN: [id number] Date of Birth: 1956-04-27  Transition of Care [facility]) CM/SW Contact  Bradley Chars, LCSW Phone Number: 04/07/2021, 3:34 PM  Clinical Narrative:   Josem Kaufmann submitted in [place] with chosen facility pending.  Will need to call [facility] with SNF facility choice once made.    Expected Discharge Plan: [place] Barriers to Discharge: Continued Medical Work up  Expected Discharge Plan and Services Expected Discharge Plan: [place] In-house Referral: Clinical Social Work Discharge Planning Services: CM Consult   Living arrangements for the past 2 months: Single Family Home                                       Social Determinants of Health (SDOH) Interventions    Readmission Risk Interventions No flowsheet data found.

## 2021-04-07 NOTE — Plan of Care (Signed)

## 2021-04-07 NOTE — Progress Notes (Signed)
Subjective: Patient reports that he is having less "ghost feet" in his bilateral feet. He was able to stand up multiple times yesterday ad feel that his strength is continuing to improve. No acute events overnight.   Objective: Vital signs in last 24 hours: Temp:  [97.6 F (36.4 C)-98.8 F (37.1 C)] 97.9 F (36.6 C) (11/16 0700) Pulse Rate:  [66-85] 75 (11/16 0700) Resp:  [15-18] 17 (11/16 0700) BP: (123-147)/(84-94) 142/93 (11/16 0700) SpO2:  [96 %-98 %] 98 % (11/16 0700)  Intake/Output from previous day: 11/15 0701 - 11/16 0700 In: 2194.9 [P.O.:480; I.V.:1714.9] Out: 2085 [Urine:2050; Drains:35] Intake/Output this shift: No intake/output data recorded.  Physical Exam: Patient is awake, A/O X 4, conversant, and in good spirits. He is in NAD and his VSS. Speech is fluent and appropriate. MAEW. Sensation to light touch is continuing to improve, BUE > BLE. PERLA, EOMI. CNs grossly intact. Dressing is intact with old drainage. Incision is well approximated with no drainage, erythema, or edema. Drain has been removed.   Lab Results: No results for input(s): WBC, HGB, HCT, PLT in the last 72 hours. BMET No results for input(s): NA, K, CL, CO2, GLUCOSE, BUN, CREATININE, CALCIUM in the last 72 hours.  Studies/Results: No results found.  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 Patient is s/p T4-T6 with T5 transverse decompression, T3-T7 pedicle screw instrumentation on 04/03/2021. He is continuing to recover well and reports increased strength and decreased numbness. He has appropriate incisional pain that is exacerbated with movement. The patient is stable and improving as expected from a neurosurgical perspective. Continue pain management. Continue to encourage mobilization and working with PT/OT. PT/OT recommending CIR placement for later convalescence.      LOS: 5 days     Marvis Moeller, DNP, NP-C 04/07/2021, 8:10  AM

## 2021-04-07 NOTE — Progress Notes (Signed)
Occupational Therapy Treatment Patient Details Name: Bradley Oliver. [place] MRN: [id number] DOB: 1956-05-10 Today's Date: 04/07/2021   History of present illness 65 y/o male presented to [facility] ED on 04/01/21 following fall due to weakness/numbness of bilateral LE weakness. MRI T-spine showed large destructive and infiltrative metastatic deposit centered at the L paraspinous region involvement of T4-T7 vertebral bodies, associated epidural extension with severe spinal stenosis, cord compression and cord signal changes at levels of T4-7, metatstatic lesions of L lung, associated pathologic fx of T5, and T11 vertebral body enhancing lesion. MRI L-spine showed 8 mm enhancing lesion within inferior aspect of L5 vertebral body suspicious for metastatic lesion. S/p T3-7 fusion, T4-6 laminectomy, T5 transpedicular decompresion on 11/11. 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   OT comments  Bradley Oliver seen for OT treatment on this date. Upon arrival to room pt awake/alert and reporting anger 2/2 rehab d/c options. Inpatient rehab has signed off. Pt has a preference for SNF- [facility], if not given his choice, pt reporting he will not go. Pt required MOD A + 2 + RW for toilet t/f, ambulating ~ 10 ft. Pt attempted perciare but was unable to complete, ultimately requiring MAX A for pericare, standing at toilet w/ BSC as riser.  MOD A +2 + RW for simulated household distances, ambulating toilet to nurses desk ~ 20 ft, resting in chair, then continuing nurses desk to window~ 50 ft. Pt demonstrated several LOBs, but able to correct with assistance. Pt responded well to challenges concerning his ability to complete a task.     Recommendations for follow up therapy are one component of a multi-disciplinary discharge planning process, led by the attending physician.  Recommendations may be updated based on patient status, additional functional  criteria and insurance authorization.    Follow Up Recommendations  Skilled nursing-short term rehab (<3 hours/day)    Assistance Recommended at Discharge Intermittent Supervision/Assistance  Equipment Recommendations  BSC/3in1;Wheelchair (measurements OT);Wheelchair cushion (measurements OT);Hospital bed    Recommendations for Other Services      Precautions / Restrictions Precautions Precautions: Back Restrictions Weight Bearing Restrictions: No       Mobility Bed Mobility Overal bed mobility: Needs Assistance Bed Mobility: Supine to Sit Rolling: Min guard   Supine to sit: Min guard;HOB elevated     General bed mobility comments: Pt received in bed and left in chair    Transfers Overall transfer level: Needs assistance Equipment used: Rolling walker (2 wheels) Transfers: Sit to/from Stand                   Balance Overall balance assessment: Needs assistance Sitting-balance support: Feet supported;No upper extremity supported Sitting balance-Leahy Scale: Fair     Standing balance support: Bilateral upper extremity supported;During functional activity;Reliant on assistive device for balance Standing balance-Leahy Scale: Poor                             ADL either performed or assessed with clinical judgement   ADL Overall ADL's : Needs assistance/impaired                                       General ADL Comments: Pt required MOD A + 2 + RW for toilet t/f, ambulating ~ 10 ft. Pt attempted perciare but was unable to complete, ultimately  requiring MAX A for pericare, standing at toilet w/ BSC as riser.   MOD A +2 + RW for simulated household distances, ambulating toilet to nurses desk ~ 20 ft, resting in chair, then continuing nurses desk to window~ 50 ft. Pt demonstrated several LOBs, but able to correct with assistance.    Extremity/Trunk Assessment Upper Extremity Assessment Upper Extremity Assessment: Generalized weakness    Lower Extremity Assessment Lower Extremity Assessment: RLE deficits/detail;LLE deficits/detail (pt reports inability to percieve feet placement)        Vision       Perception     Praxis      Cognition Arousal/Alertness: Awake/alert Behavior During Therapy: Agitated Overall Cognitive Status: Impaired/Different from baseline Area of Impairment: Safety/judgement                         Safety/Judgement: Decreased awareness of safety;Decreased awareness of deficits Awareness: Emergent   General Comments: pt remains distressed concerning d/c plans. perseverating on topic of unacceptability of facilities for rehab.          Exercises Exercises: Other exercises Other Exercises Other Exercises: Pt educ re: d/c recs, ECS, importance of finding appropriate amount of challenge to not break pcns Other Exercises: sup<>sit, sit<>stand, toilet t/f, ambulate to nurse's desk, rest / RN removed IV 2/2 pt disturbing line, ambulate nurse's desk to window, return to room   Shoulder Instructions       General Comments      Pertinent Vitals/ Pain       Pain Assessment: Faces Faces Pain Scale: Hurts a little bit Pain Descriptors / Indicators: Grimacing  Home Living                                          Prior Functioning/Environment              Frequency  Min 2X/week        Progress Toward Goals  OT Goals(current goals can now be found in the care plan section)  Progress towards OT goals: Progressing toward goals  Acute Rehab OT Goals Patient Stated Goal: to prove that he can get better OT Goal Formulation: With patient Potential to Achieve Goals: Fair ADL Goals Pt Will Perform Grooming: with set-up;bed level Pt Will Transfer to Toilet: with +2 assist;with max assist;stand pivot transfer;bedside commode Additional ADL Goal #1: pt will complete sit<>stand min (A) with RW as precursor to adls Additional ADL Goal #2: pt will complete  basic transfer total +2 mod (A) as precursor to adls Additional ADL Goal #3: pt will static sit min guard (A) as precursor to basic transfer  Plan Discharge plan needs to be updated    Co-evaluation    PT/OT/SLP Co-Evaluation/Treatment: Yes Reason for Co-Treatment: Complexity of the patient's impairments (multi-system involvement);Necessary to address cognition/behavior during functional activity;For patient/therapist safety;To address functional/ADL transfers PT goals addressed during session: Mobility/safety with mobility;Balance OT goals addressed during session: ADL's and self-care;Proper use of Adaptive equipment and DME;Strengthening/ROM      AM-PAC OT "6 Clicks" Daily Activity     Outcome Measure   Help from another person eating meals?: None Help from another person taking care of personal grooming?: None Help from another person toileting, which includes using toliet, bedpan, or urinal?: A Lot Help from another person bathing (including washing, rinsing, drying)?: A Lot Help from another person to put  on and taking off regular upper body clothing?: A Little Help from another person to put on and taking off regular lower body clothing?: A Lot 6 Click Score: 17    End of Session Equipment Utilized During Treatment: Gait belt;Rolling walker (2 wheels)  OT Visit Diagnosis: Unsteadiness on feet (R26.81);Muscle weakness (generalized) (M62.81);Pain   Activity Tolerance Patient tolerated treatment well   Patient Left in chair;with call bell/phone within reach;with family/visitor present   Nurse Communication Other (comment) (Nurse removed IV during tx)        Time:  -     Charges:    Nino Glow, Markus Daft 04/07/2021, 3:56 PM

## 2021-04-07 NOTE — Progress Notes (Signed)
Physical Therapy Treatment Patient Details Name: Bradley Oliver. Lakins MRN: [id number] DOB: 17-May-1956 Today's Date: 04/07/2021   History of Present Illness 65 y/o male presented to [facility] ED on 04/01/21 following fall due to weakness/numbness of bilateral LE weakness. MRI T-spine showed large destructive and infiltrative metastatic deposit centered at the L paraspinous region involvement of T4-T7 vertebral bodies, associated epidural extension with severe spinal stenosis, cord compression and cord signal changes at levels of T4-7, metatstatic lesions of L lung, associated pathologic fx of T5, and T11 vertebral body enhancing lesion. MRI L-spine showed 8 mm enhancing lesion within inferior aspect of L5 vertebral body suspicious for metastatic lesion. S/p T3-7 fusion, T4-6 laminectomy, T5 transpedicular decompresion on 11/11. 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    PT Comments    Pt was able to progress to hallway gait today, but required two sets (and more) skilled hands to ambulate safely with hallway distances and RW (two person physical assist and third to follow with chair).  He is really doing well pushing himself and has to often be educated not to do too much straining with his fresh spine surgery.  PT will continue to follow acutely for safe mobility progression.  Recommendations for follow up therapy are one component of a multi-disciplinary discharge planning process, led by the attending physician.  Recommendations may be updated based on patient status, additional functional criteria and insurance authorization.  Follow Up Recommendations  Skilled nursing-short term rehab (<3 hours/day) (denied CIR)     Assistance Recommended at Discharge Intermittent Supervision/Assistance  Equipment Recommendations  Rolling walker (2 wheels);BSC/3in1;Hospital bed;Wheelchair (measurements PT);Wheelchair cushion  (measurements PT)    Recommendations for Other Services       Precautions / Restrictions Precautions Precautions: Back Precaution Booklet Issued: No Precaution Comments: hemovac d/c, had to reinforce that we did not want to do too much straining and resistive exercises even with legs to protect his back right now. Restrictions Weight Bearing Restrictions: No     Mobility  Bed Mobility Overal bed mobility: Needs Assistance Bed Mobility: Rolling;Sidelying to Sit Rolling: Supervision;Min guard Sidelying to sit: Supervision;Min guard Supine to sit: Min guard;HOB elevated     General bed mobility comments: Supervision to min guard for safety as pt is fast to move and gets himself very close to the edge of the bed.    Transfers Overall transfer level: Needs assistance Equipment used: Rolling walker (2 wheels) Transfers: Sit to/from Stand Sit to Stand: Min assist;+2 safety/equipment           General transfer comment: Min assist to power up from recliner, BSC in bathroom, and bed.    Ambulation/Gait Ambulation/Gait assistance: Mod assist;+2 physical assistance Gait Distance (Feet): 20 Feet (20'x1, seated rest, 65'x 1) Assistive device: Rolling walker (2 wheels) Gait Pattern/deviations: Step-through pattern;Knee hyperextension - right;Knee hyperextension - left;Steppage;Ataxic       General Gait Details: Pt with ataxic gait pattern, at times steppage and very frequent knee hyperextension bil in stance.  He makes up for his leg weakness with significant upper extremity reliance on RW handles.   Stairs             Wheelchair Mobility    Modified Rankin (Stroke Patients Only)       Balance Overall balance assessment: Needs assistance Sitting-balance support: Feet supported;No upper extremity supported Sitting balance-Leahy Scale: Fair     Standing balance support: Bilateral upper extremity supported Standing balance-Leahy Scale: Poor Standing  balance  comment: Pt was unable to perform his own pericare because he                            Cognition Arousal/Alertness: Awake/alert Behavior During Therapy: Impulsive Overall Cognitive Status: Impaired/Different from baseline Area of Impairment: Safety/judgement                         Safety/Judgement: Decreased awareness of safety;Decreased awareness of deficits Awareness: Emergent   General Comments: Pt remains impulsive, less worked up today, but needs redirection to task (I think this is baseline personality, not true cognitive deficit).        Exercises Other Exercises Other Exercises: Pt educ re: d/c recs, ECS, importance of finding appropriate amount of challenge to not break pcns Other Exercises: sup<>sit, sit<>stand, toilet t/f, ambulate to nurse's desk, rest / RN removed IV 2/2 pt disturbing line, ambulate nurse's desk to window, return to room    General Comments        Pertinent Vitals/Pain Pain Assessment: Faces Faces Pain Scale: Hurts even more Pain Location: scapula R / back pain Pain Descriptors / Indicators: Grimacing Pain Intervention(s): Monitored during session;Limited activity within patient's tolerance;Repositioned    Home Living                          Prior Function            PT Goals (current goals can now be found in the care plan section) Progress towards PT goals: Progressing toward goals    Frequency    Min 3X/week      PT Plan Frequency needs to be updated;Discharge plan needs to be updated    Co-evaluation PT/OT/SLP Co-Evaluation/Treatment: Yes Reason for Co-Treatment: Complexity of the patient's impairments (multi-system involvement);Necessary to address cognition/behavior during functional activity;For patient/therapist safety;To address functional/ADL transfers PT goals addressed during session: Mobility/safety with mobility;Balance;Proper use of DME;Strengthening/ROM OT goals addressed during  session: ADL's and self-care;Proper use of Adaptive equipment and DME;Strengthening/ROM      AM-PAC PT "6 Clicks" Mobility   Outcome Measure  Help needed turning from your back to your side while in a flat bed without using bedrails?: A Little Help needed moving from lying on your back to sitting on the side of a flat bed without using bedrails?: A Lot Help needed moving to and from a bed to a chair (including a wheelchair)?: A Lot Help needed standing up from a chair using your arms (e.g., wheelchair or bedside chair)?: A Lot Help needed to walk in hospital room?: A Lot Help needed climbing 3-5 steps with a railing? : Total 6 Click Score: 12    End of Session Equipment Utilized During Treatment: Gait belt Activity Tolerance: Patient limited by pain Patient left: in chair;with call bell/phone within reach;with family/visitor present   PT Visit Diagnosis: Unsteadiness on feet (R26.81);Muscle weakness (generalized) (M62.81);History of falling (Z91.81);Difficulty in walking, not elsewhere classified (R26.2)     Time: 9357-0177 PT Time Calculation (min) (ACUTE ONLY): 45 min  Charges:  $Gait Training: 8-22 mins                    Verdene Lennert, PT, DPT  Acute Rehabilitation Ortho Tech Supervisor [phone] pager [phone]) [phone] office

## 2021-04-07 NOTE — [place] FL2 (Signed)
[place] MEDICAID FL2 LEVEL OF CARE SCREENING TOOL     IDENTIFICATION  Patient Name: Bradley Oliver. Steffler Birthdate: [birth year] Sex: male Admission Date (Current Location): 04/01/2021  [place] and [place] Number:  Herbalist and Address:  [facility], [place] [address] [place], [place] [address]      Provider Number: [id number]  Attending Physician Name and Address:  Terrilee Croak, MD  Relative Name and Phone Number:       Current Level of Care: Hospital Recommended Level of Care: [place] Prior Approval Number:    Date Approved/Denied:   PASRR Number: [id number]  Discharge Plan: SNF    Current Diagnoses: Patient Active Problem List   Diagnosis Date Noted   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   Pressure injury of skin 04/03/2021   Cord compression ([place]) 04/02/2021   Pleural effusion 04/02/2021   Elevated d-dimer 04/02/2021   Hypokalemia 04/02/2021   Protein-calorie malnutrition, severe 11/13/2017   Neutropenic fever ([place]) 11/10/2017   Squamous cell carcinoma of right tonsil ([place]) 11/10/2017    Orientation RESPIRATION BLADDER Height & Weight     Self, Time, Situation, Place  Normal External catheter (condom cath.) Weight: 50 kg Height:  5\' 10"  (177.8 cm)  BEHAVIORAL SYMPTOMS/MOOD NEUROLOGICAL BOWEL NUTRITION STATUS      Continent Diet (refer to d/c summary)  AMBULATORY STATUS COMMUNICATION OF NEEDS Skin   Extensive Assist Verbally Normal                       Personal Care Assistance Level of Assistance  Bathing, Feeding, Dressing Bathing Assistance: Maximum assistance Feeding assistance: Independent Dressing Assistance: Maximum assistance     Functional Limitations Info  Sight, Hearing, Speech Sight Info: Adequate Hearing Info: Adequate Speech Info: Adequate    SPECIAL CARE FACTORS FREQUENCY  PT (By licensed PT), OT (By licensed OT)     PT Frequency: 5x/ week, evaluate and treat OT Frequency: 5x/  week, evaluate and treat            Contractures Contractures Info: Not present    Additional Factors Info  Code Status, Allergies Code Status Info: Full code Allergies Info: No known allergies           Current Medications (04/07/2021):  This is the current hospital active medication list Current Facility-Administered Medications  Medication Dose Route Frequency Provider Last Rate Last Admin   0.9 %  sodium chloride infusion (Manually program via Guardrails IV Fluids)   Intravenous Once [place], Marge Duncans, MD       0.9 %  sodium chloride infusion  250 mL Intravenous Continuous Darrick Meigs, Marge Duncans, MD       acetaminophen (TYLENOL) tablet 650 mg  650 mg Oral Q6H PRN Oswald Hillock, MD       Or   acetaminophen (TYLENOL) suppository 650 mg  650 mg Rectal Q6H PRN Darrick Meigs, Marge Duncans, MD       alum & mag hydroxide-simeth (MAALOX/MYLANTA) 200-200-20 MG/5ML suspension 30 mL  30 mL Oral Q6H PRN Darrick Meigs, Marge Duncans, MD       Chlorhexidine Gluconate Cloth 2 % PADS 6 each  6 each Topical Daily Oswald Hillock, MD   6 each at 04/07/21 1042   dexamethasone (DECADRON) injection 4 mg  4 mg Intravenous Q6H Oswald Hillock, MD   4 mg at 04/07/21 1121   dextrose 5 %-0.45 % sodium chloride infusion   Intravenous Continuous Oswald Hillock, MD [phone]  mL/hr at 04/07/21 0021 New Bag at 04/07/21 0021   docusate (COLACE) 50 MG/5ML liquid 100 mg  100 mg Oral BID Oswald Hillock, MD       heparin injection 5,000 Units  5,000 Units Subcutaneous Q12H Oswald Hillock, MD   5,000 Units at 04/07/21 1043   HYDROcodone-acetaminophen (NORCO) 10-325 MG per tablet 1 tablet  1 tablet Oral Q4H PRN Oswald Hillock, MD       HYDROmorphone (DILAUDID) injection 0.5 mg  0.5 mg Intravenous Q3H PRN Oswald Hillock, MD       lactose free nutrition (BOOST PLUS) liquid 237 mL  237 mL Oral TID WC Oswald Hillock, MD   237 mL at 04/07/21 1045   levothyroxine (SYNTHROID) tablet 50 mcg  50 mcg Oral Q0600 Oswald Hillock, MD   50 mcg at 04/07/21 0553   MEDLINE mouth rinse  15  mL Mouth Rinse BID Oswald Hillock, MD   15 mL at 04/07/21 1045   menthol-cetylpyridinium (CEPACOL) lozenge 3 mg  1 lozenge Oral PRN Oswald Hillock, MD       Or   phenol (CHLORASEPTIC) mouth spray 1 spray  1 spray Mouth/Throat PRN Darrick Meigs, Marge Duncans, MD       methocarbamol (ROBAXIN) tablet 500 mg  500 mg Oral Q6H PRN Oswald Hillock, MD       Or   methocarbamol (ROBAXIN) 500 mg in dextrose 5 % 50 mL IVPB  500 mg Intravenous Q6H PRN Oswald Hillock, MD       multivitamin with minerals tablet 1 tablet  1 tablet Oral Daily Oswald Hillock, MD   1 tablet at 04/07/21 1044   ondansetron (ZOFRAN) tablet 4 mg  4 mg Oral Q6H PRN Oswald Hillock, MD       Or   ondansetron (ZOFRAN) injection 4 mg  4 mg Intravenous Q6H PRN Oswald Hillock, MD       oxyCODONE (Oxy IR/ROXICODONE) immediate release tablet 10 mg  10 mg Oral Q3H PRN Oswald Hillock, MD       polyethylene glycol (MIRALAX / GLYCOLAX) packet 17 g  17 g Oral Daily PRN Oswald Hillock, MD       sodium chloride flush (NS) 0.9 % injection 3 mL  3 mL Intravenous Q12H Darrick Meigs, Gagan S, MD   3 mL at 04/06/21 1038   sodium chloride flush (NS) 0.9 % injection 3 mL  3 mL Intravenous PRN Oswald Hillock, MD         Discharge Medications: Please see discharge summary for a list of discharge medications.  Relevant Imaging Results:  Relevant Lab Results:   Additional Information SS# [id number]  Sharin Mons, RN

## 2021-04-07 NOTE — Progress Notes (Signed)
PROGRESS NOTE  Bradley Oliver  DOB: 1955-10-29  PCP: Patient, [name] KYH:[id number]  DOA: 04/01/2021  LOS: 5 days  Hospital Day: 7  Chief Complaint  Patient presents with   Fall    Brief narrative: Bradley Oliver is a 65 y.o. 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 Patient presented to ED on 11/10 after a fall at home in the setting of weakness/numbness of bilateral lower extremities.  D-dimer was elevated, CTA chest was negative for PE.  It showed medial left lung mass extending to mediastinum.   MRI of lumbar spine showed 8 mm enhancing lesion within the inferior aspect of L5 vertebral body suspicious for metastatic lesion.   MRI of thoracic spine showed large destructive and infiltrative metastatic deposit centered at the left paraspinous region with involvement of T4-T7 vertebral bodies.  Associated epidural extension with severe spinal stenosis, cord compression and cord signal changes at the level of T4-5 through T6-7. Metastatic lesion extends to involve the adjacent posterior medial aspect of the left lung as well as the left adjacent mediastinum and left hilar/perihilar region.  Associated pathologic fracture of T5.  1 cm enhancing lesion within the T11 vertebral body suspicious for metastatic lesion.  Neurosurgery consulted by ED physician.  Patient was [facility], underwent decompression T4-T6 with T5 transverse decompression, T3-T7 pedicle screw instrumentation.  Subjective: Patient was seen and examined this morning.  Pleasant middle-aged Caucasian male.  Looks older for his age. Not in distress.  He states that he is getting more strength in his lower extremities now.  Assessment/Plan: Acute Cord compression due to underlying metastatic disease -MRI of the T-spine showed significant cord compression at  T4-5 through T6-7 -S/p decompression per neurosurgery on 11/12 -Currently on Decadron 4 mg IV every 6 hours -Steroid taper as per neurosurgery -Neurosurgery following   Chronic dysphagia likely secondary to cancer -Speech therapy evaluation obtained.  Currently on regular diet.   -Patient thinks he is not taking enough oral hydration and hence want to continue IV fluid.   Stage IV squamous cell carcinoma of right tonsil, HPV positive -With metastasis to lung and spine -PET/CT from 10/2020 also mention possible small bowel involvement -Previously underwent chemotherapy, radiation and immunotherapy at [facility] -Patient has been to multiple centers for treatment of HPV directed therapy -Oncology following   Left pleural effusion -Seen on CTA chest, appears partially loculated -Thoracentesis was attempted by IR but could not be performed as no fluid collection for safe approach was found  -Respiratory status remained stable at this time.  Not requiring supplemental oxygen.  Elevated D-dimer -Likely from underlying cancer -CTA chest negative for PE -Venous duplex of lower extremities negative for DVT   Hypothyroidism -Continue Synthroid   Mobility: Encourage ambulation Living condition: Was living at home Goals of care:   Code Status: DNR  Nutritional status: Body mass index is 15.82 kg/m. Nutrition Problem: Inadequate oral intake Etiology: dysphagia Signs/Symptoms: per patient/family report Diet:  Diet Order             Diet regular Room service appropriate? Yes; Fluid consistency: Thin  Diet effective now                  DVT prophylaxis:  heparin injection 5,000 Units Start: 04/04/21 1000   Antimicrobials: None Fluid: D5 half NS at 75 mill per hour Consultants: Neurosurgery Family Communication: None at  bedside  Status is: Inpatient  Remains inpatient appropriate because: Pending SNF  Dispo: The patient is from: Home              Anticipated d/c is  to: SNF              Patient currently is not medically stable to d/c.   Difficult to place patient No     Infusions:   sodium chloride     dextrose 5 % and 0.45% NaCl 75 mL/hr at 04/07/21 0021   methocarbamol (ROBAXIN) IV      Scheduled Meds:  sodium chloride   Intravenous Once   Chlorhexidine Gluconate Cloth  6 each Topical Daily   dexamethasone (DECADRON) injection  4 mg Intravenous Q6H   docusate  100 mg Oral BID   heparin injection (subcutaneous)  5,000 Units Subcutaneous Q12H   lactose free nutrition  237 mL Oral TID WC   levothyroxine  50 mcg Oral Q0600   mouth rinse  15 mL Mouth Rinse BID   multivitamin with minerals  1 tablet Oral Daily   sodium chloride flush  3 mL Intravenous Q12H    PRN meds: acetaminophen **OR** acetaminophen, alum & mag hydroxide-simeth, HYDROcodone-acetaminophen, HYDROmorphone (DILAUDID) injection, menthol-cetylpyridinium **OR** phenol, methocarbamol **OR** methocarbamol (ROBAXIN) IV, ondansetron **OR** ondansetron (ZOFRAN) IV, oxyCODONE, polyethylene glycol, sodium chloride flush   Antimicrobials: Anti-infectives (From admission, onward)    Start     Dose/Rate Route Frequency Ordered Stop   04/03/21 0700  ceFAZolin (ANCEF) IVPB 2g/100 mL premix        2 g 200 mL/hr over 30 Minutes Intravenous Every 8 hours 04/03/21 0225 04/03/21 1730   04/03/21 0225  ceFAZolin (ANCEF) IVPB 2g/100 mL premix  Status:  Discontinued        2 g 200 mL/hr over 30 Minutes Intravenous 30 min pre-op 04/03/21 0225 04/04/21 0808       Objective: Vitals:   04/07/21 0700 04/07/21 1500  BP: (!) 142/93 101/75  Pulse: 75 93  Resp: 17 18  Temp: 97.9 F (36.6 C) 98.2 F (36.8 C)  SpO2: 98% 100%    Intake/Output Summary (Last 24 hours) at 04/07/2021 1710 Last data filed at 04/07/2021 1640 Gross per 24 hour  Intake 2425.95 ml  Output 2600 ml  Net -174.05 ml   Filed Weights   04/01/21 1652 04/03/21 0200  Weight: 49.9 kg 50 kg   Weight change:  Body mass  index is 15.82 kg/m.   Physical Exam: General exam: Pleasant, not in acute distress Skin: No rashes, lesions or ulcers. HEENT: Atraumatic, normocephalic, no obvious bleeding Lungs: Clear to auscultation bilaterally CVS: Regular rate and rhythm, no murmur GI/Abd soft, nontender, nondistended, bowel sound present CNS: Alert, awake, oriented x3 Psychiatry: Mood appropriate Extremities: No pedal edema, no calf tenderness  Data Review: I have personally reviewed the laboratory data and studies available.  F/u labs ordered Unresulted Labs (From admission, onward)     Start     Ordered   04/02/21 2256  Prepare RBC (crossmatch)  (Adult Blood Administration - Red Blood Cells)  ONCE - STAT,   STAT       Question Answer Comment  # of Units 4 units   Transfusion Indications Emergent / Trauma   Transfusion Indications Surgery   Number of Units to Keep Ahead NO units ahead   Date/Time blood product needed now   If emergent release call blood bank Not emergent release      04/02/21 2256  Signed, Terrilee Croak, MD [facility] 04/07/2021

## 2021-04-07 NOTE — TOC Initial Note (Addendum)
Transition of Care (TOC) - Initial/Assessment Note    Patient Details  Name: Bradley Oliver MRN: [id number] Date of Birth: 05/20/1956  Transition of Care [facility]) CM/SW Contact:    Sharin Mons, RN Phone Number: 04/07/2021, 1:35 PM  Clinical Narrative:          Admitted with  weakness/numbness of bilateral LE weakness/ cord compression due to underlying metastatic disease . From home with family. States independent with ADL's PTA, used rolling walker with ambulation.    RNCM received consult for possible SNF placement at time of discharge. RNCM spoke with patient regarding PT recommendation of SNF placement at time of discharge. Patient reported that patient's   he is currently unable to care for self at home given current physical needs and fall risk. Patient expressed understanding of PT recommendation and is agreeable to SNF placement at time of discharge. Patient reports preference for [facility]. RNCM discussed insurance authorization process and provided Medicare SNF ratings list. Patient expressed being hopeful for rehab and to feel better soon. No further questions reported at this time. RNCM to continue to follow and assist with discharge planning needs.  Pt states fully COVID vaccinated, booster x2.  Expected Discharge Plan: Skilled Nursing Facility Barriers to Discharge: Continued Medical Work up   Patient Goals and CMS Choice   CMS Medicare.gov Compare Post Acute Care list provided to:: Patient    Expected Discharge Plan and Services Expected Discharge Plan: [place] In-house Referral: Clinical Social Work Discharge Planning Services: CM Consult   Living arrangements for the past 2 months: [place]                                      Prior Living Arrangements/Services Living arrangements for the past 2 months: Single Family Home Lives with:: Parents, Siblings (27 y/o mother and sister) Patient language and need for  interpreter reviewed:: Yes Do you feel safe going back to the place where you live?: Yes      Need for Family Participation in Patient Care: Yes (Comment) Care giver support system in place?: No (comment)   Criminal Activity/Legal Involvement Pertinent to Current Situation/Hospitalization: No - Comment as needed  Activities of Daily Living Home Assistive Devices/Equipment: None ADL Screening (condition at time of admission) Patient's cognitive ability adequate to safely complete daily activities?: Yes Is the patient deaf or have difficulty hearing?: No Does the patient have difficulty seeing, even when wearing glasses/contacts?: No Does the patient have difficulty concentrating, remembering, or making decisions?: No Patient able to express need for assistance with ADLs?: Yes Does the patient have difficulty dressing or bathing?: Yes (secondary to worsening weakness) Independently performs ADLs?: No (secondary to worsening weakness) Communication: Independent Dressing (OT): Needs assistance Is this a change from baseline?: Change from baseline, expected to last >3 days Grooming: Needs assistance Is this a change from baseline?: Change from baseline, expected to last >3 days Feeding: Needs assistance Is this a change from baseline?: Change from baseline, expected to last >3 days Bathing: Needs assistance Is this a change from baseline?: Change from baseline, expected to last >3 days Toileting: Needs assistance Is this a change from baseline?: Change from baseline, expected to last >3days In/Out Bed: Needs assistance Is this a change from baseline?: Change from baseline, expected to last >3 days Walks in Home: Needs assistance Is this a change from baseline?: Change from baseline, expected to last >  3 days Does the patient have difficulty walking or climbing stairs?: Yes (secondary to worsening weakness) Weakness of Legs: Both Weakness of Arms/Hands: None  Permission Sought/Granted    Permission granted to share information with : Yes, Verbal Permission Granted  Share Information with NAME: Juvencio Verdi (Brother/ POA)  [phone]           Emotional Assessment Appearance:: Appears older than stated age Attitude/Demeanor/Rapport: Engaged Affect (typically observed): Accepting Orientation: : Oriented to Self, Oriented to Place, Oriented to  Time, Oriented to Situation Alcohol / Substance Use: Not Applicable Psych Involvement: No (comment)  Admission diagnosis:  Cord compression (HCC) [G95.20] Pleural effusion [J90] 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] Dyspnea, unspecified type [R06.00] Pathological fracture of vertebra due to neoplastic disease, initial encounter [Z12.45YK] 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 AT [address] [place] [address] Phone: [phone] Fax: [phone]     Social Determinants of Health (SDOH) Interventions    Readmission Risk Interventions No flowsheet data found.

## 2021-04-07 NOTE — Progress Notes (Signed)
Inpatient Rehab Admissions Coordinator:   I spoke with Pt. To let him know that [facility] is not able to offer him a bed at this time, as he has only supervision at home and we do not anticipate that he will reach supervision level during a short stay on CIR. He expressed understanding and stated he might be interested in [facility] SNF.   Clemens Catholic, [place], Central Admissions Coordinator  [phone] ([place]) [phone] (office)

## 2021-04-08 DIAGNOSIS — G9520 Unspecified cord compression: Secondary | ICD-10-CM | POA: Diagnosis not present

## 2021-04-08 MED ORDER — DEXAMETHASONE 4 MG PO TABS: 4.0000 mg | ORAL_TABLET | Freq: Two times a day (BID) | ORAL | Status: DC

## 2021-04-08 MED ORDER — DEXAMETHASONE 2 MG PO TABS: 2.0000 mg | ORAL_TABLET | Freq: Two times a day (BID) | ORAL | Status: DC

## 2021-04-08 MED ORDER — DEXAMETHASONE 4 MG PO TABS
4.0000 mg | ORAL_TABLET | Freq: Three times a day (TID) | ORAL | Status: DC
  Administered 2021-04-08 – 2021-04-09 (×4): 4 mg via ORAL
  Filled 2021-04-08 (×5): qty 1

## 2021-04-08 NOTE — Progress Notes (Signed)
Physical Therapy Treatment Patient Details Name: Bradley Oliver. [place]anca MRN: [id number] DOB: November 05, 1955 Today's Date: 04/08/2021   History of Present Illness 65 y/o male presented to [facility] ED on 04/01/21 following fall due to weakness/numbness of bilateral LE weakness. MRI T-spine showed large destructive and infiltrative metastatic deposit centered at the L paraspinous region involvement of T4-T7 vertebral bodies, associated epidural extension with severe spinal stenosis, cord compression and cord signal changes at levels of T4-7, metatstatic lesions of L lung, associated pathologic fx of T5, and T11 vertebral body enhancing lesion. MRI L-spine showed 8 mm enhancing lesion within inferior aspect of L5 vertebral body suspicious for metastatic lesion. S/p T3-7 fusion, T4-6 laminectomy, T5 transpedicular decompresion on 11/11. 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    PT Comments    Practiced control in static sitting without UE support and static standing with UE support on RW working on locking and unlocking knees, getting feet under him and upright posture.  We did not walk today as we did not have a second person and two people are needed during gait due to significant ataxia.  PT had to terminate session early as pt was having sharp pains in his R scapula between the scapula and the spine.  He could not tolerate sitting any more once this pain triggered.  Returned to supine and was more comfortable.  PT will continue to follow acutely for safe mobility progression.  Recommendations for follow up therapy are one component of a multi-disciplinary discharge planning process, led by the attending physician.  Recommendations may be updated based on patient status, additional functional criteria and insurance authorization.  Follow Up Recommendations  Skilled nursing-short term rehab (<3 hours/day)     Assistance  Recommended at Discharge Intermittent Supervision/Assistance  Equipment Recommendations  Rolling walker (2 wheels);BSC/3in1;Hospital bed;Wheelchair (measurements PT);Wheelchair cushion (measurements PT)    Recommendations for Other Services       Precautions / Restrictions Precautions Precautions: Back Precaution Booklet Issued: No Precaution Comments: hemovac d/c, had to reinforce that we did not want to do too much straining and resistive exercises even with legs to protect his back right now. No brace needed per orders     Mobility  Bed Mobility Overal bed mobility: Needs Assistance Bed Mobility: Rolling;Sidelying to Sit;Sit to Sidelying Rolling: Supervision Sidelying to sit: Min guard     Sit to sidelying: Min assist General bed mobility comments: Supervision to min assist for bed mobility and coming to EOB.    Transfers Overall transfer level: Needs assistance Equipment used: Rolling walker (2 wheels) Transfers: Sit to/from Stand Sit to Stand: Min assist           General transfer comment: Min assist from elevated bed to stabilize RW, pt locked out on extended knees in standing.    Ambulation/Gait                   Stairs             Wheelchair Mobility    Modified Rankin (Stroke Patients Only)       Balance Overall balance assessment: Needs assistance Sitting-balance support: Feet supported;Bilateral upper extremity supported;No upper extremity supported Sitting balance-Leahy Scale: Fair Sitting balance - Comments: close supervision without UE support, ataxic trunk   Standing balance support: Bilateral upper extremity supported Standing balance-Leahy Scale: Poor Standing balance comment: reliant on UE support instanding, can remove one hand, but has to lock out in extension bil  knees (leaning against bed), practiced alternating terminal knee extensions attempting to control them more.  Stood ~8 mins before fatiguing and ploping down on  the bed.  After sitting his R scapula started to burn and he was screaming and had to return to supine.  He is point tender to the touch between his spine and the medial border of his scapula.                            Cognition Arousal/Alertness: Awake/alert Behavior During Therapy: Agitated;Flat affect;Impulsive;Anxious Overall Cognitive Status: Impaired/Different from baseline Area of Impairment: Safety/judgement;Awareness                         Safety/Judgement: Decreased awareness of safety;Decreased awareness of deficits Awareness: Emergent   General Comments: Pt getting better, still moves quickly        Exercises      General Comments        Pertinent Vitals/Pain Pain Assessment: Faces Faces Pain Scale: Hurts worst Pain Location: R scapula to spine Pain Descriptors / Indicators: Grimacing;Guarding;Sharp;Shooting;Squeezing;Moaning;Crying Pain Intervention(s): Limited activity within patient's tolerance;Monitored during session;Repositioned    Home Living                          Prior Function            PT Goals (current goals can now be found in the care plan section) Progress towards PT goals: Progressing toward goals    Frequency    Min 3X/week      PT Plan Current plan remains appropriate    Co-evaluation              AM-PAC PT "6 Clicks" Mobility   Outcome Measure  Help needed turning from your back to your side while in a flat bed without using bedrails?: A Little Help needed moving from lying on your back to sitting on the side of a flat bed without using bedrails?: A Little Help needed moving to and from a bed to a chair (including a wheelchair)?: A Lot Help needed standing up from a chair using your arms (e.g., wheelchair or bedside chair)?: A Little Help needed to walk in hospital room?: Total Help needed climbing 3-5 steps with a railing? : Total 6 Click Score: 13    End of Session   Activity  Tolerance: Patient limited by pain Patient left: in bed;with call bell/phone within reach;with bed alarm set   PT Visit Diagnosis: Unsteadiness on feet (R26.81);Muscle weakness (generalized) (M62.81);History of falling (Z91.81);Difficulty in walking, not elsewhere classified (R26.2)     Time: 7564-3329 PT Time Calculation (min) (ACUTE ONLY): 46 min  Charges:  $Therapeutic Activity: 38-52 mins                    Verdene Lennert, PT, DPT  Acute Rehabilitation Ortho Tech Supervisor [phone] pager [phone]) [phone] office

## 2021-04-08 NOTE — Progress Notes (Signed)
Occupational Therapy Treatment Patient Details Name: Bradley Oliver. Bradley Oliver MRN: [id number] DOB: Mar 21, 1956 Today's Date: 04/08/2021   History of present illness 65 y/o male presented to [facility] ED on 04/01/21 following fall due to weakness/numbness of bilateral LE weakness. MRI T-spine showed large destructive and infiltrative metastatic deposit centered at the L paraspinous region involvement of T4-T7 vertebral bodies, associated epidural extension with severe spinal stenosis, cord compression and cord signal changes at levels of T4-7, metatstatic lesions of L lung, associated pathologic fx of T5, and T11 vertebral body enhancing lesion. MRI L-spine showed 8 mm enhancing lesion within inferior aspect of L5 vertebral body suspicious for metastatic lesion. S/p T3-7 fusion, T4-6 laminectomy, T5 transpedicular decompresion on 11/11. 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   OT comments  Pt with slower progress towards OT goals this session due to progressive R shoulder pain with activity, as well as unhappiness regarding current SNF bed offers. Pt overall min guard for bed mobility with fair carryover of back precautions. Educated on strategies for safe completion of LB dressing with physical assist needed to safely cross Les (decreased sensation). Planned to progress mobility abilities per pt valued goals. However, pt unable to successfully stand with RW at bedside at this time with difficulty redirecting to optimal body mechanics and safe hand placement with pt ultimately becoming upset and returning to supine. Provided encouragement for continued progression of activity within pt tolerance.    Recommendations for follow up therapy are one component of a multi-disciplinary discharge planning process, led by the attending physician.  Recommendations may be updated based on patient status, additional functional criteria and  insurance authorization.    Follow Up Recommendations  Skilled nursing-short term rehab (<3 hours/day) ([facility]IR denied)    Assistance Recommended at Discharge Frequent or constant Supervision/Assistance  Equipment Recommendations  BSC/3in1;Wheelchair (measurements OT);Wheelchair cushion (measurements OT);Hospital bed    Recommendations for Other Services Other (comment)    Precautions / Restrictions Precautions Precautions: Back Precaution Booklet Issued: No Precaution Comments: hemovac d/c, had to reinforce that we did not want to do too much straining and resistive exercises even with legs to protect his back right now. No brace needed per orders Restrictions Weight Bearing Restrictions: No       Mobility Bed Mobility Overal bed mobility: Needs Assistance Bed Mobility: Rolling;Sidelying to Sit;Sit to Sidelying Rolling: Supervision Sidelying to sit: Min guard     Sit to sidelying: Min guard General bed mobility comments: min guard for safety in guiding B LE due to decreased sensation (tendency to move quick and bump B feet, etc). able to use bedrail and lower HOB without assist    Transfers Overall transfer level: Needs assistance Equipment used: Rolling walker (2 wheels) Transfers: Sit to/from Stand             General transfer comment: attempted sit to stand from bedside. pt scooting dangerously close to EOB and wanting to pull on RW to stand. Attempted to redirect and educate on more safe hand placement options ot raising bed height - pt not receptive to education. Ultimately unable to achieve standing, pt reporting progressive shoulder pain     Balance Overall balance assessment: Needs assistance Sitting-balance support: Feet supported;No upper extremity supported Sitting balance-Leahy Scale: Fair  ADL either performed or assessed with clinical judgement   ADL Overall ADL's : Needs assistance/impaired                        Lower Body Dressing Details (indicate cue type and reason): educated on strategies for LB dressing to maintain back precautions. With Min A, pt able to form figure four position to reach B socks. Pt with poor sensation/proprioception of B feet - required hands on assist to avoid bumping B heels on lowered bedrail. Encouraged pt to sit for dressing tasks, avoiding bending.               General ADL Comments: Pt reports value in working on proprioception of B feet in standing/mobility tasks. Planned to address this though pt limited by R shoulder pain and agitated regarding SNF bed offers    Extremity/Trunk Assessment Upper Extremity Assessment Upper Extremity Assessment: Generalized weakness LUE Deficits / Details: severe pain with WB/reaching   Lower Extremity Assessment Lower Extremity Assessment: Defer to PT evaluation        Vision   Vision Assessment?: No apparent visual deficits   Perception     Praxis      Cognition Arousal/Alertness: Awake/alert Behavior During Therapy: Agitated;Flat affect;Impulsive;Anxious Overall Cognitive Status: Impaired/Different from baseline Area of Impairment: Safety/judgement;Awareness                         Safety/Judgement: Decreased awareness of safety;Decreased awareness of deficits Awareness: Emergent   General Comments: Pt remains impulsive, very worked up about his 1 star SNF rehab bed offers - difficult to redirect. max cues for safety though pt with decreased compliance in hand placement, etc to maximize safety          Exercises     Shoulder Instructions       General Comments Family initially present and brother entering at end of session, further discussions of SNF rehab    Pertinent Vitals/ Pain       Pain Assessment: Faces Faces Pain Scale: Hurts even more Pain Location: R shoulder Pain Descriptors / Indicators: Grimacing;Guarding;Moaning;Shooting Pain Intervention(s): Monitored  during session;Limited activity within patient's tolerance;Other (comment) (notified RN)  Home Living                                          Prior Functioning/Environment              Frequency  Min 2X/week        Progress Toward Goals  OT Goals(current goals can now be found in the care plan section)  Progress towards OT goals: OT to reassess next treatment  Acute Rehab OT Goals Patient Stated Goal: go to a decent rehab facility, work on B LE strength/proprioception OT Goal Formulation: With patient Potential to Achieve Goals: Fair ADL Goals Pt Will Perform Grooming: with set-up;bed level Pt Will Transfer to Toilet: with +2 assist;with max assist;stand pivot transfer;bedside commode Additional ADL Goal #1: pt will complete sit<>stand min (A) with RW as precursor to adls Additional ADL Goal #2: pt will complete basic transfer total +2 mod (A) as precursor to adls Additional ADL Goal #3: pt will static sit min guard (A) as precursor to basic transfer  Plan Discharge plan remains appropriate    Co-evaluation  AM-PAC OT "6 Clicks" Daily Activity     Outcome Measure   Help from another person eating meals?: None Help from another person taking care of personal grooming?: A Little Help from another person toileting, which includes using toliet, bedpan, or urinal?: A Lot Help from another person bathing (including washing, rinsing, drying)?: A Lot Help from another person to put on and taking off regular upper body clothing?: A Little Help from another person to put on and taking off regular lower body clothing?: A Lot 6 Click Score: 16    End of Session Equipment Utilized During Treatment: Gait belt;Rolling walker (2 wheels)  OT Visit Diagnosis: Unsteadiness on feet (R26.81);Muscle weakness (generalized) (M62.81);Pain Pain - Right/Left: Right Pain - part of body: Shoulder   Activity Tolerance Patient limited by pain    Patient Left in bed;with call bell/phone within reach;with bed alarm set   Nurse Communication Mobility status;Other (comment) (shoulder pain)        Time: 3299-2426 OT Time Calculation (min): 31 min  Charges: OT General Charges $OT Visit: 1 Visit OT Treatments $Self Care/Home Management : 8-22 mins $Therapeutic Activity: 8-22 mins  Malachy Chamber, OTR/L Acute Rehab Services Office: [phone]   Layla Maw 04/08/2021, 2:45 PM

## 2021-04-08 NOTE — Progress Notes (Signed)
Mobility Specialist Progress Note   04/08/21 1215  Mobility  Activity Transferred to/from [facility]  Level of Assistance Moderate assist, patient does 50-74%  Assistive Device Front wheel walker  Distance Ambulated (ft) 4 ft  Mobility Out of bed for toileting  Mobility Response Tolerated fair  Mobility performed by Mobility specialist  $Mobility charge 1 Mobility   Received pt in bed in w/ no symptoms but highly upset w/ there d/c destination. Denied mobility session but requested help to the [facility]. Bed mobility is mod independent but STS is modA d/t unsteadiness in legs, pt is also impulsive and will rush if you do not physically stop them. Successful BM was made. Pt was returned back to bed w/ call bell by side and still adamant on changing SNF locations. Session ended with pt constantly claiming " [facility] is a one star facility and I will not get the proper treatment there".    Holland Falling Mobility Specialist Phone Number [phone]

## 2021-04-08 NOTE — Plan of Care (Signed)

## 2021-04-08 NOTE — Progress Notes (Signed)
Nutrition Follow-up  DOCUMENTATION CODES:   Underweight, Severe malnutrition in context of chronic illness  INTERVENTION:  -Continue Boost Plus TID, each supplement provides 360 kcals and 14 grams protein -Continue MVI with minerals daily -Continue Hormel Shake TID with meals, each supplement provides 520 kcals and 22 grams protein   NUTRITION DIAGNOSIS:   Severe Malnutrition related to chronic illness (cancer) as evidenced by severe fat depletion, severe muscle depletion.  updated  GOAL:   Patient will meet greater than or equal to 90% of their needs  progressing  MONITOR:   PO intake, Supplement acceptance, Diet advancement, Labs, Weight trends, Skin, I & O's  REASON FOR ASSESSMENT:   Malnutrition Screening Tool    ASSESSMENT:   Bradley Oliver is a 65 y.o. 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  D-dimer elevated but CT angiogram chest negative for PE; showing medial left lung mass extending to the mediastinum.  Moderate partially loculated left pleural effusion, trace right pleural effusion.  Moderate pathologic compression fracture at T5.  MRI of C-spine showing no evidence of metastatic disease.  MRI of lumbar spine showing a 8 mm enhancing lesion within the inferior aspect of the L5 vertebral body suspicious for metastatic lesion.  MRI of thoracic spine showing large destructive and infiltrative metastatic deposit centered at the left paraspinous region with involvement of the T4-T7 vertebral bodies.  Associated epidural extension with severe spinal stenosis, cord compression, and cord signal changes at the levels of T4-5 through T6-7.  Metastatic lesion extends to involve the adjacent posterior medial aspect of the left lung as  well as the left adjacent mediastinum and left hilar/perihilar region.  Associated pathologic fracture of T5.  1 cm enhancing lesion within the T11 vertebral body suspicious for metastatic lesion.  Neurosurgery consulted by ED physician  11/12- advanced to full liquid diet 11/14- advanced to regular diet   Pt in good spirits and was excited about how much he was able to participate with PT/OT today. Reports appetite has been improving. Last 8 meal completions charted as 25-100% (56% avg meal intake). States that he is consuming supplements well. Continue current nutrition plan of care at this time.   UOP: 2139ml x24 hours I/O: +1219ml since admit  Medications: Scheduled Meds:  sodium chloride   Intravenous Once   Chlorhexidine Gluconate Cloth  6 each Topical Daily   [START ON 04/12/2021] dexamethasone  2 mg Oral Q12H   dexamethasone  4 mg Oral Q8H   [START ON 04/10/2021] dexamethasone  4 mg Oral Q12H   docusate  100 mg Oral BID   heparin injection (subcutaneous)  5,000 Units Subcutaneous Q12H   lactose free nutrition  237 mL Oral TID WC   levothyroxine  50 mcg Oral Q0600   mouth rinse  15 mL Mouth Rinse BID   multivitamin with minerals  1 tablet Oral Daily   sodium chloride flush  3 mL Intravenous Q12H  Continuous Infusions:  sodium chloride     methocarbamol (ROBAXIN) IV     Labs: Recent Labs  Lab 04/01/21 1912 04/02/21 2341 04/03/21 0225 04/04/21 0340  NA 137 136  --  133*  K 3.4* 4.2  --  3.8  CL 101  --   --  101  CO2 27  --   --  25  BUN 20  --   --  17  CREATININE 0.72  --  0.92 0.69  CALCIUM 9.3  --   --  8.5*  GLUCOSE 97  --   --  148*   NUTRITION - FOCUSED PHYSICAL EXAM:  Flowsheet Row Most Recent Value  Orbital Region Severe depletion  Upper Arm Region Severe depletion  Thoracic and Lumbar Region Severe depletion  Buccal Region Severe depletion  Temple Region Severe depletion  Clavicle Bone Region Severe depletion  Clavicle and Acromion Bone Region  Severe depletion  Scapular Bone Region Severe depletion  Dorsal Hand Severe depletion  Patellar Region Severe depletion  Anterior Thigh Region Severe depletion  Posterior Calf Region Severe depletion  Edema (RD Assessment) None  Hair Reviewed  Eyes Reviewed  Mouth Reviewed  Skin Reviewed  Nails Reviewed       Diet Order:   Diet Order             Diet regular Room service appropriate? Yes; Fluid consistency: Thin  Diet effective now                   EDUCATION NEEDS:   No education needs have been identified at this time  Skin:  Skin Assessment: Skin Integrity Issues: Skin Integrity Issues:: Stage I, Incisions Stage I: sacrum Incisions: closed back  Last BM:  11/14  Height:   Ht Readings from Last 1 Encounters:  04/01/21 5\' 10"  (1.778 m)    Weight:   Wt Readings from Last 1 Encounters:  04/03/21 50 kg    Ideal Body Weight:  75.5 kg  BMI:  Body mass index is 15.82 kg/m.  Estimated Nutritional Needs:   Kcal:  2000-2200  Protein:  110-125 grams  Fluid:  > 2 L     Cari Burgo A., MS, RD, LDN (she/her/hers) RD pager number and weekend/on-call pager number located in [place].

## 2021-04-08 NOTE — Progress Notes (Signed)
PROGRESS NOTE  Bradley Oliver. Grasmick  DOB: 1956/05/20  PCP: Patient, [name] DJM:[id number]  DOA: 04/01/2021  LOS: 6 days  Hospital Day: 8  Chief Complaint  Patient presents with   Fall    Brief narrative: Bradley Oliver is a 65 y.o. 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 Patient presented to ED on 11/10 after a fall at home in the setting of weakness/numbness of bilateral lower extremities.  D-dimer was elevated, CTA chest was negative for PE.  It showed medial left lung mass extending to mediastinum.   MRI of lumbar spine showed 8 mm enhancing lesion within the inferior aspect of L5 vertebral body suspicious for metastatic lesion.   MRI of thoracic spine showed large destructive and infiltrative metastatic deposit centered at the left paraspinous region with involvement of T4-T7 vertebral bodies.  Associated epidural extension with severe spinal stenosis, cord compression and cord signal changes at the level of T4-5 through T6-7. Metastatic lesion extends to involve the adjacent posterior medial aspect of the left lung as well as the left adjacent mediastinum and left hilar/perihilar region.  Associated pathologic fracture of T5.  1 cm enhancing lesion within the T11 vertebral body suspicious for metastatic lesion.  Neurosurgery consulted by ED physician.  Patient was transferred to [facility], underwent decompression T4-T6 with T5 transverse decompression, T3-T7 pedicle screw instrumentation.  Subjective: Patient was seen and examined this morning.  Pleasant.  He is excited that he was able to perform better with physical therapy than expected.  No new symptoms.  Neurosurgery NP Josh was at bedside at the time of my evaluation.  We discussed about tapering off Decadron.  Assessment/Plan: Acute Cord compression due to underlying metastatic  disease -MRI of the T-spine showed significant cord compression at T4-5 through T6-7 -S/p decompression per neurosurgery on 11/12 -Currently on Decadron 4 mg IV every 6 hours.  Noted the plan from neurosurgery to taper down. -Neurosurgery following -Appreciate PT eval.  Patient performed better yesterday.  Continue to work with PT.   Chronic dysphagia likely secondary to cancer -Speech therapy evaluation obtained.  Currently on regular diet.  Encourage oral hydration.  Off IV fluid.   Stage IV squamous cell carcinoma of right tonsil, HPV positive -With metastasis to lung and spine -PET/CT from 10/2020 also mention possible small bowel involvement -Previously underwent chemotherapy, radiation and immunotherapy at [facility] -Patient has been to multiple centers for treatment of HPV directed therapy -Oncology eval appreciated.   Left pleural effusion -Seen on CTA chest, appears partially loculated -Thoracentesis was attempted by IR but could not be performed as no fluid collection for safe approach was found  -Respiratory status remained stable at this time.  Not requiring supplemental oxygen.  Elevated D-dimer -Likely from underlying cancer -CTA chest negative for PE -Venous duplex of lower extremities negative for DVT   Hypothyroidism -Continue Synthroid   Mobility: Encourage ambulation Living condition: Was living at home Goals of care:   Code Status: DNR  Nutritional status: Body mass index is 15.82 kg/m. Nutrition Problem: Inadequate oral intake Etiology: dysphagia Signs/Symptoms: per patient/family report Diet:  Diet Order             Diet regular Room service appropriate? Yes; Fluid consistency: Thin  Diet effective now                  DVT prophylaxis:  heparin injection 5,000  Units Start: 04/04/21 1000   Antimicrobials: None Fluid: Off IV fluids Consultants: Neurosurgery Family Communication: None at bedside  Status is: Inpatient  Remains  inpatient appropriate because: Pending SNF  Dispo: The patient is from: Home              Anticipated d/c is to: SNF              Patient currently is medically stable to d/c.   Difficult to place patient No     Infusions:   sodium chloride     methocarbamol (ROBAXIN) IV      Scheduled Meds:  sodium chloride   Intravenous Once   Chlorhexidine Gluconate Cloth  6 each Topical Daily   [START ON 04/12/2021] dexamethasone  2 mg Oral Q12H   dexamethasone  4 mg Oral Q8H   [START ON 04/10/2021] dexamethasone  4 mg Oral Q12H   docusate  100 mg Oral BID   heparin injection (subcutaneous)  5,000 Units Subcutaneous Q12H   lactose free nutrition  237 mL Oral TID WC   levothyroxine  50 mcg Oral Q0600   mouth rinse  15 mL Mouth Rinse BID   multivitamin with minerals  1 tablet Oral Daily   sodium chloride flush  3 mL Intravenous Q12H    PRN meds: acetaminophen **OR** acetaminophen, alum & mag hydroxide-simeth, HYDROcodone-acetaminophen, menthol-cetylpyridinium **OR** phenol, methocarbamol **OR** methocarbamol (ROBAXIN) IV, ondansetron **OR** ondansetron (ZOFRAN) IV, oxyCODONE, polyethylene glycol, sodium chloride flush   Antimicrobials: Anti-infectives (From admission, onward)    Start     Dose/Rate Route Frequency Ordered Stop   04/03/21 0700  ceFAZolin (ANCEF) IVPB 2g/100 mL premix        2 g 200 mL/hr over 30 Minutes Intravenous Every 8 hours 04/03/21 0225 04/03/21 1730   04/03/21 0225  ceFAZolin (ANCEF) IVPB 2g/100 mL premix  Status:  Discontinued        2 g 200 mL/hr over 30 Minutes Intravenous 30 min pre-op 04/03/21 0225 04/04/21 0808       Objective: Vitals:   04/08/21 0532 04/08/21 0846  BP: 119/78 126/80  Pulse: 63 69  Resp: 16 17  Temp: 98.2 F (36.8 C) 98.2 F (36.8 C)  SpO2: 98% 97%    Intake/Output Summary (Last 24 hours) at 04/08/2021 0934 Last data filed at 04/08/2021 0244 Gross per 24 hour  Intake 740.71 ml  Output 2100 ml  Net -1359.29 ml   Filed  Weights   04/01/21 1652 04/03/21 0200  Weight: 49.9 kg 50 kg   Weight change:  Body mass index is 15.82 kg/m.   Physical Exam: General exam: Pleasant, not in acute distress. Skin: No rashes, lesions or ulcers. HEENT: Atraumatic, normocephalic, no obvious bleeding Lungs: Clear to auscultation bilaterally CVS: Regular rate and rhythm, no murmur GI/Abd soft, nontender, nondistended, bowel sound present CNS: Alert, awake, oriented x3 Psychiatry: Mood appropriate Extremities: No pedal edema, no calf tenderness  Data Review: I have personally reviewed the laboratory data and studies available.  F/u labs ordered Unresulted Labs (From admission, onward)     Start     Ordered   04/02/21 2256  Prepare RBC (crossmatch)  (Adult Blood Administration - Red Blood Cells)  ONCE - STAT,   STAT       Question Answer Comment  # of Units 4 units   Transfusion Indications Emergent / Trauma   Transfusion Indications Surgery   Number of Units to Keep Ahead NO units ahead   Date/Time blood product needed now  If emergent release call blood bank Not emergent release      04/02/21 2256            Signed, Terrilee Croak, MD [facility] 04/08/2021

## 2021-04-08 NOTE — TOC Progression Note (Signed)
Transition of Care (TOC) - Progression Note    Patient Details  Name: Bradley Oliver MRN: [id number] Date of Birth: 30-Nov-1955  Transition of Care [facility]) CM/SW Contact  Joanne Chars, LCSW Phone Number: 04/08/2021, 4:10 PM  Clinical Narrative:   CSW met with pt and [name] to discuss bed offer at [facility].  Pt upset about discharge, states he is not ready.  Pt and brother asked for responses from [place] and [place].  [facility] does not have bed.  [facility] does offer bed but may not have bed actually available until Monday.  They are also contacting a friend at [facility] and hoping they may be able to get a bed offer there.   1400: CSW spoke with brother by phone.  He visited [facility], went by [facility] and will tour tomorrow.  [facility] first choice.       Expected Discharge Plan: [place] Barriers to Discharge: Continued Medical Work up  Expected Discharge Plan and Services Expected Discharge Plan: [place] In-house Referral: Clinical Social Work Discharge Planning Services: CM Consult   Living arrangements for the past 2 months: Single Family Home                                       Social Determinants of Health (SDOH) Interventions    Readmission Risk Interventions No flowsheet data found.

## 2021-04-08 NOTE — Progress Notes (Signed)
Subjective: Patient reports that he feels like he is continuing to improve. No acute events overnight.   Objective: Vital signs in last 24 hours: Temp:  [97.9 F (36.6 C)-98.2 F (36.8 C)] 98.2 F (36.8 C) (11/17 0532) Pulse Rate:  [63-93] 63 (11/17 0532) Resp:  [16-18] 16 (11/17 0532) BP: (101-119)/(69-78) 119/78 (11/17 0532) SpO2:  [98 %-100 %] 98 % (11/17 0532)  Intake/Output from previous day: 11/16 0701 - 11/17 0700 In: 980.7 [P.O.:360; I.V.:620.7] Out: 2100 [Urine:2100] Intake/Output this shift: No intake/output data recorded.  Physical Exam: Patient is awake, A/O X 4, conversant, and in good spirits. He is in NAD and his VSS. Speech is fluent and appropriate. MAEW. Sensation to light touch is continuing to improve, BUE > BLE. PERLA, EOMI. CNs grossly intact. Dressing is intact with old drainage. Incision is well approximated with no drainage, erythema, or edema.  Lab Results: No results for input(s): WBC, HGB, HCT, PLT in the last 72 hours. BMET No results for input(s): NA, K, CL, CO2, GLUCOSE, BUN, CREATININE, CALCIUM in the last 72 hours.  Studies/Results: No results found.  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 Patient is s/p T4-T6 with T5 transverse decompression, T3-T7 pedicle screw instrumentation on 04/03/2021. He is continuing to improve and continues to report increased strength and decreased numbness. He has appropriate incisional pain. The patient continues to be stable and improving as expected from a neurosurgical perspective. Continue pain management. Continue to encourage mobilization and working with PT/OT. PT/OT recommending CIR placement for later convalescence. Inpatient Rehab Admissions Coordinator saw patient yesterday and they currently do not have a bed for the patient. Considering [facility] SNF.   -Begin down titration of dexamethasone 4 mg PO QID over the next 5 days to dexamethasone 2 mg  PO BID     LOS: 6 days     Marvis Moeller, DNP, NP-C 04/08/2021, 8:20 AM

## 2021-04-09 DIAGNOSIS — G9520 Unspecified cord compression: Secondary | ICD-10-CM | POA: Diagnosis not present

## 2021-04-09 LAB — RESP PANEL BY RT-PCR (FLU A&B, COVID) ARPGX2
Influenza A by PCR: NEGATIVE
Influenza B by PCR: NEGATIVE
SARS Coronavirus 2 by RT PCR: NEGATIVE

## 2021-04-09 MED ORDER — DEXAMETHASONE 2 MG PO TABS: ORAL_TABLET | ORAL | Status: DC

## 2021-04-09 MED ORDER — DOCUSATE SODIUM 50 MG/5ML PO LIQD
100.0000 mg | Freq: Two times a day (BID) | ORAL | 0 refills | Status: DC
Start: 2021-04-09 — End: 2021-04-27

## 2021-04-09 MED ORDER — POLYETHYLENE GLYCOL 3350 17 G PO PACK: 17.0000 g | PACK | Freq: Every day | ORAL | 0 refills | Status: AC | PRN

## 2021-04-09 MED ORDER — HYDROCODONE-ACETAMINOPHEN 10-325 MG PO TABS: 1.0000 | ORAL_TABLET | Freq: Four times a day (QID) | ORAL | 0 refills | Status: AC | PRN

## 2021-04-09 MED ORDER — METHOCARBAMOL 500 MG PO TABS: 500.0000 mg | ORAL_TABLET | Freq: Four times a day (QID) | ORAL | 0 refills | Status: AC | PRN

## 2021-04-09 NOTE — Plan of Care (Signed)
  Problem: Coping: Goal: Level of anxiety will decrease Outcome: Progressing  Pt educated on disease process, and ways to cope with anxiety.

## 2021-04-09 NOTE — Progress Notes (Signed)
Occupational Therapy Treatment Patient Details Name: Bradley Oliver. Lewin MRN: [id number] DOB: 14-May-1956 Today's Date: 04/09/2021   History of present illness 65 y/o male presented to [facility] ED on 04/01/21 following fall due to weakness/numbness of bilateral LE weakness. MRI T-spine showed large destructive and infiltrative metastatic deposit centered at the L paraspinous region involvement of T4-T7 vertebral bodies, associated epidural extension with severe spinal stenosis, cord compression and cord signal changes at levels of T4-7, metatstatic lesions of L lung, associated pathologic fx of T5, and T11 vertebral body enhancing lesion. MRI L-spine showed 8 mm enhancing lesion within inferior aspect of L5 vertebral body suspicious for metastatic lesion. S/p T3-7 fusion, T4-6 laminectomy, T5 transpedicular decompresion on 11/11. 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   OT comments  Patient received in bed and appeared agitated over breakfast and amount of visitors/hospital staff that has been in his room in the last 2 days. Patient was able to get to EOB and was agreeable to grooming seated on EOB. Patient performed brushing teeth with his eyes clothes to demonstrate his ability to maintain sitting balance on EOB. Patient declined standing but was willing to address sitting balance on EOB.  Patient was able to get back to supine and pull self up in bed. Patient was limited by complaints of back and right shoulder pain. Acute OT to continue to follow.    Recommendations for follow up therapy are one component of a multi-disciplinary discharge planning process, led by the attending physician.  Recommendations may be updated based on patient status, additional functional criteria and insurance authorization.    Follow Up Recommendations  Skilled nursing-short term rehab (<3 hours/day)    Assistance Recommended at Discharge  Frequent or constant Supervision/Assistance  Equipment Recommendations  BSC/3in1;Wheelchair (measurements OT);Wheelchair cushion (measurements OT);Hospital bed    Recommendations for Other Services      Precautions / Restrictions Precautions Precautions: Back Precaution Booklet Issued: No Precaution Comments: hemovac d/c, had to reinforce that we did not want to do too much straining and resistive exercises even with legs to protect his back right now. No brace needed per orders Restrictions Weight Bearing Restrictions: No (Simultaneous filing. User may not have seen previous data.)       Mobility Bed Mobility Overal bed mobility: Needs Assistance Bed Mobility: Rolling;Sidelying to Sit;Sit to Sidelying Rolling: Supervision Sidelying to sit: Min guard Supine to sit: Min guard;HOB elevated Sit to supine: HOB elevated;Min guard   General bed mobility comments: adament to try to perform himself    Transfers                         Balance Overall balance assessment: Needs assistance Sitting-balance support: Feet supported;Bilateral upper extremity supported;No upper extremity supported Sitting balance-Leahy Scale: Fair Sitting balance - Comments: performed grooming seated on eob                                   ADL either performed or assessed with clinical judgement   ADL Overall ADL's : Needs assistance/impaired     Grooming: Wash/dry face;Oral care;Brushing hair;Sitting;Supervision/safety Grooming Details (indicate cue type and reason): performed grooming seated on EOB                               General ADL Comments:  patient got upset with OT when mentioned attempting grooming while standing    Extremity/Trunk Assessment Upper Extremity Assessment LUE Deficits / Details: severe pain with WB/reaching            Vision       Perception     Praxis      Cognition Arousal/Alertness: Awake/alert Behavior During  Therapy: Agitated;Flat affect;Impulsive;Anxious Overall Cognitive Status: Impaired/Different from baseline Area of Impairment: Safety/judgement;Awareness                         Safety/Judgement: Decreased awareness of safety;Decreased awareness of deficits Awareness: Emergent Problem Solving: Difficulty sequencing General Comments: patient agitated over breakfast and amount of visits over last 2 days          Exercises     Shoulder Instructions       General Comments      Pertinent Vitals/ Pain       Pain Assessment: Faces Faces Pain Scale: Hurts whole lot Pain Location: R scapula to spine Pain Descriptors / Indicators: Aching;Grimacing;Guarding Pain Intervention(s): Limited activity within patient's tolerance;Monitored during session  Home Living                                          Prior Functioning/Environment              Frequency  Min 2X/week        Progress Toward Goals  OT Goals(current goals can now be found in the care plan section)  Progress towards OT goals: Progressing toward goals  Acute Rehab OT Goals Patient Stated Goal: get out of bed independently OT Goal Formulation: With patient Potential to Achieve Goals: Fair ADL Goals Pt Will Perform Grooming: with set-up;bed level Pt Will Transfer to Toilet: with +2 assist;with max assist;stand pivot transfer;bedside commode Additional ADL Goal #1: pt will complete sit<>stand min (A) with RW as precursor to adls Additional ADL Goal #2: pt will complete basic transfer total +2 mod (A) as precursor to adls Additional ADL Goal #3: pt will static sit min guard (A) as precursor to basic transfer  Plan Discharge plan remains appropriate    Co-evaluation                 AM-PAC OT "6 Clicks" Daily Activity     Outcome Measure   Help from another person eating meals?: None Help from another person taking care of personal grooming?: A Little Help from another  person toileting, which includes using toliet, bedpan, or urinal?: A Lot Help from another person bathing (including washing, rinsing, drying)?: A Lot Help from another person to put on and taking off regular upper body clothing?: A Little Help from another person to put on and taking off regular lower body clothing?: A Lot 6 Click Score: 16    End of Session    OT Visit Diagnosis: Unsteadiness on feet (R26.81);Muscle weakness (generalized) (M62.81);Pain Pain - Right/Left: Right Pain - part of body: Shoulder   Activity Tolerance Patient limited by pain   Patient Left in bed;with call bell/phone within reach;with bed alarm set   Nurse Communication Mobility status;Other (comment)        Time: 7622-6333 OT Time Calculation (min): 30 min  Charges: OT General Charges $OT Visit: 1 Visit OT Treatments $Self Care/Home Management : 8-22 mins $Therapeutic Activity: 8-22 mins  Lodema Hong, OTA Acute  Rehabilitation Services  Pager [phone] Office [place] 04/09/2021, 10:29 AM

## 2021-04-09 NOTE — Discharge Summary (Signed)
Physician Discharge Summary  Bradley Oliver. 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 [name] is a 65 y.o. 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 Patient presented to ED on 11/10 after a fall at home in the setting of weakness/numbness of bilateral lower extremities.  D-dimer was elevated, CTA chest was negative for PE.  It showed medial left lung mass extending to mediastinum.   MRI of lumbar spine showed 8 mm enhancing lesion within the inferior aspect of L5 vertebral body suspicious for metastatic lesion.   MRI of thoracic spine showed large destructive and infiltrative metastatic deposit centered at the left paraspinous region with involvement of T4-T7 vertebral bodies.  Associated epidural extension with severe spinal stenosis, cord compression and cord signal changes at the level of T4-5 through T6-7. Metastatic lesion extends to involve the adjacent posterior medial aspect of the left lung as well as the left adjacent mediastinum and left hilar/perihilar region.  Associated pathologic fracture of T5.  1 cm enhancing lesion within the T11 vertebral body suspicious for metastatic lesion.  Neurosurgery consulted by ED physician.  Patient was transferred to [facility], underwent decompression T4-T6 with T5 transverse decompression, T3-T7 pedicle screw  instrumentation.  Subjective: Patient was seen and examined this morning.  Propped up in bed.  Not in distress.  Pain improving.  Hospital course: Acute Cord compression due to underlying metastatic disease -MRI of the T-spine showed significant cord compression at T4-5 through T6-7 -S/p decompression per neurosurgery on 11/12 -Currently on Decadron tapering course.  Per neurosurgery, 04/09/2021 - dexamethasone 4mg  PO TID 11/19-11/20/2022  - dexamethasone 4mg  PO BID, 04/12/2021 - Begin dexamethasone 2mg  PO BID, continue until directed otherwise -Gradually improving physical strength and coordination.  Continue to work with PT at SNF   Chronic dysphagia likely secondary to cancer -Speech therapy evaluation obtained.  Currently on regular diet.  Encourage oral hydration.     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   Left pleural effusion -Seen on CTA chest, appears partially loculated -Thoracentesis was attempted by IR but could not be performed as no fluid collection for safe approach was found  -Respiratory status remained stable at this time.  Not requiring supplemental oxygen.  Elevated D-dimer -Likely from underlying cancer -CTA chest negative for PE -Venous duplex of lower extremities negative for DVT   Hypothyroidism -Continue Synthroid    Allergies as of 04/09/2021   No Known Allergies      Medication List     TAKE these medications    dexamethasone 2 MG tablet Commonly known as: DECADRON 04/09/2021 - dexamethasone 4mg  PO TID 11/19-11/20/2022  - dexamethasone 4mg  PO BID,  04/12/2021 -continue  dexamethasone 2mg  PO BID, continue until neurosurgery  follow-up.   docusate 50 MG/5ML liquid Commonly known as: COLACE Take 10 mLs (100 mg total) by mouth 2 (two) times daily.   HYDROcodone-acetaminophen 10-325 MG tablet Commonly known as: NORCO Take 1 tablet by mouth every 6 (six) hours as needed for up to 3 days for moderate pain ((score 4 to 6)).   levothyroxine 50 MCG tablet Commonly known as: SYNTHROID Take 50 mcg by mouth daily.   lidocaine 5 % Commonly known as: LIDODERM Place 1 patch onto the skin daily as needed (pain).   methocarbamol 500 MG tablet Commonly known as: ROBAXIN Take 1 tablet (500 mg total) by mouth every 6 (six) hours as needed for up to 3 days for muscle spasms.   Multivitamin Adult Tabs Take 1 tablet by mouth daily. with Omega 3   polyethylene glycol 17 g packet Commonly known as: MIRALAX / GLYCOLAX Take 17 g by mouth daily as needed for mild constipation.   UNABLE TO FIND Take 1 tablet by mouth 2 (two) times daily as needed (for severe pain). CBD gummies Highest Strength               Discharge Care Instructions  (From admission, onward)           Start     Ordered   04/09/21 0000  Discharge wound care:       Comments: Encourage changing position every 2 hours.   04/09/21 1020            Discharge Instructions:  Diet Recommendation:  Discharge Diet Orders (From admission, onward)     Start     Ordered   04/09/21 0000  Diet general       Comments: Diet Orders (From admission, onward)    Start     Ordered   04/05/21 0937  Diet regular Room service appropriate? Yes; Fluid  consistency: Thin  Diet effective now       Question Answer Comment  Room service appropriate? Yes   Fluid consistency: Thin     04/05/21 0937       04/09/21 1020              @BRDDSCINSTRUCTIONS @  Follow ups:    Follow-up Information     [facility] DEPT. Go to .   Specialty: Emergency Medicine Why: As needed, If symptoms worsen Contact  information: [place] [phone] [address] [place], [place]. Schedule an appointment as soon as possible for a visit in 2 days.   Specialty: Family Medicine Why: If you do not have PCP, please see PCP above, Follow up from ER visit Contact information: [address] [address] [phone]                 Wound care:   Incision (Closed) 04/03/21 Back (Active)  Date First Assessed/Time First Assessed: 04/03/21 0105   Location: Back    Assessments 04/03/2021  1:30 AM 04/07/2021  9:40 PM  Dressing Type Honeycomb Honeycomb  Dressing Clean;Dry;Intact Old drainage (marked)  Dressing Change Frequency -- PRN  Site / Wound Assessment Dressing in place / Unable to assess Dressing in place / Unable to assess  Drainage Amount None Minimal     No Linked orders to display     Pressure Injury 04/03/21 Sacrum Medial Stage 1 -  Intact skin with non-blanchable redness  of a localized area usually over a bony prominence. (Active)  Date First Assessed/Time First Assessed: 04/03/21 0240   Location: Sacrum  Location Orientation: Medial  Staging: Stage 1 -  Intact skin with non-blanchable redness of a localized area usually over a bony prominence.  Present on Admission: Yes    Assessments 04/03/2021  2:40 AM 04/08/2021  8:15 AM  Dressing Type Foam - Lift dressing to assess site every shift --  Dressing Clean;Dry;Intact Clean;Dry;Intact  Dressing Change Frequency Every 3 days Every 3 days  Site / Wound Assessment Clean;Dry;Pink Clean;Dry;Dressing in place / Unable to assess  Peri-wound Assessment Intact Intact  Margins -- Attached edges (approximated)  Drainage Amount None None  Treatment Off loading --     No Linked orders to display    Discharge Exam:   Vitals:   04/08/21 0846 04/08/21 2102 04/09/21 0426 04/09/21 0823  BP: 126/80 124/82 (!) 124/92 (!) 148/93  Pulse: 69 73  64 69  Resp: 17 20  16   Temp: 98.2 F (36.8 C) 98.6 F (37 C) 97.7 F (36.5 C) 97.9 F (36.6 C)  TempSrc: Oral   Oral  SpO2: 97% 100% 97% 97%  Weight:      Height:        Body mass index is 15.82 kg/m.  General exam: Pleasant, not in acute distress. Skin: No rashes, lesions or ulcers. HEENT: Atraumatic, normocephalic, no obvious bleeding Lungs: Clear to auscultation bilaterally CVS: Regular rate and rhythm, no murmur GI/Abd soft, nontender, nondistended, bowel sound present CNS: Alert, awake, oriented x3 Psychiatry: Mood appropriate Extremities: No pedal edema, no calf tenderness  Time coordinating discharge: 35 minutes   The results of significant diagnostics from this hospitalization (including imaging, microbiology, ancillary and laboratory) are listed below for reference.    Procedures and Diagnostic Studies:   CT Angio Chest PE W and/or Wo Contrast  Result Date: 04/02/2021 CLINICAL DATA:  Weakness, T4-6 tumor, history of right tonsillar cancer, elevated D-dimer, evaluate for PE EXAM: CT ANGIOGRAPHY CHEST WITH CONTRAST TECHNIQUE: Multidetector CT imaging of the chest was performed using the standard protocol during bolus administration of intravenous contrast. Multiplanar CT image reconstructions and MIPs were obtained to evaluate the vascular anatomy. CONTRAST:  45mL OMNIPAQUE IOHEXOL 350 MG/ML SOLN COMPARISON:  None. FINDINGS: Cardiovascular: Satisfactory opacification of the bilateral pulmonary arteries to the lobar level. Narrowing of the left main pulmonary artery due to tumor. No evidence of pulmonary embolism. Although not tailored for evaluation of the thoracic aorta, there is no evidence thoracic aortic aneurysm. Mild atherosclerotic calcifications. The heart is normal in size.  No pericardial effusion. Mediastinum/Nodes: Tumor along the anterior mediastinum/AP window and extending to the medial lung. This will be described below. Visualized thyroid is unremarkable.  Lungs/Pleura: Mass involving the medial aspect of the left upper lobe and superior segment left lower lobe, extending to the mediastinum, measuring 14.1 x 4.7 cm (series 6/image 93). Moderate left pleural effusion, partially loculated, with associated apical capping. Associated left lower lobe opacity, likely compressive atelectasis. Trace right pleural effusion. 9 mm nodule in the superior segment right lower lobe (series 7/image 36). No pneumothorax. Upper Abdomen: Visualized upper abdomen is grossly unremarkable. Musculoskeletal: Moderate compression fracture at T5, pathologic. Total spine MRI has been performed and further evaluation will be deferred to that study. Otherwise, no focal osseous lesions. Review of the MIP images confirms the above findings. IMPRESSION: No evidence of pulmonary embolism. 14.1 x 4.7 cm mass involving the medial left lung and extending to the  mediastinum, as above. Moderate left pleural effusion, partially loculated, with apical capping. Trace right pleural effusion. Moderate pathologic compression fracture at T5. Additional spinal involvement will be described on dedicated total spine MR, reported separately. Aortic Atherosclerosis (ICD10-I70.0). Electronically Signed   By: Julian Hy M.D.   On: 04/02/2021 01:31   MR Cervical Spine W or Wo Contrast  Result Date: 04/02/2021 CLINICAL DATA:  Initial evaluation for metastatic disease evaluation. EXAM: MRI CERVICAL SPINE WITHOUT AND WITH CONTRAST TECHNIQUE: Multiplanar and multiecho pulse sequences of the cervical spine, to include the craniocervical junction and cervicothoracic junction, were obtained without and with intravenous contrast. CONTRAST:  5.38mL GADAVIST GADOBUTROL 1 MMOL/ML IV SOLN COMPARISON:  None. FINDINGS: Alignment: Physiologic with preservation of the normal cervical lordosis. No listhesis. Vertebrae: Vertebral body height maintained without acute or chronic fracture. Bone marrow signal intensity within  normal limits. No discrete osseous lesions or evidence for metastatic disease. No abnormal edema or enhancement. Cord: Normal signal morphology. Posterior Fossa, vertebral arteries, paraspinal tissues: Visualized brain and posterior fossa within normal limits. Craniocervical junction normal. Paraspinous soft tissues within normal limits. Normal flow voids seen within the vertebral arteries bilaterally. Disc levels: C2-C3: Right paracentral disc protrusion indents the right ventral thecal sac. No significant spinal stenosis or cord impingement. Foramina remain patent. C3-C4: Right paracentral disc protrusion indents the right ventral thecal sac. No significant spinal stenosis or cord impingement. Foramina remain patent. C4-C5: Mild degenerative intervertebral disc space narrowing. Superimposed right paracentral disc protrusion indents the ventral thecal sac, contacting and minimally flattening the ventral cord. Thecal sac remains widely patent without significant spinal stenosis. Bilateral uncovertebral spurring with resultant mild right C5 foraminal stenosis. Left neural foramen remains patent. C5-C6: Mild degenerative intervertebral disc space narrowing. Right eccentric disc bulge with associated right greater than left uncovertebral spurring. Broad posterior disc osteophyte mildly flattens the ventral thecal sac without significant spinal stenosis. Moderate right C6 foraminal narrowing. Left neural foramina remains patent. C6-C7: Mild degenerative intervertebral disc space narrowing. Right eccentric disc osteophyte complex mildly flattens the ventral thecal sac without significant spinal stenosis. Mild to moderate right C7 foraminal narrowing. Left neural foramen remains patent. C7-T1: Minimal disc bulge. Mild facet hypertrophy. No spinal stenosis. Foramina remain patent. IMPRESSION: 1. No evidence for metastatic disease within the cervical spine. 2. Small right paracentral disc protrusions at C2-3 through C4-5  without significant spinal stenosis. 3. Right eccentric disc osteophyte complexes at C5-6 and C6-7 with resultant mild to moderate right C6 and C7 foraminal stenosis as above. Electronically Signed   By: Jeannine Boga M.D.   On: 04/02/2021 01:05   MR THORACIC SPINE W WO CONTRAST  Result Date: 04/02/2021 CLINICAL DATA:  Initial metastatic evaluation. EXAM: MRI THORACIC WITHOUT AND WITH CONTRAST TECHNIQUE: Multiplanar and multiecho pulse sequences of the thoracic spine were obtained without and with intravenous contrast. CONTRAST:  5.53mL GADAVIST GADOBUTROL 1 MMOL/ML IV SOLN COMPARISON:  None available. FINDINGS: Alignment: Dextroscoliosis. Focal kyphotic angulation of the thoracic spine at the level of T5. No listhesis. Vertebrae: Large contiguous infiltrative and destructive metastatic deposit seen involving the upper-midthoracic spine. Lesion involves the T4 through T7 vertebral bodies, and is centered at the left paraspinous region. Although measurements are somewhat difficult, lesion measures approximately 7.7 x 8.4 x 10.9 cm in greatest dimensions (AP by transverse by craniocaudad). Lesion involves the left hilar/perihilar region, tracking posteriorly along the posteromedial left lung, involving the adjacent left mediastinum and partially encasing the aorta. Direct extension of this mass into the adjacent posterior ribs  and T4 through T7 vertebral bodies, with involvement of their posterior elements at several levels. There is an associated pathologic fracture of T5 with approximately 60% height loss. Epidural extension of tumor to involve the spinal canal, extending from T3 through T7. This is most pronounced at the level of T5 where there is circumferential epidural involvement. Associated severe spinal stenosis at the level of T5 with cord compression and associated cord signal changes (series 17, image 8). Thecal sac measures 4 mm in AP diameter at its most narrow point. Tumor also extends to  involve the left neural foramina at T4-5 through T6-7, with probable early extension into the left T3-4 and T7-8 foramina. Involvement of the right T4-5 and T6-7 neural foramina noted as well. There is an additional 1 cm T1 hypointense enhancing lesion within the T11 vertebral body, indeterminate, but could reflect an additional metastatic lesion (series 19, image 7). Otherwise, no other metastatic disease seen within the thoracic spine. Multiple scattered benign hemangiomata noted. No other acute or chronic fracture. Cord: Epidural tumor with associated severe spinal stenosis and cord signal changes at T4-5 through T6-7 as above, most pronounced at the T5 level. Otherwise, signal intensity within the thoracic cord is within normal limits. No other epidural involvement. Paraspinal and other soft tissues: Metastatic lesion involving the left paraspinous and posteromedial left lung at T4 through T7 as above. Associated atelectasis with irregular and partially loculated left pleural effusion partially visualized. Direct extension of tumor to involve the left posterior soft tissues of the left mid back noted as well. Disc levels: No significant underlying disc pathology for age. No other significant stenosis or impingement. IMPRESSION: 1. Large destructive and infiltrative metastatic deposit centered at the left paraspinous region, with involvement of the T4 through T7 vertebral bodies as above. Associated epidural extension with severe spinal stenosis, cord compression, and cord signal changes at the levels of T4-5 through T6-7. 2. Metastatic lesion extends to involve the adjacent posteromedial aspect of the left lung as well as the left adjacent mediastinum and left hilar/perihilar region, also seen on corresponding CTA of the chest. 3. Associated pathologic fracture of T5 with up to 60% height loss. 4. 1 cm enhancing lesion within the T11 vertebral body, indeterminate, but could reflect an additional metastatic  lesion. Attention at follow-up recommended. Critical Value/emergent results were called by telephone at the time of interpretation on 04/02/2021 at 1:32 am to provider Dr. Florina Ou , who verbally acknowledged these results. Electronically Signed   By: Jeannine Boga M.D.   On: 04/02/2021 01:41   MR Lumbar Spine W Wo Contrast  Result Date: 04/02/2021 CLINICAL DATA:  Initial evaluation for metastatic evaluation. EXAM: MRI LUMBAR SPINE WITHOUT AND WITH CONTRAST TECHNIQUE: Multiplanar and multiecho pulse sequences of the lumbar spine were obtained without and with intravenous contrast. CONTRAST:  5.23mL GADAVIST GADOBUTROL 1 MMOL/ML IV SOLN COMPARISON:  None available. FINDINGS: Segmentation: Standard. Lowest well-formed disc space labeled the L5-S1 level. Alignment: Physiologic with preservation of the normal lumbar lordosis. No listhesis. Vertebrae: Vertebral body height maintained without acute or chronic fracture. Prominent benign hemangioma noted within the T12 vertebral body. There is a 8 mm T1 hypointense enhancing lesion within the inferior aspect of the L5 vertebral body, nonspecific, but could reflect a small metastatic lesion (series 23, image 6). No other evidence for metastatic disease seen within the lumbar spine. Conus medullaris and cauda equina: Conus extends to the L1 level. Conus and cauda equina appear normal. Paraspinal and other soft tissues: Mild diffuse  edema within the lower posterior paraspinous musculature, nonspecific, but could reflect muscular injury and/or strain. Soft tissues demonstrate no other acute finding. Prominent distension of the partially visualized urinary bladder. Visualized visceral structures otherwise unremarkable. Disc levels: L1-2:  Negative interspace.  Mild facet hypertrophy.  No stenosis. L2-3:  Negative interspace.  Mild facet hypertrophy.  No stenosis. L3-4:  Negative interspace.  Mild facet hypertrophy.  No stenosis. L4-5: Disc desiccation with minimal disc  bulge. Superimposed tiny central disc protrusion with annular fissure. Mild facet hypertrophy. Mild narrowing of the lateral recesses. Central canal remains patent. No foraminal stenosis. L5-S1: Degenerative intervertebral disc space narrowing with disc desiccation and mild diffuse disc bulge. Mild reactive endplate spurring. Mild facet hypertrophy. No significant spinal stenosis. Foramina remain patent. IMPRESSION: 1. 8 mm enhancing lesion within the inferior aspect of the L5 vertebral body, nonspecific, but could reflect a small metastatic lesion. Attention at follow-up recommended. 2. No other evidence for metastatic disease within the lumbar spine. 3. Mild diffuse edema within the lower posterior paraspinous musculature, nonspecific, but could reflect muscular injury and/or strain. 4. Prominent distension of the visualized urinary bladder. Query urinary retention. 5. Mild for age spondylosis without significant stenosis. Electronically Signed   By: Jeannine Boga M.D.   On: 04/02/2021 01:51   [place] CHEST (PLEURAL EFFUSION)  Result Date: 04/02/2021 CLINICAL DATA:  Pleural effusion. EXAM: CHEST ULTRASOUND COMPARISON:  CTA Chest earlier same day FINDINGS: Small left pleural effusion.  No discernible right effusion. IMPRESSION: Small left pleural effusion. Inadequate volume for safe image guided thoracentesis. Electronically Signed   By: Misty Stanley M.D.   On: 04/02/2021 12:23   DG Thoracic Spine 1 View  Result Date: 04/03/2021 CLINICAL DATA:  T3-7 fusion EXAM: OPERATIVE THORACIC SPINE 1 VIEW(S) COMPARISON:  None. FLUOROSCOPY TIME:  36 seconds FINDINGS: Intraoperative fluoroscopic image during T3-7 fusion with pedicle screws. IMPRESSION: Intraoperative fluoroscopic images, as above. Electronically Signed   By: Julian Hy M.D.   On: 04/03/2021 02:56   DG C-Arm 1-60 Min-No Report  Result Date: 04/03/2021 CLINICAL DATA:  T3-7 fusion EXAM: OPERATIVE THORACIC SPINE 1 VIEW(S) COMPARISON:  None.  FLUOROSCOPY TIME:  36 seconds FINDINGS: Intraoperative fluoroscopic image during T3-7 fusion with pedicle screws. IMPRESSION: Intraoperative fluoroscopic images, as above. Electronically Signed   By: Julian Hy M.D.   On: 04/03/2021 02:56   DG C-Arm 1-60 Min-No Report  Result Date: 04/03/2021 CLINICAL DATA:  T3-7 fusion EXAM: OPERATIVE THORACIC SPINE 1 VIEW(S) COMPARISON:  None. FLUOROSCOPY TIME:  36 seconds FINDINGS: Intraoperative fluoroscopic image during T3-7 fusion with pedicle screws. IMPRESSION: Intraoperative fluoroscopic images, as above. Electronically Signed   By: Julian Hy M.D.   On: 04/03/2021 02:56   VAS [place] LOWER EXTREMITY VENOUS (DVT)  Result Date: 04/02/2021  Lower Venous DVT Study Patient Name:  JORGEN WOLFINGER. [facility]  Date of Exam:   04/02/2021 Medical Rec #: [id number]          Accession #:    [id number] Date of Birth: [birth year]          Patient Gender: M Patient Age:   85 years Exam Location:  [facility] Procedure:      VAS [place] LOWER EXTREMITY VENOUS (DVT) Referring Phys: Wandra Feinstein RATHORE --------------------------------------------------------------------------------  Indications: Elevated Ddimer.  Risk Factors: Cancer. Limitations: Patient positioning and body habitus. Comparison Study: No prior studies. Performing Technologist: Oliver Hum RVT  Examination Guidelines: A complete evaluation includes B-mode imaging, spectral Doppler, color Doppler, and power Doppler as needed of all accessible  portions of each vessel. Bilateral testing is considered an integral part of a complete examination. Limited examinations for reoccurring indications may be performed as noted. The reflux portion of the exam is performed with the patient in reverse Trendelenburg.  +---------+---------------+---------+-----------+----------+--------------+ RIGHT    CompressibilityPhasicitySpontaneityPropertiesThrombus Aging  +---------+---------------+---------+-----------+----------+--------------+ CFV      Full           Yes      Yes                                 +---------+---------------+---------+-----------+----------+--------------+ SFJ      Full                                                        +---------+---------------+---------+-----------+----------+--------------+ FV Prox  Full                                                        +---------+---------------+---------+-----------+----------+--------------+ FV Mid   Full                                                        +---------+---------------+---------+-----------+----------+--------------+ FV DistalFull                                                        +---------+---------------+---------+-----------+----------+--------------+ PFV      Full                                                        +---------+---------------+---------+-----------+----------+--------------+ POP      Full           Yes      Yes                                 +---------+---------------+---------+-----------+----------+--------------+ PTV      Full                                                        +---------+---------------+---------+-----------+----------+--------------+ PERO     Full                                                        +---------+---------------+---------+-----------+----------+--------------+   +---------+---------------+---------+-----------+----------+--------------+ LEFT  CompressibilityPhasicitySpontaneityPropertiesThrombus Aging +---------+---------------+---------+-----------+----------+--------------+ CFV      Full           Yes      Yes                                 +---------+---------------+---------+-----------+----------+--------------+ SFJ      Full                                                         +---------+---------------+---------+-----------+----------+--------------+ FV Prox  Full                                                        +---------+---------------+---------+-----------+----------+--------------+ FV Mid   Full                                                        +---------+---------------+---------+-----------+----------+--------------+ FV DistalFull                                                        +---------+---------------+---------+-----------+----------+--------------+ PFV      Full                                                        +---------+---------------+---------+-----------+----------+--------------+ POP      Full           Yes      Yes                                 +---------+---------------+---------+-----------+----------+--------------+ PTV      Full                                                        +---------+---------------+---------+-----------+----------+--------------+ PERO     Full                                                        +---------+---------------+---------+-----------+----------+--------------+     Summary: RIGHT: - There is no evidence of deep vein thrombosis in the lower extremity.  - No cystic structure found in the popliteal fossa.  LEFT: - There is no evidence of deep vein thrombosis in the lower extremity.  - No cystic structure found in the  popliteal fossa.  *See table(s) above for measurements and observations. Electronically signed by Servando Snare MD on 04/02/2021 at 4:26:00 PM.    Final      Labs:   Basic Metabolic Panel: Recent Labs  Lab 04/02/21 2341 04/03/21 0225 04/04/21 0340  NA 136  --  133*  K 4.2  --  3.8  CL  --   --  101  CO2  --   --  25  GLUCOSE  --   --  148*  BUN  --   --  17  CREATININE  --  0.92 0.69  CALCIUM  --   --  8.5*   GFR Estimated Creatinine Clearance: 66 mL/min (by C-G formula based on SCr of 0.69 mg/dL). Liver Function  Tests: Recent Labs  Lab 04/04/21 0340  AST 31  ALT 11  ALKPHOS 58  BILITOT 0.8  PROT 5.2*  ALBUMIN 3.0*   No results for input(s): LIPASE, AMYLASE in the last 168 hours. No results for input(s): AMMONIA in the last 168 hours. Coagulation profile No results for input(s): INR, PROTIME in the last 168 hours.  CBC: Recent Labs  Lab 04/02/21 2341 04/03/21 0225 04/04/21 0340  WBC  --  10.6* 11.7*  HGB 9.2* 12.8* 12.2*  HCT 27.0* 37.1* 35.8*  MCV  --  86.7 85.6  PLT  --  205 224   Cardiac Enzymes: No results for input(s): CKTOTAL, CKMB, CKMBINDEX, TROPONINI in the last 168 hours. BNP: Invalid input(s): POCBNP CBG: No results for input(s): GLUCAP in the last 168 hours. D-Dimer No results for input(s): DDIMER in the last 72 hours. Hgb A1c No results for input(s): HGBA1C in the last 72 hours. Lipid Profile No results for input(s): CHOL, HDL, LDLCALC, TRIG, CHOLHDL, LDLDIRECT in the last 72 hours. Thyroid function studies No results for input(s): TSH, T4TOTAL, T3FREE, THYROIDAB in the last 72 hours.  Invalid input(s): FREET3 Anemia work up No results for input(s): VITAMINB12, FOLATE, FERRITIN, TIBC, IRON, RETICCTPCT in the last 72 hours. Microbiology Recent Results (from the past 240 hour(s))  Resp Panel by RT-PCR (Flu A&B, Covid) Nasopharyngeal Swab     Status: None   Collection Time: 04/02/21  1:56 AM   Specimen: Nasopharyngeal Swab; Nasopharyngeal(NP) swabs in vial transport medium  Result Value Ref Range Status   SARS Coronavirus 2 by RT PCR NEGATIVE NEGATIVE Final    Comment: (NOTE) SARS-CoV-2 target nucleic acids are NOT DETECTED.  The SARS-CoV-2 RNA is generally detectable in upper respiratory specimens during the acute phase of infection. The lowest concentration of SARS-CoV-2 viral copies this assay can detect is 138 copies/mL. A negative result does not preclude SARS-Cov-2 infection and should not be used as the sole basis for treatment or other patient  management decisions. A negative result may occur with  improper specimen collection/handling, submission of specimen other than nasopharyngeal swab, presence of viral mutation(s) within the areas targeted by this assay, and inadequate number of viral copies(<138 copies/mL). A negative result must be combined with clinical observations, patient history, and epidemiological information. The expected result is Negative.  Fact Sheet for Patients:  EntrepreneurPulse.com.au  Fact Sheet for Healthcare Providers:  IncredibleEmployment.be  This test is no t yet approved or cleared by the [place] FDA and  has been authorized for detection and/or diagnosis of SARS-CoV-2 by FDA under an Emergency Use Authorization (EUA). This EUA will remain  in effect (meaning this test can be used) for the duration of the COVID-19 declaration under Section 564(b)(1)  of the Act, 21 U.S.C.section 360bbb-3(b)(1), unless the authorization is terminated  or revoked sooner.       Influenza A by PCR NEGATIVE NEGATIVE Final   Influenza B by PCR NEGATIVE NEGATIVE Final    Comment: (NOTE) The Xpert Xpress SARS-CoV-2/FLU/RSV plus assay is intended as an aid in the diagnosis of influenza from Nasopharyngeal swab specimens and should not be used as a sole basis for treatment. Nasal washings and aspirates are unacceptable for Xpert Xpress SARS-CoV-2/FLU/RSV testing.  Fact Sheet for Patients: EntrepreneurPulse.com.au  Fact Sheet for Healthcare Providers: IncredibleEmployment.be  This test is not yet approved or cleared by the [place] FDA and has been authorized for detection and/or diagnosis of SARS-CoV-2 by FDA under an Emergency Use Authorization (EUA). This EUA will remain in effect (meaning this test can be used) for the duration of the COVID-19 declaration under Section 564(b)(1) of the Act, 21 U.S.C. section 360bbb-3(b)(1),  unless the authorization is terminated or revoked.  Performed at [facility], [place] [address], [place], [place] [address]      Signed: Marlowe Aschoff Marshae Azam  [facility] 04/09/2021, 10:20 AM

## 2021-04-09 NOTE — TOC Transition Note (Signed)
Transition of Care [facility]) - CM/SW Discharge Note   Patient Details  Name: Janis Cuffe. Mizrahi MRN: [id number] Date of Birth: 1955-07-04  [facility] Care [facility]) CM/SW Contact:  Joanne Chars, LCSW Phone Number: 04/09/2021, 2:07 PM   Clinical Narrative:   Pt discharging to [place].  RN call [phone] for report.  PTAR called 1400.      Final next level of care: Skilled Nursing Facility Barriers to Discharge: Barriers Resolved   Patient Goals and CMS Choice   CMS Medicare.gov Compare Post Acute Care list provided to:: Patient    Discharge Placement              Patient chooses bed at:  [facility]) Patient to be transferred to facility by: [place] Name of family member notified: brother Roderic Palau Patient and family notified of of transfer: 04/09/21  Discharge Plan and Services In-house Referral: Clinical Social Work Discharge Planning Services: CM Consult                                 Social Determinants of Health (SDOH) Interventions     Readmission Risk Interventions No flowsheet data found.

## 2021-04-09 NOTE — TOC Progression Note (Signed)
Transition of Care (TOC) - Progression Note    Patient Details  Name: Bradley Oliver MRN: [id number] Date of Birth: 04-23-1956  [facility]) CM/SW Contact  Joanne Chars, LCSW Phone Number: 04/09/2021, 9:07 AM  Clinical Narrative:  CSW provided Navi with facility choice of [facility].  Auth approved: [id number].  5 days 11/17-11/21 with review on 11/21     Expected Discharge Plan: [place] Barriers to Discharge: Continued Medical Work up  Expected Discharge Plan and Services Expected Discharge Plan: [place] In-house Referral: Clinical Social Work Discharge Planning Services: CM Consult   Living arrangements for the past 2 months: Single Family Home                                       Social Determinants of Health (SDOH) Interventions    Readmission Risk Interventions No flowsheet data found.

## 2021-04-09 NOTE — Plan of Care (Signed)

## 2021-04-09 NOTE — Progress Notes (Addendum)
Subjective: Patient reports that he feels as though he is continuing to improve. He reported a focal spot of pain around the inferior angle of his right scapula. The pain is sharp and typically and reported to feel like "someone is drilling into my back." The pain is intermittent and  typically occurs when he is standing and using his walker.   Objective: Vital signs in last 24 hours: Temp:  [97.7 F (36.5 C)-98.6 F (37 C)] 97.7 F (36.5 C) (11/18 0426) Pulse Rate:  [64-73] 64 (11/18 0426) Resp:  [17-20] 20 (11/17 2102) BP: (124-126)/(80-92) 124/92 (11/18 0426) SpO2:  [97 %-100 %] 97 % (11/18 0426)  Intake/Output from previous day: 11/17 0701 - 11/18 0700 In: -  Out: 300 [Urine:300] Intake/Output this shift: No intake/output data recorded.  Physical Exam: Patient is awake, A/O X 4, conversant, and in good spirits. He is in NAD and his VSS. Speech is fluent and appropriate. MAEW. Sensation to light touch is continuing to improve. PERLA, EOMI. CNs grossly intact. Dressing is intact with old drainage. Incision is well approximated with no drainage, erythema, or edema.  Lab Results: No results for input(s): WBC, HGB, HCT, PLT in the last 72 hours. BMET No results for input(s): NA, K, CL, CO2, GLUCOSE, BUN, CREATININE, CALCIUM in the last 72 hours.  Studies/Results: No results found.  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 Patient is s/p T4-T6 with T5 transverse decompression, T3-T7 pedicle screw instrumentation on 04/03/2021. He is continuing to improve and continues to report increased strength and decreased numbness. He has appropriate incisional pain. Today, he reports a focal area of pain around the inferior angle of his right scapula. This is likely myofascial type pain. Continue pain management. Continue to encourage mobilization and working with PT/OT. PT/OT recommending SNF placement for later convalescence as CIR has  been denied. The patient's family is looking into alternative SNF for the patient as he was not happy with the initial recommendation.     -Continue down titration of dexamethasone to dexamethasone 2 mg PO BID 04/09/2021 - dexamethasone 4mg  PO TID 11/19-11/20/2022  - dexamethasone 4mg  PO BID 04/12/2021 - Begin dexamethasone 2mg  PO BID, continue until directed otherwise    LOS: 7 days     Marvis Moeller, DNP, NP-C 04/09/2021, 8:12 AM

## 2021-04-09 NOTE — TOC Progression Note (Addendum)
Transition of Care (TOC) - Initial/Assessment Note    Patient Details  Name: Bradley Oliver MRN: [id number] Date of Birth: 01/08/1956  Transition of Care [facility]) CM/SW Contact:    Milinda Antis, LCSWA Phone Number: 04/09/2021, 11:19 AM  Clinical Narrative:                 CSW spoke with the patient and his [name].  The brother visited the facility and is in agreement with the patient going to [facility].  Facility notified.  CSW requested that a rapid COVID test be ordered and collected asap so that the patient can d/c to the facility today.    Expected Discharge Plan: Skilled Nursing Facility Barriers to Discharge: Continued Medical Work up   Patient Goals and CMS Choice   CMS Medicare.gov Compare Post Acute Care list provided to:: Patient    Expected Discharge Plan and Services Expected Discharge Plan: [place] In-house Referral: Clinical Social Work Discharge Planning Services: CM Consult   Living arrangements for the past 2 months: [place] Expected Discharge Date: 04/09/21                                    Prior Living Arrangements/Services Living arrangements for the past 2 months: Single Family Home Lives with:: Parents, Siblings (37 y/o mother and sister) Patient language and need for interpreter reviewed:: Yes Do you feel safe going back to the place where you live?: Yes      Need for Family Participation in Patient Care: Yes (Comment) Care giver support system in place?: No (comment)   Criminal Activity/Legal Involvement Pertinent to Current Situation/Hospitalization: No - Comment as needed  Activities of Daily Living Home Assistive Devices/Equipment: None ADL Screening (condition at time of admission) Patient's cognitive ability adequate to safely complete daily activities?: Yes Is the patient deaf or have difficulty hearing?: No Does the patient have difficulty seeing, even when wearing glasses/contacts?:  No Does the patient have difficulty concentrating, remembering, or making decisions?: No Patient able to express need for assistance with ADLs?: Yes Does the patient have difficulty dressing or bathing?: Yes (secondary to worsening weakness) Independently performs ADLs?: No (secondary to worsening weakness) Communication: Independent Dressing (OT): Needs assistance Is this a change from baseline?: Change from baseline, expected to last >3 days Grooming: Needs assistance Is this a change from baseline?: Change from baseline, expected to last >3 days Feeding: Needs assistance Is this a change from baseline?: Change from baseline, expected to last >3 days Bathing: Needs assistance Is this a change from baseline?: Change from baseline, expected to last >3 days Toileting: Needs assistance Is this a change from baseline?: Change from baseline, expected to last >3days In/Out Bed: Needs assistance Is this a change from baseline?: Change from baseline, expected to last >3 days Walks in Home: Needs assistance Is this a change from baseline?: Change from baseline, expected to last >3 days Does the patient have difficulty walking or climbing stairs?: Yes (secondary to worsening weakness) Weakness of Legs: Both Weakness of Arms/Hands: None  Permission Sought/Granted   Permission granted to share information with : Yes, Verbal Permission Granted  Share Information with NAME: Bradley Oliver (Brother/ POA)  [id number]           Emotional Assessment Appearance:: Appears older than stated age Attitude/Demeanor/Rapport: Engaged Affect (typically observed): Accepting Orientation: : Oriented to Self, Oriented to Place, Oriented to  Time,  Oriented to Situation Alcohol / Substance Use: Not Applicable Psych Involvement: No (comment)  Admission diagnosis:  Cord compression (HCC) [G95.20] Pleural effusion [J90] 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] Dyspnea, unspecified type  [R06.00] Pathological fracture of vertebra due to neoplastic disease, initial encounter [X18.55MZ] 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 [place] [address] [place] [address] Phone: [phone] Fax: [phone]     Social Determinants of Health (SDOH) Interventions    Readmission Risk Interventions No flowsheet data found.

## 2021-04-09 NOTE — Progress Notes (Signed)
Report called and given to Benton at [place].

## 2021-04-09 NOTE — Progress Notes (Addendum)
Mobility Specialist Progress Note   04/09/21 1658  Mobility  Activity Ambulated in hall;Ambulated in room  Level of Assistance Maximum assist, patient does 25-49% (+2 Chair follow)  Assistive Device Front wheel walker  Distance Ambulated (ft) 36 ft  Mobility Ambulated with assistance in room;Ambulated with assistance in hallway  Mobility Response Tolerated well  Mobility performed by Mobility specialist  $Mobility charge 1 Mobility   Received pt in bed having no initial complaints and agreeable to mobility. Bed mobility and STS is contact guard but ambulation is MaxA. x2 LOB d/t unsteadiness and lack of control in LE. x2 seated break d/t fatigue, returned back to bed via chair follow. Left w/ all needs met and call bell by side.  Holland Falling Mobility Specialist Phone Number [phone]

## 2021-04-10 ENCOUNTER — Other Ambulatory Visit (HOSPITAL_COMMUNITY): Payer: Self-pay

## 2021-04-12 ENCOUNTER — Encounter: Payer: Self-pay | Admitting: Adult Health

## 2021-04-12 ENCOUNTER — Non-Acute Institutional Stay (SKILLED_NURSING_FACILITY): Payer: Medicare PPO | Admitting: Adult Health

## 2021-04-12 ENCOUNTER — Telehealth: Payer: Self-pay | Admitting: *Deleted

## 2021-04-12 DIAGNOSIS — G9520 Unspecified cord compression: Secondary | ICD-10-CM

## 2021-04-12 DIAGNOSIS — C099 Malignant neoplasm of tonsil, unspecified: Secondary | ICD-10-CM | POA: Diagnosis not present

## 2021-04-12 DIAGNOSIS — J90 Pleural effusion, not elsewhere classified: Secondary | ICD-10-CM

## 2021-04-12 DIAGNOSIS — R1312 Dysphagia, oropharyngeal phase: Secondary | ICD-10-CM | POA: Diagnosis not present

## 2021-04-12 DIAGNOSIS — E039 Hypothyroidism, unspecified: Secondary | ICD-10-CM

## 2021-04-12 NOTE — Telephone Encounter (Addendum)
Per Dr. Gearldine Shown request: placed referral order for F/U with Dr. Tammi Klippel in next 2 weeks-needs radiation. Spoke w/patient to determine if he wishes to have his medical oncology care locally or return to Dr. Reece Levy at [facility]. He does wish to see Dr. [name] to get his input on his past treatment and potential treatment and prognosis and will then decide on where to have continued treatment. LOS sent for OV w/Dr. [name] in ~ 1 month for 45 minutes

## 2021-04-12 NOTE — Progress Notes (Signed)
Location:  [place] Room Number: [place] of Service:  SNF (31) Provider:  Durenda Age, DNP, FNP-BC  Patient Care Team: Patient, [name] as PCP - General (General Practice)  Extended Emergency Contact Information Primary Emergency Contact: Nathanyl, [place] Mobile Phone: [phone] Relation: Brother Secondary Emergency Contact: [place] Phone: [phone] Relation: Mother  Code Status:  DNR  Goals of care: Advanced Directive information Advanced Directives 04/12/2021  Does Patient Have a Medical Advance Directive? Yes  Type of Paramedic of [place];Living will  Does patient want to make changes to medical advance directive? -  Copy of [place] in Chart? Yes - validated most recent copy scanned in chart (See row information)     Chief Complaint  Patient presents with   Hospitalization [facility] follow up    HPI:  Pt is a 65 y.o. male who was admitted to [facility] [place] on 04/09/21 post hospital admission 04/01/21 to 04/09/21. 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 He went to [facility]  ED on 04/01/21 S/P fall at home due to weakness/numbness of lower extremities.  D-dimer was elevated. CTA chest was negative for PE but showed medial left lung mass extending to mediastinum.  MRI lumbar spine showed 8 mm enhancing lesion within the inferior aspect of L5 vertebral body suspicious for metastatic lesion.  MRI of thoracic spine showed large destructive and infiltrative metastatic deposit centered at the left paraspinal region with involvement of T4-T7 vertebral bodies.  Associated epidural extension with severe spinal stenosis, cord compression and cord signal changes at the level of T4-5 through T6-7.   Metastatic lesion extends to involve the adjacent posterior medial aspect of the left lung as well as the left adjacent mediastinum and left hilar/perihilar region.  Associated pathologic fracture of T5, 1 cm enhancing lesion within the T11 vertebral body suspicious for metastatic lesion.  Neurosurgery was consulted and was transferred to [facility] wherein he had decompression T4-T6 with T5 transverse decompression, T3-T7 pedicle screw instrumentation on 04/03/21. He was started on Dexamethasone 4 mg TID on 04/09/21 then tapered to Dexamethasone 4 mg BID on 11/19 to 11/20. Dexamethasone was further tapered to 2 mg PO BID on 04/12/21.  He was seen in his room today. He talked about being a [place] and talked about going through a lot of treatments. He stated that he developed some mental strategies to help him gain strength.   Past Medical History:  Diagnosis Date   Blood in stool    Cancer ([place]) 08/05/14   right tonsil, P16 positive   Past Surgical History:  Procedure Laterality Date   EYE SURGERY     KNEE SURGERY     LUMBAR PERCUTANEOUS PEDICLE SCREW 4 LEVEL N/A 04/02/2021   Procedure: THORACIC THREE-THORACIC SEVEN, INSTRUMENTATION AND FUSION, THORACIC FOUR-THORACIC SIX LAMINECTOMY, THORACIC FIVE TRANSPEDICULAR DECOMPRESSION;  Surgeon: Dawley, Theodoro Doing, DO;  Location: [facility];  Service: Neurosurgery;  Laterality: N/A;   tonsil biopsy Right 08/05/14   invasive squamous cell carcinoma    No Known Allergies  Outpatient Encounter Medications as of 04/12/2021  Medication Sig   bisacodyl (DULCOLAX) 10 MG suppository Place 10 mg rectally as needed for moderate constipation.   dexamethasone (DECADRON) 2 MG tablet Take 2 mg by mouth 2 (two) times daily.   docusate (COLACE)  50 MG/5ML liquid Take 10 mLs (100 mg total) by mouth 2 (two) times daily.   [EXPIRED] HYDROcodone-acetaminophen (NORCO) 10-325 MG tablet Take 1 tablet by mouth every 6 (six) hours as needed for up to 3 days for  moderate pain ((score 4 to 6)).   levothyroxine (SYNTHROID) 50 MCG tablet Take 50 mcg by mouth daily.   lidocaine (LIDODERM) 5 % Place 1 patch onto the skin daily as needed (pain).   Magnesium Hydroxide (MILK OF MAGNESIA PO) Take 30 mLs by mouth as needed.   [EXPIRED] methocarbamol (ROBAXIN) 500 MG tablet Take 1 tablet (500 mg total) by mouth every 6 (six) hours as needed for up to 3 days for muscle spasms.   Multiple Vitamins-Minerals (MULTIVITAMIN ADULT) TABS Take 1 tablet by mouth daily. with Omega 3   polyethylene glycol (MIRALAX / GLYCOLAX) 17 g packet Take 17 g by mouth daily as needed for mild constipation.   Sodium Phosphates (RA SALINE ENEMA RE) Place 1 Dose rectally as needed.   [DISCONTINUED] dexamethasone (DECADRON) 2 MG tablet 04/09/2021 - dexamethasone 4mg  PO TID 11/19-11/20/2022  - dexamethasone 4mg  PO BID,  04/12/2021 -continue dexamethasone 2mg  PO BID, continue until neurosurgery follow-up. (Patient taking differently: 2 mg 2 (two) times daily. 04/12/2021 -continue dexamethasone 2mg  PO BID, continue until neurosurgery follow-up.)   [DISCONTINUED] UNABLE TO FIND Take 1 tablet by mouth 2 (two) times daily as needed (for severe pain). CBD gummies Highest Strength   No facility-administered encounter medications on file as of 04/12/2021.    Review of Systems  GENERAL: No fever or chills  MOUTH and THROAT: Denies oral discomfort, gingival pain or bleeding RESPIRATORY: no cough, SOB, DOE, wheezing, hemoptysis CARDIAC: No chest pain, edema or palpitations GI: No abdominal pain, diarrhea, constipation, heart burn, nausea or vomiting GU: Denies dysuria, frequency, hematuria or discharge NEUROLOGICAL: Denies dizziness or headache PSYCHIATRIC: Denies feelings of depression or anxiety. No report of hallucinations, insomnia, paranoia, or agitation   Immunization History  Administered Date(s) Administered   Janssen (J&J) SARS-COV-2 Vaccination 08/31/2019   PPD Test 09/14/2017    Pertinent  Health Maintenance Due  Topic Date Due   COLONOSCOPY (Pts 45-65yrs Insurance coverage will need to be confirmed)  Never done   INFLUENZA VACCINE  Never done   Fall Risk 04/07/2021 04/07/2021 04/08/2021 04/08/2021 04/09/2021  Patient Fall Risk Level High fall risk High fall risk High fall risk High fall risk High fall risk     Vitals:   04/12/21 0935  BP: 134/90  Pulse: 93  Resp: 18  Temp: (!) 97.3 F (36.3 C)  Height: 5\' 10"  (1.778 m)   Body mass index is 15.82 kg/m.  Physical Exam  GENERAL APPEARANCE:  In no acute distress.  SKIN:  Has surgical wound on upper back with multiple staples intact, no redness, covered with dry dressing.  MOUTH and THROAT: Lips are without lesions. Oral mucosa is moist and without lesions.  RESPIRATORY: Breathing is even & unlabored, BS CTAB CARDIAC: RRR, no murmur,no extra heart sounds, no edema GI: Abdomen soft, normal BS, no masses, no tenderness NEUROLOGICAL: There is no tremor. Speech is clear. Alert and oriented X 3. PSYCHIATRIC:  Affect and behavior are appropriate  Labs reviewed: Recent Labs    09/11/20 0000 04/01/21 1912 04/02/21 2341 04/03/21 0225 04/04/21 0340  NA 141 137 136  --  133*  K 4.7 3.4* 4.2  --  3.8  CL 105 101  --   --  101  CO2 25 27  --   --  25  GLUCOSE 93 97  --   --  148*  BUN 19 20  --   --  17  CREATININE 0.83 0.72  --  0.92 0.69  CALCIUM 9.3 9.3  --   --  8.5*   Recent Labs    09/11/20 0000 04/01/21 1912 04/04/21 0340  AST 17 27 31   ALT 6* 11 11  ALKPHOS  --  82 58  BILITOT 0.6 1.2 0.8  PROT 6.1 7.2 5.2*  ALBUMIN  --  4.2 3.0*   Recent Labs    04/01/21 1912 04/02/21 2341 04/03/21 0225 04/04/21 0340  WBC 6.6  --  10.6* 11.7*  NEUTROABS 5.4  --   --   --   HGB 14.0 9.2* 12.8* 12.2*  HCT 40.9 27.0* 37.1* 35.8*  MCV 88.1  --  86.7 85.6  PLT 274  --  205 224   Lab Results  Component Value Date   TSH 1.47 09/11/2020   No results found for: HGBA1C Lab Results  Component  Value Date   CHOL 164 09/11/2020   HDL 48 09/11/2020   LDLCALC 95 09/11/2020   TRIG 118 09/11/2020   CHOLHDL 3.4 09/11/2020    Significant Diagnostic Results in last 30 days:  CT Angio Chest PE W and/or Wo Contrast  Result Date: 04/02/2021 CLINICAL DATA:  Weakness, T4-6 tumor, history of right tonsillar cancer, elevated D-dimer, evaluate for PE EXAM: CT ANGIOGRAPHY CHEST WITH CONTRAST TECHNIQUE: Multidetector CT imaging of the chest was performed using the standard protocol during bolus administration of intravenous contrast. Multiplanar CT image reconstructions and MIPs were obtained to evaluate the vascular anatomy. CONTRAST:  67mL OMNIPAQUE IOHEXOL 350 MG/ML SOLN COMPARISON:  None. FINDINGS: Cardiovascular: Satisfactory opacification of the bilateral pulmonary arteries to the lobar level. Narrowing of the left main pulmonary artery due to tumor. No evidence of pulmonary embolism. Although not tailored for evaluation of the thoracic aorta, there is no evidence thoracic aortic aneurysm. Mild atherosclerotic calcifications. The heart is normal in size.  No pericardial effusion. Mediastinum/Nodes: Tumor along the anterior mediastinum/AP window and extending to the medial lung. This will be described below. Visualized thyroid is unremarkable. Lungs/Pleura: Mass involving the medial aspect of the left upper lobe and superior segment left lower lobe, extending to the mediastinum, measuring 14.1 x 4.7 cm (series 6/image 93). Moderate left pleural effusion, partially loculated, with associated apical capping. Associated left lower lobe opacity, likely compressive atelectasis. Trace right pleural effusion. 9 mm nodule in the superior segment right lower lobe (series 7/image 36). No pneumothorax. Upper Abdomen: Visualized upper abdomen is grossly unremarkable. Musculoskeletal: Moderate compression fracture at T5, pathologic. Total spine MRI has been performed and further evaluation will be deferred to that  study. Otherwise, no focal osseous lesions. Review of the MIP images confirms the above findings. IMPRESSION: No evidence of pulmonary embolism. 14.1 x 4.7 cm mass involving the medial left lung and extending to the mediastinum, as above. Moderate left pleural effusion, partially loculated, with apical capping. Trace right pleural effusion. Moderate pathologic compression fracture at T5. Additional spinal involvement will be described on dedicated total spine MR, reported separately. Aortic Atherosclerosis (ICD10-I70.0). Electronically Signed   By: Julian Hy M.D.   On: 04/02/2021 01:31   MR Cervical Spine W or Wo Contrast  Result Date: 04/02/2021 CLINICAL DATA:  Initial evaluation for metastatic disease evaluation. EXAM: MRI CERVICAL SPINE WITHOUT AND WITH CONTRAST TECHNIQUE: Multiplanar and multiecho pulse sequences of the cervical spine, to include the craniocervical junction  and cervicothoracic junction, were obtained without and with intravenous contrast. CONTRAST:  5.10mL GADAVIST GADOBUTROL 1 MMOL/ML IV SOLN COMPARISON:  None. FINDINGS: Alignment: Physiologic with preservation of the normal cervical lordosis. No listhesis. Vertebrae: Vertebral body height maintained without acute or chronic fracture. Bone marrow signal intensity within normal limits. No discrete osseous lesions or evidence for metastatic disease. No abnormal edema or enhancement. Cord: Normal signal morphology. Posterior Fossa, vertebral arteries, paraspinal tissues: Visualized brain and posterior fossa within normal limits. Craniocervical junction normal. Paraspinous soft tissues within normal limits. Normal flow voids seen within the vertebral arteries bilaterally. Disc levels: C2-C3: Right paracentral disc protrusion indents the right ventral thecal sac. No significant spinal stenosis or cord impingement. Foramina remain patent. C3-C4: Right paracentral disc protrusion indents the right ventral thecal sac. No significant spinal  stenosis or cord impingement. Foramina remain patent. C4-C5: Mild degenerative intervertebral disc space narrowing. Superimposed right paracentral disc protrusion indents the ventral thecal sac, contacting and minimally flattening the ventral cord. Thecal sac remains widely patent without significant spinal stenosis. Bilateral uncovertebral spurring with resultant mild right C5 foraminal stenosis. Left neural foramen remains patent. C5-C6: Mild degenerative intervertebral disc space narrowing. Right eccentric disc bulge with associated right greater than left uncovertebral spurring. Broad posterior disc osteophyte mildly flattens the ventral thecal sac without significant spinal stenosis. Moderate right C6 foraminal narrowing. Left neural foramina remains patent. C6-C7: Mild degenerative intervertebral disc space narrowing. Right eccentric disc osteophyte complex mildly flattens the ventral thecal sac without significant spinal stenosis. Mild to moderate right C7 foraminal narrowing. Left neural foramen remains patent. C7-T1: Minimal disc bulge. Mild facet hypertrophy. No spinal stenosis. Foramina remain patent. IMPRESSION: 1. No evidence for metastatic disease within the cervical spine. 2. Small right paracentral disc protrusions at C2-3 through C4-5 without significant spinal stenosis. 3. Right eccentric disc osteophyte complexes at C5-6 and C6-7 with resultant mild to moderate right C6 and C7 foraminal stenosis as above. Electronically Signed   By: Jeannine Boga M.D.   On: 04/02/2021 01:05   MR THORACIC SPINE W WO CONTRAST  Result Date: 04/02/2021 CLINICAL DATA:  Initial metastatic evaluation. EXAM: MRI THORACIC WITHOUT AND WITH CONTRAST TECHNIQUE: Multiplanar and multiecho pulse sequences of the thoracic spine were obtained without and with intravenous contrast. CONTRAST:  5.74mL GADAVIST GADOBUTROL 1 MMOL/ML IV SOLN COMPARISON:  None available. FINDINGS: Alignment: Dextroscoliosis. Focal kyphotic  angulation of the thoracic spine at the level of T5. No listhesis. Vertebrae: Large contiguous infiltrative and destructive metastatic deposit seen involving the upper-midthoracic spine. Lesion involves the T4 through T7 vertebral bodies, and is centered at the left paraspinous region. Although measurements are somewhat difficult, lesion measures approximately 7.7 x 8.4 x 10.9 cm in greatest dimensions (AP by transverse by craniocaudad). Lesion involves the left hilar/perihilar region, tracking posteriorly along the posteromedial left lung, involving the adjacent left mediastinum and partially encasing the aorta. Direct extension of this mass into the adjacent posterior ribs and T4 through T7 vertebral bodies, with involvement of their posterior elements at several levels. There is an associated pathologic fracture of T5 with approximately 60% height loss. Epidural extension of tumor to involve the spinal canal, extending from T3 through T7. This is most pronounced at the level of T5 where there is circumferential epidural involvement. Associated severe spinal stenosis at the level of T5 with cord compression and associated cord signal changes (series 17, image 8). Thecal sac measures 4 mm in AP diameter at its most narrow point. Tumor also extends to involve the  left neural foramina at T4-5 through T6-7, with probable early extension into the left T3-4 and T7-8 foramina. Involvement of the right T4-5 and T6-7 neural foramina noted as well. There is an additional 1 cm T1 hypointense enhancing lesion within the T11 vertebral body, indeterminate, but could reflect an additional metastatic lesion (series 19, image 7). Otherwise, no other metastatic disease seen within the thoracic spine. Multiple scattered benign hemangiomata noted. No other acute or chronic fracture. Cord: Epidural tumor with associated severe spinal stenosis and cord signal changes at T4-5 through T6-7 as above, most pronounced at the T5 level.  Otherwise, signal intensity within the thoracic cord is within normal limits. No other epidural involvement. Paraspinal and other soft tissues: Metastatic lesion involving the left paraspinous and posteromedial left lung at T4 through T7 as above. Associated atelectasis with irregular and partially loculated left pleural effusion partially visualized. Direct extension of tumor to involve the left posterior soft tissues of the left mid back noted as well. Disc levels: No significant underlying disc pathology for age. No other significant stenosis or impingement. IMPRESSION: 1. Large destructive and infiltrative metastatic deposit centered at the left paraspinous region, with involvement of the T4 through T7 vertebral bodies as above. Associated epidural extension with severe spinal stenosis, cord compression, and cord signal changes at the levels of T4-5 through T6-7. 2. Metastatic lesion extends to involve the adjacent posteromedial aspect of the left lung as well as the left adjacent mediastinum and left hilar/perihilar region, also seen on corresponding CTA of the chest. 3. Associated pathologic fracture of T5 with up to 60% height loss. 4. 1 cm enhancing lesion within the T11 vertebral body, indeterminate, but could reflect an additional metastatic lesion. Attention at follow-up recommended. Critical Value/emergent results were called by telephone at the time of interpretation on 04/02/2021 at 1:32 am to provider Dr. Florina Ou , who verbally acknowledged these results. Electronically Signed   By: Jeannine Boga M.D.   On: 04/02/2021 01:41   MR Lumbar Spine W Wo Contrast  Result Date: 04/02/2021 CLINICAL DATA:  Initial evaluation for metastatic evaluation. EXAM: MRI LUMBAR SPINE WITHOUT AND WITH CONTRAST TECHNIQUE: Multiplanar and multiecho pulse sequences of the lumbar spine were obtained without and with intravenous contrast. CONTRAST:  5.62mL GADAVIST GADOBUTROL 1 MMOL/ML IV SOLN COMPARISON:  None  available. FINDINGS: Segmentation: Standard. Lowest well-formed disc space labeled the L5-S1 level. Alignment: Physiologic with preservation of the normal lumbar lordosis. No listhesis. Vertebrae: Vertebral body height maintained without acute or chronic fracture. Prominent benign hemangioma noted within the T12 vertebral body. There is a 8 mm T1 hypointense enhancing lesion within the inferior aspect of the L5 vertebral body, nonspecific, but could reflect a small metastatic lesion (series 23, image 6). No other evidence for metastatic disease seen within the lumbar spine. Conus medullaris and cauda equina: Conus extends to the L1 level. Conus and cauda equina appear normal. Paraspinal and other soft tissues: Mild diffuse edema within the lower posterior paraspinous musculature, nonspecific, but could reflect muscular injury and/or strain. Soft tissues demonstrate no other acute finding. Prominent distension of the partially visualized urinary bladder. Visualized visceral structures otherwise unremarkable. Disc levels: L1-2:  Negative interspace.  Mild facet hypertrophy.  No stenosis. L2-3:  Negative interspace.  Mild facet hypertrophy.  No stenosis. L3-4:  Negative interspace.  Mild facet hypertrophy.  No stenosis. L4-5: Disc desiccation with minimal disc bulge. Superimposed tiny central disc protrusion with annular fissure. Mild facet hypertrophy. Mild narrowing of the lateral recesses. Central canal remains  patent. No foraminal stenosis. L5-S1: Degenerative intervertebral disc space narrowing with disc desiccation and mild diffuse disc bulge. Mild reactive endplate spurring. Mild facet hypertrophy. No significant spinal stenosis. Foramina remain patent. IMPRESSION: 1. 8 mm enhancing lesion within the inferior aspect of the L5 vertebral body, nonspecific, but could reflect a small metastatic lesion. Attention at follow-up recommended. 2. No other evidence for metastatic disease within the lumbar spine. 3. Mild  diffuse edema within the lower posterior paraspinous musculature, nonspecific, but could reflect muscular injury and/or strain. 4. Prominent distension of the visualized urinary bladder. Query urinary retention. 5. Mild for age spondylosis without significant stenosis. Electronically Signed   By: Jeannine Boga M.D.   On: 04/02/2021 01:51   [place] CHEST (PLEURAL EFFUSION)  Result Date: 04/02/2021 CLINICAL DATA:  Pleural effusion. EXAM: CHEST ULTRASOUND COMPARISON:  CTA Chest earlier same day FINDINGS: Small left pleural effusion.  No discernible right effusion. IMPRESSION: Small left pleural effusion. Inadequate volume for safe image guided thoracentesis. Electronically Signed   By: Misty Stanley M.D.   On: 04/02/2021 12:23   DG Thoracic Spine 1 View  Result Date: 04/03/2021 CLINICAL DATA:  T3-7 fusion EXAM: OPERATIVE THORACIC SPINE 1 VIEW(S) COMPARISON:  None. FLUOROSCOPY TIME:  36 seconds FINDINGS: Intraoperative fluoroscopic image during T3-7 fusion with pedicle screws. IMPRESSION: Intraoperative fluoroscopic images, as above. Electronically Signed   By: Julian Hy M.D.   On: 04/03/2021 02:56   DG Chest Portable 1 View  Result Date: 04/01/2021 CLINICAL DATA:  Chest tightness, tumor at T4-6 EXAM: PORTABLE CHEST 1 VIEW COMPARISON:  11/09/2017 FINDINGS: Left perihilar/mediastinal mass, progressive. Associated apical pleural thickening versus fluid, new. Right lung is clear.  No pneumothorax. The heart is normal in size. IMPRESSION: Left perihilar/mediastinal mass in this patient with known T4-6 tumor, progressive. Associated apical pleural thickening/versus fluid, new. Electronically Signed   By: Julian Hy M.D.   On: 04/01/2021 20:24   DG C-Arm 1-60 Min-No Report  Result Date: 04/03/2021 CLINICAL DATA:  T3-7 fusion EXAM: OPERATIVE THORACIC SPINE 1 VIEW(S) COMPARISON:  None. FLUOROSCOPY TIME:  36 seconds FINDINGS: Intraoperative fluoroscopic image during T3-7 fusion with pedicle  screws. IMPRESSION: Intraoperative fluoroscopic images, as above. Electronically Signed   By: Julian Hy M.D.   On: 04/03/2021 02:56   DG C-Arm 1-60 Min-No Report  Result Date: 04/03/2021 CLINICAL DATA:  T3-7 fusion EXAM: OPERATIVE THORACIC SPINE 1 VIEW(S) COMPARISON:  None. FLUOROSCOPY TIME:  36 seconds FINDINGS: Intraoperative fluoroscopic image during T3-7 fusion with pedicle screws. IMPRESSION: Intraoperative fluoroscopic images, as above. Electronically Signed   By: Julian Hy M.D.   On: 04/03/2021 02:56   VAS [place] LOWER EXTREMITY VENOUS (DVT)  Result Date: 04/02/2021  Lower Venous DVT Study Patient Name:  SESAR MADEWELL. [facility]  Date of Exam:   04/02/2021 Medical Rec #: [id number]          Accession #:    [id number] Date of Birth: 02-02-1956          Patient Gender: M Patient Age:   31 years Exam Location:  [facility] Procedure:      VAS [place] LOWER EXTREMITY VENOUS (DVT) Referring Phys: Wandra Feinstein RATHORE --------------------------------------------------------------------------------  Indications: Elevated Ddimer.  Risk Factors: Cancer. Limitations: Patient positioning and body habitus. Comparison Study: No prior studies. Performing Technologist: Oliver Hum RVT  Examination Guidelines: A complete evaluation includes B-mode imaging, spectral Doppler, color Doppler, and power Doppler as needed of all accessible portions of each vessel. Bilateral testing is considered an integral part of a  complete examination. Limited examinations for reoccurring indications may be performed as noted. The reflux portion of the exam is performed with the patient in reverse Trendelenburg.  +---------+---------------+---------+-----------+----------+--------------+ RIGHT    CompressibilityPhasicitySpontaneityPropertiesThrombus Aging +---------+---------------+---------+-----------+----------+--------------+ CFV      Full           Yes      Yes                                  +---------+---------------+---------+-----------+----------+--------------+ SFJ      Full                                                        +---------+---------------+---------+-----------+----------+--------------+ FV Prox  Full                                                        +---------+---------------+---------+-----------+----------+--------------+ FV Mid   Full                                                        +---------+---------------+---------+-----------+----------+--------------+ FV DistalFull                                                        +---------+---------------+---------+-----------+----------+--------------+ PFV      Full                                                        +---------+---------------+---------+-----------+----------+--------------+ POP      Full           Yes      Yes                                 +---------+---------------+---------+-----------+----------+--------------+ PTV      Full                                                        +---------+---------------+---------+-----------+----------+--------------+ PERO     Full                                                        +---------+---------------+---------+-----------+----------+--------------+   +---------+---------------+---------+-----------+----------+--------------+ LEFT     CompressibilityPhasicitySpontaneityPropertiesThrombus Aging +---------+---------------+---------+-----------+----------+--------------+ CFV      Full  Yes      Yes                                 +---------+---------------+---------+-----------+----------+--------------+ SFJ      Full                                                        +---------+---------------+---------+-----------+----------+--------------+ FV Prox  Full                                                         +---------+---------------+---------+-----------+----------+--------------+ FV Mid   Full                                                        +---------+---------------+---------+-----------+----------+--------------+ FV DistalFull                                                        +---------+---------------+---------+-----------+----------+--------------+ PFV      Full                                                        +---------+---------------+---------+-----------+----------+--------------+ POP      Full           Yes      Yes                                 +---------+---------------+---------+-----------+----------+--------------+ PTV      Full                                                        +---------+---------------+---------+-----------+----------+--------------+ PERO     Full                                                        +---------+---------------+---------+-----------+----------+--------------+     Summary: RIGHT: - There is no evidence of deep vein thrombosis in the lower extremity.  - No cystic structure found in the popliteal fossa.  LEFT: - There is no evidence of deep vein thrombosis in the lower extremity.  - No cystic structure found in the popliteal fossa.  *See table(s) above for measurements and observations. Electronically signed by Servando Snare MD on 04/02/2021 at 4:26:00  PM.    Final     Assessment/Plan  1. Cord compression ([place]) -  S/P decompression T4-T6 with T5 transverse decompression, T3-T7 pedicle screw instrumentation on 04/03/21. He was started on Dexamethasone 4 mg TID on 04/09/21 then tapered to Dexamethasone 4 mg BID on 11/19 to 11/20. Dexamethasone was further tapered to 2 mg PO BID on 04/12/21. -    Continue Norco 10-325 mg 1 tab every 6 hours PRN X 3 days in lidocaine 5% patch apply transdermally daily as needed for pain and methocarbamol 500 mg 1 tab every 6 hours PRN x3 days for muscle spasm -   currently having PT and OT, for therapeutic strengthening exercises  2. 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 Oropharyngeal dysphagia -   thought to be due to cancer -  speech therapy working on safe swallowing techniques -  aspiration precautions  4. Pleural effusion Thoracentesis attempted by IR on 06/02/20 but there was no fluid collection for safe approach found  5. Acquired hypothyroidism Lab Results  Component Value Date   TSH 1.47 09/11/2020   Continue Synthroid 50 mcg 1 tab daily      Family/ staff Communication:   Discussed plan of care with resident and charge nurse.  Labs/tests ordered:  CBC and BMP  Goals of care:   Short-term care   Durenda Age, DNP, MSN, FNP-BC [facility] [phone] (Monday-Friday 8:00 a.m. - 5:00 p.m.) [phone] (after hours)

## 2021-04-13 ENCOUNTER — Non-Acute Institutional Stay (SKILLED_NURSING_FACILITY): Payer: Medicare PPO | Admitting: Internal Medicine

## 2021-04-13 ENCOUNTER — Encounter: Payer: Self-pay | Admitting: Internal Medicine

## 2021-04-13 DIAGNOSIS — C099 Malignant neoplasm of tonsil, unspecified: Secondary | ICD-10-CM | POA: Diagnosis not present

## 2021-04-13 DIAGNOSIS — G9520 Unspecified cord compression: Secondary | ICD-10-CM | POA: Diagnosis not present

## 2021-04-13 DIAGNOSIS — E43 Unspecified severe protein-calorie malnutrition: Secondary | ICD-10-CM

## 2021-04-13 DIAGNOSIS — R7989 Other specified abnormal findings of blood chemistry: Secondary | ICD-10-CM | POA: Diagnosis not present

## 2021-04-13 NOTE — Assessment & Plan Note (Signed)
NS F/U to remove sutures & reassess Decadron dosage & duration

## 2021-04-13 NOTE — Assessment & Plan Note (Signed)
Current total protein 5.2 and albumin 3.  Exam reveals diffuse muscle wasting of limbs and interosseous wasting.  Nutrition consult at the SNF.

## 2021-04-13 NOTE — Assessment & Plan Note (Signed)
Oncology F/U as scheduled

## 2021-04-13 NOTE — Assessment & Plan Note (Signed)
Clinically no evidence of DVT.  Chest symptoms of hypersensitivity to touch appear to be neuropathic not suggestive of PTE.Marland Kitchen

## 2021-04-13 NOTE — Progress Notes (Signed)
NURSING HOME LOCATION:  [facility] ROOM NUMBER:  [id number]  CODE STATUS:  DNR  PCP:  none listed  This is a comprehensive admission note to this SNFperformed on this date less than 30 days from date of admission. Included are preadmission medical/surgical history; reconciled medication list; family history; social history and comprehensive review of systems.  Corrections and additions to the records were documented. Comprehensive physical exam was also performed. Additionally a clinical summary was entered for each active diagnosis pertinent to this admission in the Problem List to enhance continuity of care.  HPI: He was hospitalized 11/10 - 04/09/2021 presenting to the ED after fall at home in the context of weakness and bilateral lower extremity numbness.  D-dimer was elevated; but CTA of the chest was negative for PTE.  It did reveal a left lung mass extending to the mediastinum.  Loculated effusion was documented; IR could not safely perform thoracentesis.  Venous duplex of the lower extremities was negative for DVT.  The elevated D-dimer was attributed to the underlying malignancy. MRI of the lumbar spine revealed an 8 mm enhancing lesion within the inferior aspect of L5 vertebral body suggestive of metastatic disease.  MRI of the thoracic spine revealed a large destructive and infiltrative metastatic deposit centered at the left paraspinous region with involvement of T4-T7 vertebral bodies.  There was an associated epidural extension with severe spinal stenosis, cord compression, and cord signal changes at the level of T4-5 through T6-7.  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  The metastatic lesion extension involved the adjacent posterior medial aspect of the left lung as well as the left adjacent mediastinum and left  hilar/perihilar region.  There is associated pathologic fracture of T5.  1 cm enhancing lesion was present within the T11 vertebral body also suggestive of metastatic disease.   Neurosurgery performed decompression T4-T6 with T5 transverse decompression and T3-T7 pedicle screw instrumentation.  Decadron was prescribed with a tapering course with initial dose of 4 mg 3 times daily.  As of 11/21 he was to be weaned to 2 mg twice daily which was to be continued until reevaluation by NS. He was seen by Speech Therapy for chronic dysphagia attributed to his prior tonsillar cancer. Postop there was gradual improvement in physical strength and coordination.  PT recommended discharge to SNF for rehab.  Past medical and surgical history: Tonsillar cancer was diagnosed in 2016.  He received chemotherapy @ [facility], most recently in 2019.   Surgeries and procedures include eye surgery, knee surgery, and tonsil biopsy in 2016.  Social history: Not current drinker; he smoked for short time as a teenager.  Family history: There is a strong family history of diabetes.   Review of systems: His main focus was on nutrition.  He wants to talk with a nutritionist to optimize dietary interventions.  He does describe dysphagia with stringy foods.  He is disappointed with his food choices here at the SNF.  He does use [employer] and low-sodium soy "to make the food more palatable". He describes his legs as being "floppy" with neuropathic symptoms involving mainly the soles.  He states that they feel contracted with decreased sensation. He states his condition has been associated with decreased movement of the abdominal muscles with associated constipation.  He states the Decadron has been of some benefit. He describes hypersensitivity  to touch over the right lateral chest. He does describe urinary urgency without incontinence or nocturia.  Constitutional: No fever  Eyes: No redness, discharge, pain, vision  change ENT/mouth: No nasal congestion, purulent discharge, earache, change in hearing, sore throat  Cardiovascular: No  palpitations, paroxysmal nocturnal dyspnea, claudication, edema  Respiratory: No cough, sputum production, hemoptysis,  significant snoring, apnea  Gastrointestinal: No heartburn, abdominal pain, nausea /vomiting, rectal bleeding, melena Genitourinary: No dysuria, hematuria, pyuria Dermatologic: No rash, pruritus, change in appearance of skin Neurologic: No dizziness, headache, syncope, seizures Psychiatric: No significant  depression, insomnia Endocrine: No change in hair/skin/nails, excessive thirst, excessive hunger, excessive urination  Hematologic/lymphatic: No significant bruising, lymphadenopathy, abnormal bleeding Allergy/immunology: No itchy/watery eyes, significant sneezing, urticaria, angioedema  Physical exam:  Pertinent or positive findings: He appears cachectic and chronically ill.  He exhibits a halting speech pattern where he seems to almost forcefully expel the words, almost shouting.  Hair is disheveled .  Eyebrows are essentially absent.  He is Geneticist, molecular.  He does have a goatee. The posterior oropharynx could not be visualized.  First heart sound is accentuated.  He has a raspy rales in a scattered distribution.  Pedal pulses are decreased. Limbs are thin but he is strong to opposition in all extremities.  Interosseous wasting is also noted.  General appearance:  no acute distress, increased work of breathing is present.   Lymphatic: No lymphadenopathy about the head, neck, axilla. Eyes: No conjunctival inflammation or lid edema is present. There is no scleral icterus. Ears:  External ear exam shows no significant lesions or deformities.   Nose:  External nasal examination shows no deformity or inflammation. Nasal mucosa are pink and moist without lesions, exudates Neck:  No thyromegaly, masses, tenderness noted.    Heart:  Normal rate and regular rhythm.   S2 normal without gallop, murmur, click, rub.  Lungs:  without wheezes, rhonchi, rubs. Abdomen: Bowel sounds are normal.  Abdomen is soft and nontender with no organomegaly, hernias, masses. GU: Deferred  Extremities:  No cyanosis, clubbing, edema. Neurologic exam:  Balance, Rhomberg, finger to nose testing could not be completed due to clinical state Skin: Warm & dry w/o tenting. No significant lesions or rash.  See clinical summary under each active problem in the Problem List with associated updated therapeutic plan

## 2021-04-13 NOTE — Patient Instructions (Signed)
See assessment and plan under each diagnosis in the problem list and acutely for this visit 

## 2021-04-14 ENCOUNTER — Telehealth: Payer: Self-pay | Admitting: *Deleted

## 2021-04-14 LAB — BASIC METABOLIC PANEL
BUN: 49 — AB (ref 4–21)
CO2: 23 — AB (ref 13–22)
Chloride: 102 (ref 99–108)
Creatinine: 0.6 (ref 0.6–1.3)
Glucose: 124
Potassium: 4.4 (ref 3.4–5.3)
Sodium: 135 — AB (ref 137–147)

## 2021-04-14 LAB — CBC AND DIFFERENTIAL
HCT: 37 — AB (ref 41–53)
Hemoglobin: 12.8 — AB (ref 13.5–17.5)
Platelets: 224 (ref 150–399)
WBC: 18.9

## 2021-04-14 LAB — COMPREHENSIVE METABOLIC PANEL: Calcium: 8.3 — AB (ref 8.7–10.7)

## 2021-04-14 LAB — CBC: RBC: 4.29 (ref 3.87–5.11)

## 2021-04-14 NOTE — Telephone Encounter (Signed)
Mr. Saini had communicated to scheduler that a 1 month visit is too long to wait. Feels his appointment is urgent. Explained to patient that his 1st priority is to see radiation oncology and start treatment. Dr. [name] will not begin tx until RT is completed and he has appointment there on 04/20/21. We will follow to see when RT starts and will schedule OV with Dr. [name] about half way through RT for a consult. He reports he wants to go over all his records and scans as well saying "You have a very educated patient and I can read and interpret labs and scans". He adds that instead of a planning CT for RT he wants and MRI. I suggested he discuss this with RT. Also reports his diet choices at [facility] are terrible and the dietician there "is not very helpful. I know more about diet than she does". Encouraged him to ask to see dietician at [facility] while in RT and she may be able to communicate with the SNF about your diet. He requests that he and his brother be notified of his consult appointment w/Dr. [name]. I assured him this will occur.

## 2021-04-16 ENCOUNTER — Non-Acute Institutional Stay (SKILLED_NURSING_FACILITY): Payer: Medicare PPO | Admitting: Internal Medicine

## 2021-04-16 ENCOUNTER — Encounter: Payer: Self-pay | Admitting: Internal Medicine

## 2021-04-16 DIAGNOSIS — D72829 Elevated white blood cell count, unspecified: Secondary | ICD-10-CM | POA: Diagnosis not present

## 2021-04-16 NOTE — Progress Notes (Signed)
NURSING HOME LOCATION:  [facility] ROOM NUMBER:  [id number]  CODE STATUS:  DNR  PCP:  none listed  This is a nursing facility follow up visit for specific acute issue of leukocytosis.  Interim medical record and care since last SNF visit was updated with review of diagnostic studies and change in clinical status since last visit were documented.  HPI: Follow-up labs were performed 04/14/2021 which revealed improvement in his anemia with H/H rising from 12.2/35.8 up to 12.8/37.2.  White count had increased from 11.7 up to 18.9.  Basic metabolic panel revealed normalization of sodium at 135.  Creatinine was 0.64 with a GFR greater than 90.  He did have some prerenal azotemia with a BUN of 49. In reference to the leukocytosis he stated that he had increased frequency of bowel movements over the previous week but this is "dissipating".  He now describes his stools as "like mashed potatoes".  He denies frank diarrhea.  He relates the change in bowels to his food choices here at the SNF.  He states that he is on "clean food which is organic, complex yet simple".  He states that the frequent bowel movements have resulted in some irritation of his hemorrhoids. He contacted Dr. Gearldine Shown office attempting to have an earlier Oncology appointment.  He is scheduled to see radiation therapy beginning 11/29.  Heme/Onc plans follow-up approximately halfway through the radiation therapy treatments.  Review of systems: Other than some altered taste he denies any COVID type symptoms.  He also denies any symptoms of infectious nature.  He has been on a weaning Decadron schedule.  Constitutional: No fever, significant weight change  Eyes: No redness, discharge, pain, vision change ENT/mouth: No nasal congestion,  purulent discharge, earache, change in hearing, sore throat  Cardiovascular: No chest pain, palpitations, paroxysmal nocturnal dyspnea, claudication, edema  Respiratory: No cough,  sputum production, hemoptysis, DOE, significant snoring, apnea   Gastrointestinal: No heartburn, dysphagia, abdominal pain, nausea /vomiting, rectal bleeding, melena Genitourinary: No dysuria, hematuria, pyuria, incontinence, nocturia Musculoskeletal: No joint stiffness, joint swelling, pain Dermatologic: No new rash, pruritus, change in appearance of skin Neurologic: No dizziness, headache, syncope, seizures, numbness, tingling Endocrine: No change in hair/skin/nails, excessive thirst, excessive hunger, excessive urination  Hematologic/lymphatic: No significant bruising, lymphadenopathy, abnormal bleeding Allergy/immunology: No itchy/watery eyes, significant sneezing, urticaria, angioedema  Physical exam:  Pertinent or positive findings: He is extremely agitated about the dietary situation and his nutritional choices.  He is knowledgeable in reference to nutritional interventions.  He appears cachectic and suboptimally nourished.  Hair is disheveled.  Goatee is unkempt.  Eyebrows are essentially absent.  He has minimal low-grade rhonchi diffusely.  Heart sounds are heard best in the lower chest/upper epigastric area.  Posterior tibial pulses are stronger than dorsalis pedis pulses.  Limbs are thin and atrophic.  He has interosseous wasting.  General appearance:no acute distress, increased work of breathing is present.   Lymphatic: No lymphadenopathy about the head, neck, axilla. Eyes: No conjunctival inflammation or lid edema is present. There is no scleral icterus. Ears:  External ear exam shows no significant lesions or deformities.   Nose:  External nasal examination shows no deformity or inflammation. Nasal mucosa are pink and moist without lesions, exudates Oral exam:  There is no oropharyngeal erythema or exudate. Neck:  No thyromegaly, masses, tenderness noted.    Heart:  No gallop, murmur, click, rub .  Lungs: without wheezes, rales, rubs. Abdomen: Bowel sounds are normal. Abdomen is  soft and nontender with no organomegaly, hernias, masses. GU: Deferred  Extremities:  No cyanosis, clubbing, edema  Neurologic exam :Balance, Rhomberg, finger to nose testing could not be completed due to clinical state Skin: Warm & dry w/o tenting. No significant lesions or rash.  See summary under each active problem in the Problem List with associated updated therapeutic plan

## 2021-04-16 NOTE — Patient Instructions (Signed)
See assessment and plan under each diagnosis in the problem list and acutely for this visit 

## 2021-04-16 NOTE — Assessment & Plan Note (Addendum)
White count has risen from 11.7 up to 18.9.  He denies any infectious symptoms.  He has had altered taste and loose stools which are improving.  He has been on a weaning Decadron schedule prescribed by NS. Because of altered taste COVID 19 screening was repeated & it was negative. He has been asked to monitor for any other signs/symptoms of infection.  CBC and differential will be repeated 11/28.  Dr. [name] will be notified of the leukocytosis.

## 2021-04-19 ENCOUNTER — Telehealth: Payer: Self-pay

## 2021-04-19 NOTE — Telephone Encounter (Signed)
Spoke with Bradley Oliver at [facility] to confirm that staff was aware of patient's appointments tomorrow in Radiation Oncology. She confirmed patient would be dropped off at [facility] by 9:30, and would have the number to call for return trip to [facility]

## 2021-04-19 NOTE — Progress Notes (Signed)
Radiation Oncology         [phone]) [phone] ________________________________  Initial Outpatient Consultation  Name: Bradley Oliver MRN: [id number]  Date: 04/20/2021  DOB: 05-Apr-1956  SJ:[id number], [name]  Ladell Pier, MD   REFERRING PHYSICIAN: Ladell Pier, MD  DIAGNOSIS:    ICD-10-CM   1. Metastatic cancer to spine [facility])  C79.51 Ambulatory referral to Social Work    2. 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 male who was first diagnosed with SCC of the right tonsill in March of 2016, for which he presented with an enlarging neck mass. Biopsy was performed in March of 2016 which confirmed HPV+ squamous cell carcinoma, and he underwent definitive chemoradiation at [facility]. Three years later, a  chest CT performed in April/May of 2019 (due to cough and other pulmonary sx) demonstrated a new LUL cavitary lung lesion, as well as mediastinal adenopathy. Flexible bronchoscopy with EBUS and biopsy on 09/28/2017 demonstrated recurrent, metastatic p16+ squamous cell carcinoma. PET imaging performed around this time also showed uptake in LUL cavitary lesion. He was accordingly started on carbo/taxel/pembro for his oligometastatic progression. PET after 3 months of systemic treatment showed an excellent response with minimal measurable disease. He was thus started on pembro maintenance alone. Although there was some concern for progressive disease, he was continued on pembro due to minimal apparent progression. He last received pembro in August of 2020.  However, chest CT on 07/13/2018 did show a new LUL posterior segment lesion measuring 1.6 cm, a new 1.1 cm LUL nodule, and a new 0.5 cm  left apical nodularity. Imaging on 10/05/18 and 01/11/19 confirmed progression.   Subsequently, the patient saw Ace Gins, Hershal Coria [facility]) on 10/22/20 for further evaluation (large unknown interval between 2020 and 2022 due to lack of imaging studies / follow-up). Based on lack of follow-up/ treatment in the 2 year interval, the patient was recommended PET imaging to confirm suspected progression. The patient also voiced interest in HPV directed therapies during this visit.   PET On 10/30/20 revealed a large hypermetabolic posterior mediastinal mass involving the T5 and T6 vertebral bodies and the left posterior fifth and sixth ribs. Additional hypermetabolic soft tissue within the prevascular mediastinum was also appreciated, which appeared contiguous with the dominant posterior mediastinal mass, as well as hypermetabolic spiculated lung nodules within the left upper lobe.   On 04/01/21, the patient presented to the ED and was admitted after a fall at home in the setting of weakness/numbness of bilateral lower extremities. CTA of the chest performed on 04/01/21 revealed a 14.1 x 4.7 cm mass involving the medial left lung and extending to the mediastinum, a moderate left pleural effusion, and a moderate pathologic compression fracture at T5. MRI of the thoracic spine on 11/10 further revealed a large destructive and infiltrative metastatic deposit centered at the left paraspinous region, with involvement of the T4 through T7 vertebral bodies. Associated epidural extension with severe spinal stenosis, cord compression, and cord signal changes at the levels of T4-5 through T6-7 were appreciated as well. The metastatic lesion was noted to extend to involve the adjacent posteromedial aspect of the left lung, as well as the left  adjacent mediastinum and left hilar/perihilar region. An indeterminate 1 cm enhancing lesion within the T11 vertebral body was also appreciated, and noted to  possibly reflect an additional metastatic lesion.  MRI of the lumbar spine revealed a 8 mm enhancing lesion within the inferior aspect of the L5 vertebral body, noted as nonspecific, but possibly reflective of a small metastatic lesion. No evidence of metastatic disease was seen within the cervical spine on MRI. US of the chest on 11/11 revealed again a small left pleural effusion. I have personally reviewed his recent images.  Subsequently, the patient underwent T3-T7 spinal fusion, T4-T6 laminectomy, and decompression of T5 on 04/02/21 while admitted. Pathology from T5 bone lesions revealed squamous cell carcinoma.   He is currently in a nursing home and dislikes the food. He reports he is seen by nutritionist there. Never had a feeding tube.  Weight Changes: he reports weight loss over the years, fasts intermittently.  Other symptoms: weakness/numbness of bilateral lower extremities prompting hospital admission on 04/01/21.  Still wheelchair bound, but able to move arms and legs.  Tobacco history, if any: former smoker   ETOH abuse, if any: does not currently drink  Prior cancers, if any: yes considering initial diagnosis of tonsillar cancer in 2016  PREVIOUS RADIATION/SYSTEMIC THERAPY: Yes  -- 08/2014 - 09/2014 Combination Chemo / RT Therapy  Definitive CRT 70 Gy in 35 fractions with cisplatin (20 mg/m2 x days 1-5, weeks 1 and 5) -- 10/2017 - 12/2018 Systemic Therapy  Carboplatin/docetaxel/pembrolizumab x5 followed by maintenance pembrolizumab discontinued ultimately due to progression in August 2020. Progression occurred in 06/2018, 09/2018, and 12/2018 while on pembrolizumab which was continued due to what was characterized as slow, mild PD  PAST MEDICAL HISTORY:  has a past medical history of Blood in stool and Cancer ([place]) (08/05/14).    PAST SURGICAL HISTORY: Past Surgical History:  Procedure Laterality Date   EYE SURGERY     KNEE SURGERY     LUMBAR PERCUTANEOUS PEDICLE SCREW 4 LEVEL  N/A 04/02/2021   Procedure: THORACIC THREE-THORACIC SEVEN, INSTRUMENTATION AND FUSION, THORACIC FOUR-THORACIC SIX LAMINECTOMY, THORACIC FIVE TRANSPEDICULAR DECOMPRESSION;  Surgeon: Dawley, Theodoro Doing, DO;  Location: [place];  Service: Neurosurgery;  Laterality: N/A;   tonsil biopsy Right 08/05/14   invasive squamous cell carcinoma    FAMILY HISTORY: family history includes Diabetes in his father, paternal grandfather, and paternal grandmother; Hypertension in his mother.  SOCIAL HISTORY:  reports that he has quit smoking. He has never used smokeless tobacco. He reports that he does not currently use alcohol. He reports that he does not use drugs.  ALLERGIES: Patient has no known allergies.  MEDICATIONS:  Current Outpatient Medications  Medication Sig Dispense Refill   bisacodyl (DULCOLAX) 10 MG suppository Place 10 mg rectally as needed for moderate constipation.     dexamethasone (DECADRON) 2 MG tablet Take 2 mg by mouth 2 (two) times daily.     docusate (COLACE) 50 MG/5ML liquid Take 10 mLs (100 mg total) by mouth 2 (two) times daily. 100 mL 0   Ensure Plus (ENSURE PLUS) LIQD Take 237 mLs by mouth in the morning, at noon, and at bedtime.     levothyroxine (SYNTHROID) 50 MCG tablet Take 50 mcg by mouth daily.     lidocaine (LIDODERM) 5 % Place 1 patch onto the skin daily as needed (pain).     Magnesium Hydroxide (MILK OF MAGNESIA PO) Take 30 mLs by mouth as needed.     Multiple Vitamins-Minerals (MULTIVITAMIN ADULT) TABS  Take 1 tablet by mouth daily. with Omega 3     polyethylene glycol (MIRALAX / GLYCOLAX) 17 g packet Take 17 g by mouth daily as needed for mild constipation. 14 each 0   Sodium Phosphates (RA SALINE ENEMA RE) Place 1 Dose rectally as needed.     No current facility-administered medications for this encounter.    REVIEW OF SYSTEMS:  Notable for that above.   PHYSICAL EXAM:  height is 5\' 10"  (1.778 m). His temporal temperature is 97.5 F (36.4 C) (abnormal). His blood  pressure is 86/70 (abnormal) and his pulse is 88. His respiration is 18 and oxygen saturation is 99%.   General: Alert and oriented, in no acute distress, thin HEENT: Head is normocephalic. Extraocular movements are intact. Oropharynx is notable for no lesions. Oral Tongue has a couple dark spots, benign appearing. No thrush. Heart: Regular in rate and rhythm with no murmurs, rubs, or gallops. Chest: Clear to auscultation bilaterally, with no rhonchi, wheezes, or rales. Extremities: No cyanosis or edema. Musculoskeletal: cannot walk independently, but has grossly symmetric strength and muscle tone throughout to light resistance Neurologic: Cranial nerves II through XII are grossly intact. No obvious focalities. Speech is fluent. Coordination is intact. Psychiatric: He is a highly tangential yet educated historian.    Skin: bandage removed from back - T spine surgical site shows satisfactory healing with well approximated scar and without any ooze or sign of infection  ECOG = 3  0 - Asymptomatic (Fully active, able to carry on all predisease activities without restriction)  1 - Symptomatic but completely ambulatory (Restricted in physically strenuous activity but ambulatory and able to carry out work of a light or sedentary nature. For example, light housework, office work)  2 - Symptomatic, <50% in bed during the day (Ambulatory and capable of all self care but unable to carry out any work activities. Up and about more than 50% of waking hours)  3 - Symptomatic, >50% in bed, but not bedbound (Capable of only limited self-care, confined to bed or chair 50% or more of waking hours)  4 - Bedbound (Completely disabled. Cannot carry on any self-care. Totally confined to bed or chair)  5 - Death   Eustace Pen MM, Creech RH, Tormey DC, et al. [phone]). "Toxicity and response criteria of the [facility]". [place] Oncol. 5 (6): 649-55   LABORATORY DATA:  Lab Results  Component  Value Date   WBC 18.9 04/14/2021   HGB 12.8 (A) 04/14/2021   HCT 37 (A) 04/14/2021   MCV 85.6 04/04/2021   PLT 224 04/14/2021   CMP     Component Value Date/Time   NA 135 (A) 04/14/2021 0000   K 4.4 04/14/2021 0000   CL 102 04/14/2021 0000   CO2 23 (A) 04/14/2021 0000   GLUCOSE 148 (H) 04/04/2021 0340   BUN 49 (A) 04/14/2021 0000   CREATININE 0.6 04/14/2021 0000   CREATININE 0.69 04/04/2021 0340   CREATININE 0.83 09/11/2020 0000   CALCIUM 8.3 (A) 04/14/2021 0000   PROT 5.2 (L) 04/04/2021 0340   ALBUMIN 3.0 (L) 04/04/2021 0340   AST 31 04/04/2021 0340   ALT 11 04/04/2021 0340   ALKPHOS 58 04/04/2021 0340   BILITOT 0.8 04/04/2021 0340   GFRNONAA >60 04/04/2021 0340   GFRNONAA 94 09/11/2020 0000   GFRAA 109 09/11/2020 0000      Lab Results  Component Value Date   TSH 1.47 09/11/2020     RADIOGRAPHY: CT Angio  Chest PE W and/or Wo Contrast  Result Date: 04/02/2021 CLINICAL DATA:  Weakness, T4-6 tumor, history of right tonsillar cancer, elevated D-dimer, evaluate for PE EXAM: CT ANGIOGRAPHY CHEST WITH CONTRAST TECHNIQUE: Multidetector CT imaging of the chest was performed using the standard protocol during bolus administration of intravenous contrast. Multiplanar CT image reconstructions and MIPs were obtained to evaluate the vascular anatomy. CONTRAST:  64mL OMNIPAQUE IOHEXOL 350 MG/ML SOLN COMPARISON:  None. FINDINGS: Cardiovascular: Satisfactory opacification of the bilateral pulmonary arteries to the lobar level. Narrowing of the left main pulmonary artery due to tumor. No evidence of pulmonary embolism. Although not tailored for evaluation of the thoracic aorta, there is no evidence thoracic aortic aneurysm. Mild atherosclerotic calcifications. The heart is normal in size.  No pericardial effusion. Mediastinum/Nodes: Tumor along the anterior mediastinum/AP window and extending to the medial lung. This will be described below. Visualized thyroid is unremarkable. Lungs/Pleura:  Mass involving the medial aspect of the left upper lobe and superior segment left lower lobe, extending to the mediastinum, measuring 14.1 x 4.7 cm (series 6/image 93). Moderate left pleural effusion, partially loculated, with associated apical capping. Associated left lower lobe opacity, likely compressive atelectasis. Trace right pleural effusion. 9 mm nodule in the superior segment right lower lobe (series 7/image 36). No pneumothorax. Upper Abdomen: Visualized upper abdomen is grossly unremarkable. Musculoskeletal: Moderate compression fracture at T5, pathologic. Total spine MRI has been performed and further evaluation will be deferred to that study. Otherwise, no focal osseous lesions. Review of the MIP images confirms the above findings. IMPRESSION: No evidence of pulmonary embolism. 14.1 x 4.7 cm mass involving the medial left lung and extending to the mediastinum, as above. Moderate left pleural effusion, partially loculated, with apical capping. Trace right pleural effusion. Moderate pathologic compression fracture at T5. Additional spinal involvement will be described on dedicated total spine MR, reported separately. Aortic Atherosclerosis (ICD10-I70.0). Electronically Signed   By: Julian Hy M.D.   On: 04/02/2021 01:31   MR Cervical Spine W or Wo Contrast  Result Date: 04/02/2021 CLINICAL DATA:  Initial evaluation for metastatic disease evaluation. EXAM: MRI CERVICAL SPINE WITHOUT AND WITH CONTRAST TECHNIQUE: Multiplanar and multiecho pulse sequences of the cervical spine, to include the craniocervical junction and cervicothoracic junction, were obtained without and with intravenous contrast. CONTRAST:  5.60mL GADAVIST GADOBUTROL 1 MMOL/ML IV SOLN COMPARISON:  None. FINDINGS: Alignment: Physiologic with preservation of the normal cervical lordosis. No listhesis. Vertebrae: Vertebral body height maintained without acute or chronic fracture. Bone marrow signal intensity within normal limits. No  discrete osseous lesions or evidence for metastatic disease. No abnormal edema or enhancement. Cord: Normal signal morphology. Posterior Fossa, vertebral arteries, paraspinal tissues: Visualized brain and posterior fossa within normal limits. Craniocervical junction normal. Paraspinous soft tissues within normal limits. Normal flow voids seen within the vertebral arteries bilaterally. Disc levels: C2-C3: Right paracentral disc protrusion indents the right ventral thecal sac. No significant spinal stenosis or cord impingement. Foramina remain patent. C3-C4: Right paracentral disc protrusion indents the right ventral thecal sac. No significant spinal stenosis or cord impingement. Foramina remain patent. C4-C5: Mild degenerative intervertebral disc space narrowing. Superimposed right paracentral disc protrusion indents the ventral thecal sac, contacting and minimally flattening the ventral cord. Thecal sac remains widely patent without significant spinal stenosis. Bilateral uncovertebral spurring with resultant mild right C5 foraminal stenosis. Left neural foramen remains patent. C5-C6: Mild degenerative intervertebral disc space narrowing. Right eccentric disc bulge with associated right greater than left uncovertebral spurring. Broad posterior disc osteophyte mildly  flattens the ventral thecal sac without significant spinal stenosis. Moderate right C6 foraminal narrowing. Left neural foramina remains patent. C6-C7: Mild degenerative intervertebral disc space narrowing. Right eccentric disc osteophyte complex mildly flattens the ventral thecal sac without significant spinal stenosis. Mild to moderate right C7 foraminal narrowing. Left neural foramen remains patent. C7-T1: Minimal disc bulge. Mild facet hypertrophy. No spinal stenosis. Foramina remain patent. IMPRESSION: 1. No evidence for metastatic disease within the cervical spine. 2. Small right paracentral disc protrusions at C2-3 through C4-5 without significant  spinal stenosis. 3. Right eccentric disc osteophyte complexes at C5-6 and C6-7 with resultant mild to moderate right C6 and C7 foraminal stenosis as above. Electronically Signed   By: Jeannine Boga M.D.   On: 04/02/2021 01:05   MR THORACIC SPINE W WO CONTRAST  Result Date: 04/02/2021 CLINICAL DATA:  Initial metastatic evaluation. EXAM: MRI THORACIC WITHOUT AND WITH CONTRAST TECHNIQUE: Multiplanar and multiecho pulse sequences of the thoracic spine were obtained without and with intravenous contrast. CONTRAST:  5.24mL GADAVIST GADOBUTROL 1 MMOL/ML IV SOLN COMPARISON:  None available. FINDINGS: Alignment: Dextroscoliosis. Focal kyphotic angulation of the thoracic spine at the level of T5. No listhesis. Vertebrae: Large contiguous infiltrative and destructive metastatic deposit seen involving the upper-midthoracic spine. Lesion involves the T4 through T7 vertebral bodies, and is centered at the left paraspinous region. Although measurements are somewhat difficult, lesion measures approximately 7.7 x 8.4 x 10.9 cm in greatest dimensions (AP by transverse by craniocaudad). Lesion involves the left hilar/perihilar region, tracking posteriorly along the posteromedial left lung, involving the adjacent left mediastinum and partially encasing the aorta. Direct extension of this mass into the adjacent posterior ribs and T4 through T7 vertebral bodies, with involvement of their posterior elements at several levels. There is an associated pathologic fracture of T5 with approximately 60% height loss. Epidural extension of tumor to involve the spinal canal, extending from T3 through T7. This is most pronounced at the level of T5 where there is circumferential epidural involvement. Associated severe spinal stenosis at the level of T5 with cord compression and associated cord signal changes (series 17, image 8). Thecal sac measures 4 mm in AP diameter at its most narrow point. Tumor also extends to involve the left neural  foramina at T4-5 through T6-7, with probable early extension into the left T3-4 and T7-8 foramina. Involvement of the right T4-5 and T6-7 neural foramina noted as well. There is an additional 1 cm T1 hypointense enhancing lesion within the T11 vertebral body, indeterminate, but could reflect an additional metastatic lesion (series 19, image 7). Otherwise, no other metastatic disease seen within the thoracic spine. Multiple scattered benign hemangiomata noted. No other acute or chronic fracture. Cord: Epidural tumor with associated severe spinal stenosis and cord signal changes at T4-5 through T6-7 as above, most pronounced at the T5 level. Otherwise, signal intensity within the thoracic cord is within normal limits. No other epidural involvement. Paraspinal and other soft tissues: Metastatic lesion involving the left paraspinous and posteromedial left lung at T4 through T7 as above. Associated atelectasis with irregular and partially loculated left pleural effusion partially visualized. Direct extension of tumor to involve the left posterior soft tissues of the left mid back noted as well. Disc levels: No significant underlying disc pathology for age. No other significant stenosis or impingement. IMPRESSION: 1. Large destructive and infiltrative metastatic deposit centered at the left paraspinous region, with involvement of the T4 through T7 vertebral bodies as above. Associated epidural extension with severe spinal stenosis, cord  compression, and cord signal changes at the levels of T4-5 through T6-7. 2. Metastatic lesion extends to involve the adjacent posteromedial aspect of the left lung as well as the left adjacent mediastinum and left hilar/perihilar region, also seen on corresponding CTA of the chest. 3. Associated pathologic fracture of T5 with up to 60% height loss. 4. 1 cm enhancing lesion within the T11 vertebral body, indeterminate, but could reflect an additional metastatic lesion. Attention at  follow-up recommended. Critical Value/emergent results were called by telephone at the time of interpretation on 04/02/2021 at 1:32 am to provider Dr. Florina Ou , who verbally acknowledged these results. Electronically Signed   By: Jeannine Boga M.D.   On: 04/02/2021 01:41   MR Lumbar Spine W Wo Contrast  Result Date: 04/02/2021 CLINICAL DATA:  Initial evaluation for metastatic evaluation. EXAM: MRI LUMBAR SPINE WITHOUT AND WITH CONTRAST TECHNIQUE: Multiplanar and multiecho pulse sequences of the lumbar spine were obtained without and with intravenous contrast. CONTRAST:  5.5mL GADAVIST GADOBUTROL 1 MMOL/ML IV SOLN COMPARISON:  None available. FINDINGS: Segmentation: Standard. Lowest well-formed disc space labeled the L5-S1 level. Alignment: Physiologic with preservation of the normal lumbar lordosis. No listhesis. Vertebrae: Vertebral body height maintained without acute or chronic fracture. Prominent benign hemangioma noted within the T12 vertebral body. There is a 8 mm T1 hypointense enhancing lesion within the inferior aspect of the L5 vertebral body, nonspecific, but could reflect a small metastatic lesion (series 23, image 6). No other evidence for metastatic disease seen within the lumbar spine. Conus medullaris and cauda equina: Conus extends to the L1 level. Conus and cauda equina appear normal. Paraspinal and other soft tissues: Mild diffuse edema within the lower posterior paraspinous musculature, nonspecific, but could reflect muscular injury and/or strain. Soft tissues demonstrate no other acute finding. Prominent distension of the partially visualized urinary bladder. Visualized visceral structures otherwise unremarkable. Disc levels: L1-2:  Negative interspace.  Mild facet hypertrophy.  No stenosis. L2-3:  Negative interspace.  Mild facet hypertrophy.  No stenosis. L3-4:  Negative interspace.  Mild facet hypertrophy.  No stenosis. L4-5: Disc desiccation with minimal disc bulge. Superimposed  tiny central disc protrusion with annular fissure. Mild facet hypertrophy. Mild narrowing of the lateral recesses. Central canal remains patent. No foraminal stenosis. L5-S1: Degenerative intervertebral disc space narrowing with disc desiccation and mild diffuse disc bulge. Mild reactive endplate spurring. Mild facet hypertrophy. No significant spinal stenosis. Foramina remain patent. IMPRESSION: 1. 8 mm enhancing lesion within the inferior aspect of the L5 vertebral body, nonspecific, but could reflect a small metastatic lesion. Attention at follow-up recommended. 2. No other evidence for metastatic disease within the lumbar spine. 3. Mild diffuse edema within the lower posterior paraspinous musculature, nonspecific, but could reflect muscular injury and/or strain. 4. Prominent distension of the visualized urinary bladder. Query urinary retention. 5. Mild for age spondylosis without significant stenosis. Electronically Signed   By: Jeannine Boga M.D.   On: 04/02/2021 01:51   [place] CHEST (PLEURAL EFFUSION)  Result Date: 04/02/2021 CLINICAL DATA:  Pleural effusion. EXAM: CHEST ULTRASOUND COMPARISON:  CTA Chest earlier same day FINDINGS: Small left pleural effusion.  No discernible right effusion. IMPRESSION: Small left pleural effusion. Inadequate volume for safe image guided thoracentesis. Electronically Signed   By: Misty Stanley M.D.   On: 04/02/2021 12:23   DG Thoracic Spine 1 View  Result Date: 04/03/2021 CLINICAL DATA:  T3-7 fusion EXAM: OPERATIVE THORACIC SPINE 1 VIEW(S) COMPARISON:  None. FLUOROSCOPY TIME:  36 seconds FINDINGS: Intraoperative fluoroscopic image during  T3-7 fusion with pedicle screws. IMPRESSION: Intraoperative fluoroscopic images, as above. Electronically Signed   By: Julian Hy M.D.   On: 04/03/2021 02:56   DG Chest Portable 1 View  Result Date: 04/01/2021 CLINICAL DATA:  Chest tightness, tumor at T4-6 EXAM: PORTABLE CHEST 1 VIEW COMPARISON:  11/09/2017 FINDINGS:  Left perihilar/mediastinal mass, progressive. Associated apical pleural thickening versus fluid, new. Right lung is clear.  No pneumothorax. The heart is normal in size. IMPRESSION: Left perihilar/mediastinal mass in this patient with known T4-6 tumor, progressive. Associated apical pleural thickening/versus fluid, new. Electronically Signed   By: Julian Hy M.D.   On: 04/01/2021 20:24   DG C-Arm 1-60 Min-No Report  Result Date: 04/03/2021 CLINICAL DATA:  T3-7 fusion EXAM: OPERATIVE THORACIC SPINE 1 VIEW(S) COMPARISON:  None. FLUOROSCOPY TIME:  36 seconds FINDINGS: Intraoperative fluoroscopic image during T3-7 fusion with pedicle screws. IMPRESSION: Intraoperative fluoroscopic images, as above. Electronically Signed   By: Julian Hy M.D.   On: 04/03/2021 02:56   DG C-Arm 1-60 Min-No Report  Result Date: 04/03/2021 CLINICAL DATA:  T3-7 fusion EXAM: OPERATIVE THORACIC SPINE 1 VIEW(S) COMPARISON:  None. FLUOROSCOPY TIME:  36 seconds FINDINGS: Intraoperative fluoroscopic image during T3-7 fusion with pedicle screws. IMPRESSION: Intraoperative fluoroscopic images, as above. Electronically Signed   By: Julian Hy M.D.   On: 04/03/2021 02:56   VAS [place] LOWER EXTREMITY VENOUS (DVT)  Result Date: 04/02/2021  Lower Venous DVT Study Patient Name:  HENSLEY TREAT. [facility]  Date of Exam:   04/02/2021 Medical Rec #: [id number]          Accession #:    [id number] Date of Birth: 01-10-1956          Patient Gender: M Patient Age:   76 years Exam Location:  [facility] Procedure:      VAS [place] LOWER EXTREMITY VENOUS (DVT) Referring Phys: Wandra Feinstein RATHORE --------------------------------------------------------------------------------  Indications: Elevated Ddimer.  Risk Factors: Cancer. Limitations: Patient positioning and body habitus. Comparison Study: No prior studies. Performing Technologist: Oliver Hum RVT  Examination Guidelines: A complete evaluation includes B-mode imaging, spectral  Doppler, color Doppler, and power Doppler as needed of all accessible portions of each vessel. Bilateral testing is considered an integral part of a complete examination. Limited examinations for reoccurring indications may be performed as noted. The reflux portion of the exam is performed with the patient in reverse Trendelenburg.  +---------+---------------+---------+-----------+----------+--------------+ RIGHT    CompressibilityPhasicitySpontaneityPropertiesThrombus Aging +---------+---------------+---------+-----------+----------+--------------+ CFV      Full           Yes      Yes                                 +---------+---------------+---------+-----------+----------+--------------+ SFJ      Full                                                        +---------+---------------+---------+-----------+----------+--------------+ FV Prox  Full                                                        +---------+---------------+---------+-----------+----------+--------------+ FV Mid  Full                                                        +---------+---------------+---------+-----------+----------+--------------+ FV DistalFull                                                        +---------+---------------+---------+-----------+----------+--------------+ PFV      Full                                                        +---------+---------------+---------+-----------+----------+--------------+ POP      Full           Yes      Yes                                 +---------+---------------+---------+-----------+----------+--------------+ PTV      Full                                                        +---------+---------------+---------+-----------+----------+--------------+ PERO     Full                                                        +---------+---------------+---------+-----------+----------+--------------+    +---------+---------------+---------+-----------+----------+--------------+ LEFT     CompressibilityPhasicitySpontaneityPropertiesThrombus Aging +---------+---------------+---------+-----------+----------+--------------+ CFV      Full           Yes      Yes                                 +---------+---------------+---------+-----------+----------+--------------+ SFJ      Full                                                        +---------+---------------+---------+-----------+----------+--------------+ FV Prox  Full                                                        +---------+---------------+---------+-----------+----------+--------------+ FV Mid   Full                                                        +---------+---------------+---------+-----------+----------+--------------+  FV DistalFull                                                        +---------+---------------+---------+-----------+----------+--------------+ PFV      Full                                                        +---------+---------------+---------+-----------+----------+--------------+ POP      Full           Yes      Yes                                 +---------+---------------+---------+-----------+----------+--------------+ PTV      Full                                                        +---------+---------------+---------+-----------+----------+--------------+ PERO     Full                                                        +---------+---------------+---------+-----------+----------+--------------+     Summary: RIGHT: - There is no evidence of deep vein thrombosis in the lower extremity.  - No cystic structure found in the popliteal fossa.  LEFT: - There is no evidence of deep vein thrombosis in the lower extremity.  - No cystic structure found in the popliteal fossa.  *See table(s) above for measurements and observations. Electronically signed  by Servando Snare MD on 04/02/2021 at 4:26:00 PM.    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 I recommend 2 weeks of palliative radiotherapy for this patient.  I will target the thoracic spine (focusing on the region where he underwent surgical stabilization) and simultaneously treat the dominant disease in his central chest.  We discussed the potential risks, benefits, and side effects of radiotherapy. We talked in detail about acute and late effects. We discussed that some of the most bothersome acute effects may be mucositis, esophagitis , skin irritation, fatigue. We talked about late effects which include but are not necessarily limited to injury of the internal organs in the thorax including esophagus, spinal cord, heart, lungs. No guarantees of treatment were given. A consent form was signed and placed in the patient's medical record. The patient is enthusiastic about proceeding with treatment. I look forward to participating in the patient's care.    Simulation (treatment planning) will take place tomorrow   Referral to social work for additional support.  The patient has concerns about his nursing home.  He appears to have limited additional support from friends and family.  Note written to this nursing home recommending close follow up with nutritionist, and high-calorie high-protein foods, and adequate hydration.  Low blood  pressure noted to nursing home. Patient has his own ideas about nutritional strategies including fasting.  I explained the importance of high-calorie high-protein foods and recommended he not worry about saturated fat or cholesterol at this time.  He is interested in clinical trials for continued systemic therapy.  I am not sure that he is eligible for any and I am not sure that it would be feasible for [place] to [place] in the near future.  However he would like to have a reconsultation with his medical  oncologist at [facility], where he is familiar with the staff and well-known.  I will notify her head and neck patient navigator about this so that a consultation can be arranged, perhaps even by telemedicine.  He understands that he may ultimately need to pursue systemic therapy here, closer to home.  On date of service, in total, I spent 65 minutes on this encounter. Patient was seen in person.  __________________________________________   Eppie Gibson, MD  This document serves as a record of services personally performed by Eppie Gibson, MD. It was created on her behalf by Roney Mans, a trained medical scribe. The creation of this record is based on the scribe's personal observations and the provider's statements to them. This document has been checked and approved by the attending provider.

## 2021-04-19 NOTE — Progress Notes (Incomplete)
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 Associated epidural extension with severe spinal stenosis, cord compression, and cord signal changes at the levels of T4-5 through T6-7.  Metastatic lesion extends to involve the adjacent posteromedial aspect of the left lung as well as the left adjacent mediastinum and left hilar/perihilar region, also seen on corresponding CTA of the chest. Associated pathologic fracture of T5 with up to 60% height loss. 1 cm enhancing lesion within the T11 vertebral body, indeterminate, but could reflect an additional metastatic lesion.  Biopsies revealed 04/03/2021 FINAL MICROSCOPIC DIAGNOSIS:  A. BONE, T5 LESION, BIOPSY:  - Metastatic squamous cell carcinoma  Past or anticipated interventions, if any, per neurosurgery:  04/03/2021 --Dr. Pieter Partridge Dawley T3-4, T4-5, T5-6, T6-7 posterior lateral instrumentation and arthrodesis Segmental pedicle screw instrumentation, bilateral T3, T4, T6, T7; K2 M Everest pedicle screws (T3 4.5 mm x 40 mm bilaterally, T4 4.5 mm x 40 mm bilaterally, T6 6.5 mm x 40 mm bilaterally, T7 6.5 mm x 40 mm bilaterally) Left T5 transpedicular decompression for decompression of neural elements Bilateral T4, T5, T6 laminectomies for resection of epidural tumor Intraoperative use of allograft Intraoperative use of microscope for microdissection Intraoperative use of fluoroscopy  Past/Anticipated chemotherapy by medical oncology, if any:  Under care of Dr. Betsy Coder (while hospitalized) 04/06/2021 --Recommendations: Referral to radiation oncology as an outpatient for palliative radiation to his spine. Taper steroids per  neurosurgery Medical oncology at [facility] if he prefers as he is already established there versus following up locally  Patient's main complaints related to symptomatic tumor(s) are: ***  Pain on a scale of 0-10 is: ***   If Spine Met(s), symptoms, if any, include: Bowel/Bladder retention or incontinence (please describe): *** Numbness or weakness in extremities (please describe): *** Current Decadron regimen, if applicable: ***2 mg PO twice a day  Ambulatory status? Walker? Wheelchair?: ***  SAFETY ISSUES: Prior radiation? Yes 08/2014-09/2014-CRT 70 Gray in 35 fractions with cisplatin 20 mg per metered squared days 1-5, weeks 1 and 5 Pacemaker/ICD? *** Possible current pregnancy? N/A Is the patient on methotrexate? ***  Additional Complaints / other details:  ***

## 2021-04-20 ENCOUNTER — Encounter: Payer: Self-pay | Admitting: Radiation Oncology

## 2021-04-20 ENCOUNTER — Non-Acute Institutional Stay (SKILLED_NURSING_FACILITY): Payer: Medicare PPO | Admitting: Adult Health

## 2021-04-20 ENCOUNTER — Encounter: Payer: Self-pay | Admitting: Adult Health

## 2021-04-20 ENCOUNTER — Ambulatory Visit
Admission: RE | Admit: 2021-04-20 | Discharge: 2021-04-20 | Disposition: A | Discharge status: Home / Self Care | Payer: Medicare PPO | Source: Ambulatory Visit | Attending: Radiation Oncology | Admitting: Radiation Oncology

## 2021-04-20 ENCOUNTER — Other Ambulatory Visit: Payer: Self-pay

## 2021-04-20 ENCOUNTER — Telehealth: Payer: Self-pay | Admitting: Surgery

## 2021-04-20 VITALS — BP 86/70 | HR 88 | Temp 97.5°F | Resp 18 | Ht 70.0 in

## 2021-04-20 DIAGNOSIS — M4802 Spinal stenosis, cervical region: Secondary | ICD-10-CM | POA: Diagnosis not present

## 2021-04-20 DIAGNOSIS — R200 Anesthesia of skin: Secondary | ICD-10-CM | POA: Diagnosis not present

## 2021-04-20 DIAGNOSIS — C7951 Secondary malignant neoplasm of bone: Secondary | ICD-10-CM | POA: Diagnosis present

## 2021-04-20 DIAGNOSIS — I700 Atherosclerosis of aorta: Secondary | ICD-10-CM | POA: Insufficient documentation

## 2021-04-20 DIAGNOSIS — R1312 Dysphagia, oropharyngeal phase: Secondary | ICD-10-CM | POA: Diagnosis not present

## 2021-04-20 DIAGNOSIS — M47819 Spondylosis without myelopathy or radiculopathy, site unspecified: Secondary | ICD-10-CM | POA: Insufficient documentation

## 2021-04-20 DIAGNOSIS — J90 Pleural effusion, not elsewhere classified: Secondary | ICD-10-CM | POA: Insufficient documentation

## 2021-04-20 DIAGNOSIS — D492 Neoplasm of unspecified behavior of bone, soft tissue, and skin: Secondary | ICD-10-CM | POA: Diagnosis not present

## 2021-04-20 DIAGNOSIS — K5909 Other constipation: Secondary | ICD-10-CM

## 2021-04-20 DIAGNOSIS — C099 Malignant neoplasm of tonsil, unspecified: Secondary | ICD-10-CM | POA: Diagnosis not present

## 2021-04-20 DIAGNOSIS — C7802 Secondary malignant neoplasm of left lung: Secondary | ICD-10-CM | POA: Insufficient documentation

## 2021-04-20 DIAGNOSIS — Z9221 Personal history of antineoplastic chemotherapy: Secondary | ICD-10-CM | POA: Diagnosis not present

## 2021-04-20 DIAGNOSIS — Z79899 Other long term (current) drug therapy: Secondary | ICD-10-CM | POA: Diagnosis not present

## 2021-04-20 DIAGNOSIS — Z923 Personal history of irradiation: Secondary | ICD-10-CM | POA: Diagnosis not present

## 2021-04-20 DIAGNOSIS — M2578 Osteophyte, vertebrae: Secondary | ICD-10-CM | POA: Insufficient documentation

## 2021-04-20 DIAGNOSIS — Z87891 Personal history of nicotine dependence: Secondary | ICD-10-CM | POA: Insufficient documentation

## 2021-04-20 NOTE — Telephone Encounter (Signed)
The pt's brother Roderic Palau left a message concerned about when his brother would be in to see Dr. [name].  Per Dr. [name], the pt will be scheduled for a consult with our office about halfway through the pt's radiation treatment.  Until we know how long and when the pt's radiation will start, we cannot schedule the pt.  Roderic Palau verbalized understanding of these instructions, and also wanted to let Dr. [name] know that his brother's WBC from 04/16/21 was 18.9.  Dr. [name] was notified of the pt's WBC.

## 2021-04-20 NOTE — Progress Notes (Signed)
Location:  [facility] [place] Room Number: 810-F Place of Service:  SNF (31) Provider:  Durenda Age, DNP, FNP-BC  Patient Care Team: Patient, [name] as PCP - General (General Practice)  Extended Emergency Contact Information Primary Emergency Contact: Sigfredo, Schre[place] Mobile Phone: [phone] Relation: Brother Secondary Emergency Contact: [place] Phone: [phone] Relation: Mother  Code Status: DNR   Goals of care: Advanced Directive information Advanced Directives 04/20/2021  Does Patient Have a Medical Advance Directive? Yes  Type of Paramedic of [place];Living will  Does patient want to make changes to medical advance directive? No - Patient declined  Copy of [place] in Chart? Yes - validated most recent copy scanned in chart (See row information)     Chief Complaint  Patient presents with   Acute Visit    Short term rehabilitation.     HPI:  Pt is a 65 y.o. male seen today for an acute visit.  He is a short-term rehabilitation resident of [facility].  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  He stated he is not able to swallow his current diet which is mechanical soft.  He requested to be put on pured diet.  He complained that docusate liquid burns his throat.  He has chronic constipation in currently on docusate liquid 100 mg twice a day and MiraLAX 17 g daily PRN.    Past Medical History:  Diagnosis Date   Blood in stool    Cancer ([place]) 08/05/14   right tonsil, P16 positive   Past Surgical History:  Procedure Laterality Date   EYE SURGERY     KNEE SURGERY     LUMBAR PERCUTANEOUS PEDICLE SCREW 4 LEVEL N/A 04/02/2021   Procedure: THORACIC  THREE-THORACIC SEVEN, INSTRUMENTATION AND FUSION, THORACIC FOUR-THORACIC SIX LAMINECTOMY, THORACIC FIVE TRANSPEDICULAR DECOMPRESSION;  Surgeon: Dawley, Theodoro Doing, DO;  Location: [facility];  Service: Neurosurgery;  Laterality: N/A;   tonsil biopsy Right 08/05/14   invasive squamous cell carcinoma    No Known Allergies  Outpatient Encounter Medications as of 04/20/2021  Medication Sig   bisacodyl (DULCOLAX) 10 MG suppository Place 10 mg rectally as needed for moderate constipation.   dexamethasone (DECADRON) 2 MG tablet Take 2 mg by mouth 2 (two) times daily.   docusate (COLACE) 50 MG/5ML liquid Take 10 mLs (100 mg total) by mouth 2 (two) times daily.   Ensure Plus (ENSURE PLUS) LIQD Take 237 mLs by mouth in the morning, at noon, and at bedtime.   levothyroxine (SYNTHROID) 50 MCG tablet Take 50 mcg by mouth daily.   lidocaine (LIDODERM) 5 % Place 1 patch onto the skin daily as needed (pain).   Magnesium Hydroxide (MILK OF MAGNESIA PO) Take 30 mLs by mouth as needed.   Multiple Vitamins-Minerals (MULTIVITAMIN ADULT) TABS Take 1 tablet by mouth daily. with Omega 3   polyethylene glycol (MIRALAX / GLYCOLAX) 17 g packet Take 17 g by mouth daily as needed for mild constipation.   Sodium Phosphates (RA SALINE ENEMA RE) Place 1 Dose rectally as needed.   No facility-administered encounter medications on file as of 04/20/2021.    Review of Systems  GENERAL: No change in appetite, no fatigue, no weight changes, no fever or chills MOUTH and THROAT: Denies oral discomfort, gingival  pain or bleeding, RESPIRATORY: no cough, SOB, DOE, wheezing, hemoptysis CARDIAC: No chest pain, edema or palpitations GI: No abdominal pain, diarrhea, constipation, heart burn, nausea or vomiting GU: Denies dysuria, frequency, hematuria, incontinence, or discharge NEUROLOGICAL: Denies dizziness, syncope or headache PSYCHIATRIC: Denies feelings of depression or anxiety. No report of hallucinations, insomnia, paranoia, or  agitation   Immunization History  Administered Date(s) Administered   Janssen (J&J) SARS-COV-2 Vaccination 08/31/2019   PPD Test 09/14/2017   Pertinent  Health Maintenance Due  Topic Date Due   COLONOSCOPY (Pts 45-55yrs Insurance coverage will need to be confirmed)  Never done   INFLUENZA VACCINE  Never done   Fall Risk 04/07/2021 04/08/2021 04/08/2021 04/09/2021 04/20/2021  Patient Fall Risk Level High fall risk High fall risk High fall risk High fall risk High fall risk     Vitals:   04/20/21 1450  BP: 104/69  Pulse: 78  Resp: 18  Temp: 97.7 F (36.5 C)  Weight: 107 lb 12.8 oz (48.9 kg)  Height: 5\' 10"  (1.778 m)   Body mass index is 15.47 kg/m.  Physical Exam  GENERAL APPEARANCE:  In no acute distress. Cachectic. SKIN:  Skin is warm and dry.  MOUTH and THROAT: Lips are without lesions. Oral mucosa is moist and without lesions.  RESPIRATORY: Breathing is even & unlabored, BS CTAB CARDIAC: RRR, no murmur,no extra heart sounds, no edema GI: Abdomen soft, normal BS, no masses, no tenderness, NEUROLOGICAL: There is no tremor. Speech is clear. Alert and oriented X 3. PSYCHIATRIC:  Affect and behavior are appropriate  Labs reviewed: Recent Labs    09/11/20 0000 04/01/21 1912 04/02/21 2341 04/03/21 0225 04/04/21 0340  NA 141 137 136  --  133*  K 4.7 3.4* 4.2  --  3.8  CL 105 101  --   --  101  CO2 25 27  --   --  25  GLUCOSE 93 97  --   --  148*  BUN 19 20  --   --  17  CREATININE 0.83 0.72  --  0.92 0.69  CALCIUM 9.3 9.3  --   --  8.5*   Recent Labs    09/11/20 0000 04/01/21 1912 04/04/21 0340  AST 17 27 31   ALT 6* 11 11  ALKPHOS  --  82 58  BILITOT 0.6 1.2 0.8  PROT 6.1 7.2 5.2*  ALBUMIN  --  4.2 3.0*   Recent Labs    04/01/21 1912 04/02/21 2341 04/03/21 0225 04/04/21 0340  WBC 6.6  --  10.6* 11.7*  NEUTROABS 5.4  --   --   --   HGB 14.0 9.2* 12.8* 12.2*  HCT 40.9 27.0* 37.1* 35.8*  MCV 88.1  --  86.7 85.6  PLT 274  --  205 224   Lab  Results  Component Value Date   TSH 1.47 09/11/2020   No results found for: HGBA1C Lab Results  Component Value Date   CHOL 164 09/11/2020   HDL 48 09/11/2020   LDLCALC 95 09/11/2020   TRIG 118 09/11/2020   CHOLHDL 3.4 09/11/2020    Significant Diagnostic Results in last 30 days:  CT Angio Chest PE W and/or Wo Contrast  Result Date: 04/02/2021 CLINICAL DATA:  Weakness, T4-6 tumor, history of right tonsillar cancer, elevated D-dimer, evaluate for PE EXAM: CT ANGIOGRAPHY CHEST WITH CONTRAST TECHNIQUE: Multidetector CT imaging of the chest was performed using the standard protocol during bolus administration of intravenous contrast. Multiplanar CT image reconstructions and MIPs were  obtained to evaluate the vascular anatomy. CONTRAST:  68mL OMNIPAQUE IOHEXOL 350 MG/ML SOLN COMPARISON:  None. FINDINGS: Cardiovascular: Satisfactory opacification of the bilateral pulmonary arteries to the lobar level. Narrowing of the left main pulmonary artery due to tumor. No evidence of pulmonary embolism. Although not tailored for evaluation of the thoracic aorta, there is no evidence thoracic aortic aneurysm. Mild atherosclerotic calcifications. The heart is normal in size.  No pericardial effusion. Mediastinum/Nodes: Tumor along the anterior mediastinum/AP window and extending to the medial lung. This will be described below. Visualized thyroid is unremarkable. Lungs/Pleura: Mass involving the medial aspect of the left upper lobe and superior segment left lower lobe, extending to the mediastinum, measuring 14.1 x 4.7 cm (series 6/image 93). Moderate left pleural effusion, partially loculated, with associated apical capping. Associated left lower lobe opacity, likely compressive atelectasis. Trace right pleural effusion. 9 mm nodule in the superior segment right lower lobe (series 7/image 36). No pneumothorax. Upper Abdomen: Visualized upper abdomen is grossly unremarkable. Musculoskeletal: Moderate compression  fracture at T5, pathologic. Total spine MRI has been performed and further evaluation will be deferred to that study. Otherwise, no focal osseous lesions. Review of the MIP images confirms the above findings. IMPRESSION: No evidence of pulmonary embolism. 14.1 x 4.7 cm mass involving the medial left lung and extending to the mediastinum, as above. Moderate left pleural effusion, partially loculated, with apical capping. Trace right pleural effusion. Moderate pathologic compression fracture at T5. Additional spinal involvement will be described on dedicated total spine MR, reported separately. Aortic Atherosclerosis (ICD10-I70.0). Electronically Signed   By: Julian Hy M.D.   On: 04/02/2021 01:31   MR Cervical Spine W or Wo Contrast  Result Date: 04/02/2021 CLINICAL DATA:  Initial evaluation for metastatic disease evaluation. EXAM: MRI CERVICAL SPINE WITHOUT AND WITH CONTRAST TECHNIQUE: Multiplanar and multiecho pulse sequences of the cervical spine, to include the craniocervical junction and cervicothoracic junction, were obtained without and with intravenous contrast. CONTRAST:  5.60mL GADAVIST GADOBUTROL 1 MMOL/ML IV SOLN COMPARISON:  None. FINDINGS: Alignment: Physiologic with preservation of the normal cervical lordosis. No listhesis. Vertebrae: Vertebral body height maintained without acute or chronic fracture. Bone marrow signal intensity within normal limits. No discrete osseous lesions or evidence for metastatic disease. No abnormal edema or enhancement. Cord: Normal signal morphology. Posterior Fossa, vertebral arteries, paraspinal tissues: Visualized brain and posterior fossa within normal limits. Craniocervical junction normal. Paraspinous soft tissues within normal limits. Normal flow voids seen within the vertebral arteries bilaterally. Disc levels: C2-C3: Right paracentral disc protrusion indents the right ventral thecal sac. No significant spinal stenosis or cord impingement. Foramina  remain patent. C3-C4: Right paracentral disc protrusion indents the right ventral thecal sac. No significant spinal stenosis or cord impingement. Foramina remain patent. C4-C5: Mild degenerative intervertebral disc space narrowing. Superimposed right paracentral disc protrusion indents the ventral thecal sac, contacting and minimally flattening the ventral cord. Thecal sac remains widely patent without significant spinal stenosis. Bilateral uncovertebral spurring with resultant mild right C5 foraminal stenosis. Left neural foramen remains patent. C5-C6: Mild degenerative intervertebral disc space narrowing. Right eccentric disc bulge with associated right greater than left uncovertebral spurring. Broad posterior disc osteophyte mildly flattens the ventral thecal sac without significant spinal stenosis. Moderate right C6 foraminal narrowing. Left neural foramina remains patent. C6-C7: Mild degenerative intervertebral disc space narrowing. Right eccentric disc osteophyte complex mildly flattens the ventral thecal sac without significant spinal stenosis. Mild to moderate right C7 foraminal narrowing. Left neural foramen remains patent. C7-T1: Minimal disc bulge. Mild  facet hypertrophy. No spinal stenosis. Foramina remain patent. IMPRESSION: 1. No evidence for metastatic disease within the cervical spine. 2. Small right paracentral disc protrusions at C2-3 through C4-5 without significant spinal stenosis. 3. Right eccentric disc osteophyte complexes at C5-6 and C6-7 with resultant mild to moderate right C6 and C7 foraminal stenosis as above. Electronically Signed   By: Jeannine Boga M.D.   On: 04/02/2021 01:05   MR THORACIC SPINE W WO CONTRAST  Result Date: 04/02/2021 CLINICAL DATA:  Initial metastatic evaluation. EXAM: MRI THORACIC WITHOUT AND WITH CONTRAST TECHNIQUE: Multiplanar and multiecho pulse sequences of the thoracic spine were obtained without and with intravenous contrast. CONTRAST:  5.33mL  GADAVIST GADOBUTROL 1 MMOL/ML IV SOLN COMPARISON:  None available. FINDINGS: Alignment: Dextroscoliosis. Focal kyphotic angulation of the thoracic spine at the level of T5. No listhesis. Vertebrae: Large contiguous infiltrative and destructive metastatic deposit seen involving the upper-midthoracic spine. Lesion involves the T4 through T7 vertebral bodies, and is centered at the left paraspinous region. Although measurements are somewhat difficult, lesion measures approximately 7.7 x 8.4 x 10.9 cm in greatest dimensions (AP by transverse by craniocaudad). Lesion involves the left hilar/perihilar region, tracking posteriorly along the posteromedial left lung, involving the adjacent left mediastinum and partially encasing the aorta. Direct extension of this mass into the adjacent posterior ribs and T4 through T7 vertebral bodies, with involvement of their posterior elements at several levels. There is an associated pathologic fracture of T5 with approximately 60% height loss. Epidural extension of tumor to involve the spinal canal, extending from T3 through T7. This is most pronounced at the level of T5 where there is circumferential epidural involvement. Associated severe spinal stenosis at the level of T5 with cord compression and associated cord signal changes (series 17, image 8). Thecal sac measures 4 mm in AP diameter at its most narrow point. Tumor also extends to involve the left neural foramina at T4-5 through T6-7, with probable early extension into the left T3-4 and T7-8 foramina. Involvement of the right T4-5 and T6-7 neural foramina noted as well. There is an additional 1 cm T1 hypointense enhancing lesion within the T11 vertebral body, indeterminate, but could reflect an additional metastatic lesion (series 19, image 7). Otherwise, no other metastatic disease seen within the thoracic spine. Multiple scattered benign hemangiomata noted. No other acute or chronic fracture. Cord: Epidural tumor with  associated severe spinal stenosis and cord signal changes at T4-5 through T6-7 as above, most pronounced at the T5 level. Otherwise, signal intensity within the thoracic cord is within normal limits. No other epidural involvement. Paraspinal and other soft tissues: Metastatic lesion involving the left paraspinous and posteromedial left lung at T4 through T7 as above. Associated atelectasis with irregular and partially loculated left pleural effusion partially visualized. Direct extension of tumor to involve the left posterior soft tissues of the left mid back noted as well. Disc levels: No significant underlying disc pathology for age. No other significant stenosis or impingement. IMPRESSION: 1. Large destructive and infiltrative metastatic deposit centered at the left paraspinous region, with involvement of the T4 through T7 vertebral bodies as above. Associated epidural extension with severe spinal stenosis, cord compression, and cord signal changes at the levels of T4-5 through T6-7. 2. Metastatic lesion extends to involve the adjacent posteromedial aspect of the left lung as well as the left adjacent mediastinum and left hilar/perihilar region, also seen on corresponding CTA of the chest. 3. Associated pathologic fracture of T5 with up to 60% height loss. 4.  1 cm enhancing lesion within the T11 vertebral body, indeterminate, but could reflect an additional metastatic lesion. Attention at follow-up recommended. Critical Value/emergent results were called by telephone at the time of interpretation on 04/02/2021 at 1:32 am to provider Dr. Florina Ou , who verbally acknowledged these results. Electronically Signed   By: Jeannine Boga M.D.   On: 04/02/2021 01:41   MR Lumbar Spine W Wo Contrast  Result Date: 04/02/2021 CLINICAL DATA:  Initial evaluation for metastatic evaluation. EXAM: MRI LUMBAR SPINE WITHOUT AND WITH CONTRAST TECHNIQUE: Multiplanar and multiecho pulse sequences of the lumbar spine were  obtained without and with intravenous contrast. CONTRAST:  5.67mL GADAVIST GADOBUTROL 1 MMOL/ML IV SOLN COMPARISON:  None available. FINDINGS: Segmentation: Standard. Lowest well-formed disc space labeled the L5-S1 level. Alignment: Physiologic with preservation of the normal lumbar lordosis. No listhesis. Vertebrae: Vertebral body height maintained without acute or chronic fracture. Prominent benign hemangioma noted within the T12 vertebral body. There is a 8 mm T1 hypointense enhancing lesion within the inferior aspect of the L5 vertebral body, nonspecific, but could reflect a small metastatic lesion (series 23, image 6). No other evidence for metastatic disease seen within the lumbar spine. Conus medullaris and cauda equina: Conus extends to the L1 level. Conus and cauda equina appear normal. Paraspinal and other soft tissues: Mild diffuse edema within the lower posterior paraspinous musculature, nonspecific, but could reflect muscular injury and/or strain. Soft tissues demonstrate no other acute finding. Prominent distension of the partially visualized urinary bladder. Visualized visceral structures otherwise unremarkable. Disc levels: L1-2:  Negative interspace.  Mild facet hypertrophy.  No stenosis. L2-3:  Negative interspace.  Mild facet hypertrophy.  No stenosis. L3-4:  Negative interspace.  Mild facet hypertrophy.  No stenosis. L4-5: Disc desiccation with minimal disc bulge. Superimposed tiny central disc protrusion with annular fissure. Mild facet hypertrophy. Mild narrowing of the lateral recesses. Central canal remains patent. No foraminal stenosis. L5-S1: Degenerative intervertebral disc space narrowing with disc desiccation and mild diffuse disc bulge. Mild reactive endplate spurring. Mild facet hypertrophy. No significant spinal stenosis. Foramina remain patent. IMPRESSION: 1. 8 mm enhancing lesion within the inferior aspect of the L5 vertebral body, nonspecific, but could reflect a small metastatic  lesion. Attention at follow-up recommended. 2. No other evidence for metastatic disease within the lumbar spine. 3. Mild diffuse edema within the lower posterior paraspinous musculature, nonspecific, but could reflect muscular injury and/or strain. 4. Prominent distension of the visualized urinary bladder. Query urinary retention. 5. Mild for age spondylosis without significant stenosis. Electronically Signed   By: Jeannine Boga M.D.   On: 04/02/2021 01:51   [place] CHEST (PLEURAL EFFUSION)  Result Date: 04/02/2021 CLINICAL DATA:  Pleural effusion. EXAM: CHEST ULTRASOUND COMPARISON:  CTA Chest earlier same day FINDINGS: Small left pleural effusion.  No discernible right effusion. IMPRESSION: Small left pleural effusion. Inadequate volume for safe image guided thoracentesis. Electronically Signed   By: Misty Stanley M.D.   On: 04/02/2021 12:23   DG Thoracic Spine 1 View  Result Date: 04/03/2021 CLINICAL DATA:  T3-7 fusion EXAM: OPERATIVE THORACIC SPINE 1 VIEW(S) COMPARISON:  None. FLUOROSCOPY TIME:  36 seconds FINDINGS: Intraoperative fluoroscopic image during T3-7 fusion with pedicle screws. IMPRESSION: Intraoperative fluoroscopic images, as above. Electronically Signed   By: Julian Hy M.D.   On: 04/03/2021 02:56   DG Chest Portable 1 View  Result Date: 04/01/2021 CLINICAL DATA:  Chest tightness, tumor at T4-6 EXAM: PORTABLE CHEST 1 VIEW COMPARISON:  11/09/2017 FINDINGS: Left perihilar/mediastinal mass, progressive. Associated  apical pleural thickening versus fluid, new. Right lung is clear.  No pneumothorax. The heart is normal in size. IMPRESSION: Left perihilar/mediastinal mass in this patient with known T4-6 tumor, progressive. Associated apical pleural thickening/versus fluid, new. Electronically Signed   By: Julian Hy M.D.   On: 04/01/2021 20:24   DG C-Arm 1-60 Min-No Report  Result Date: 04/03/2021 CLINICAL DATA:  T3-7 fusion EXAM: OPERATIVE THORACIC SPINE 1 VIEW(S)  COMPARISON:  None. FLUOROSCOPY TIME:  36 seconds FINDINGS: Intraoperative fluoroscopic image during T3-7 fusion with pedicle screws. IMPRESSION: Intraoperative fluoroscopic images, as above. Electronically Signed   By: Julian Hy M.D.   On: 04/03/2021 02:56   DG C-Arm 1-60 Min-No Report  Result Date: 04/03/2021 CLINICAL DATA:  T3-7 fusion EXAM: OPERATIVE THORACIC SPINE 1 VIEW(S) COMPARISON:  None. FLUOROSCOPY TIME:  36 seconds FINDINGS: Intraoperative fluoroscopic image during T3-7 fusion with pedicle screws. IMPRESSION: Intraoperative fluoroscopic images, as above. Electronically Signed   By: Julian Hy M.D.   On: 04/03/2021 02:56   VAS [place] LOWER EXTREMITY VENOUS (DVT)  Result Date: 04/02/2021  Lower Venous DVT Study Patient Name:  AARO MEYERS. [facility]  Date of Exam:   04/02/2021 Medical Rec #: [id number]          Accession #:    [id number] Date of Birth: 11-20-1955          Patient Gender: M Patient Age:   56 years Exam Location:  [facility] Procedure:      VAS [place] LOWER EXTREMITY VENOUS (DVT) Referring Phys: Wandra Feinstein RATHORE --------------------------------------------------------------------------------  Indications: Elevated Ddimer.  Risk Factors: Cancer. Limitations: Patient positioning and body habitus. Comparison Study: No prior studies. Performing Technologist: Oliver Hum RVT  Examination Guidelines: A complete evaluation includes B-mode imaging, spectral Doppler, color Doppler, and power Doppler as needed of all accessible portions of each vessel. Bilateral testing is considered an integral part of a complete examination. Limited examinations for reoccurring indications may be performed as noted. The reflux portion of the exam is performed with the patient in reverse Trendelenburg.  +---------+---------------+---------+-----------+----------+--------------+ RIGHT    CompressibilityPhasicitySpontaneityPropertiesThrombus Aging  +---------+---------------+---------+-----------+----------+--------------+ CFV      Full           Yes      Yes                                 +---------+---------------+---------+-----------+----------+--------------+ SFJ      Full                                                        +---------+---------------+---------+-----------+----------+--------------+ FV Prox  Full                                                        +---------+---------------+---------+-----------+----------+--------------+ FV Mid   Full                                                        +---------+---------------+---------+-----------+----------+--------------+  FV DistalFull                                                        +---------+---------------+---------+-----------+----------+--------------+ PFV      Full                                                        +---------+---------------+---------+-----------+----------+--------------+ POP      Full           Yes      Yes                                 +---------+---------------+---------+-----------+----------+--------------+ PTV      Full                                                        +---------+---------------+---------+-----------+----------+--------------+ PERO     Full                                                        +---------+---------------+---------+-----------+----------+--------------+   +---------+---------------+---------+-----------+----------+--------------+ LEFT     CompressibilityPhasicitySpontaneityPropertiesThrombus Aging +---------+---------------+---------+-----------+----------+--------------+ CFV      Full           Yes      Yes                                 +---------+---------------+---------+-----------+----------+--------------+ SFJ      Full                                                         +---------+---------------+---------+-----------+----------+--------------+ FV Prox  Full                                                        +---------+---------------+---------+-----------+----------+--------------+ FV Mid   Full                                                        +---------+---------------+---------+-----------+----------+--------------+ FV DistalFull                                                        +---------+---------------+---------+-----------+----------+--------------+  PFV      Full                                                        +---------+---------------+---------+-----------+----------+--------------+ POP      Full           Yes      Yes                                 +---------+---------------+---------+-----------+----------+--------------+ PTV      Full                                                        +---------+---------------+---------+-----------+----------+--------------+ PERO     Full                                                        +---------+---------------+---------+-----------+----------+--------------+     Summary: RIGHT: - There is no evidence of deep vein thrombosis in the lower extremity.  - No cystic structure found in the popliteal fossa.  LEFT: - There is no evidence of deep vein thrombosis in the lower extremity.  - No cystic structure found in the popliteal fossa.  *See table(s) above for measurements and observations. Electronically signed by Servando Snare MD on 04/02/2021 at 4:26:00 PM.    Final     Assessment/Plan  1. Oropharyngeal dysphagia -  unable to tolerate mechanical soft diet -  will change to pured diet -   continue speech therapy  2. 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  Labs/tests ordered:    CBC with differentials and BMP  Goals of care:   Short-term care   Durenda Age, DNP, MSN, FNP-BC [facility] [phone] (Monday-Friday 8:00 a.m. - 5:00 p.m.) [phone] (after hours)

## 2021-04-21 ENCOUNTER — Ambulatory Visit
Admission: RE | Admit: 2021-04-21 | Discharge: 2021-04-21 | Disposition: A | Discharge status: Home / Self Care | Payer: Medicare PPO | Source: Ambulatory Visit | Attending: Radiation Oncology | Admitting: Radiation Oncology

## 2021-04-21 ENCOUNTER — Encounter: Payer: Self-pay | Admitting: *Deleted

## 2021-04-21 ENCOUNTER — Encounter: Payer: Self-pay | Admitting: Radiation Oncology

## 2021-04-21 VITALS — BP 119/65 | HR 90 | Temp 97.8°F | Resp 20

## 2021-04-21 DIAGNOSIS — C7951 Secondary malignant neoplasm of bone: Secondary | ICD-10-CM | POA: Insufficient documentation

## 2021-04-21 DIAGNOSIS — C7800 Secondary malignant neoplasm of unspecified lung: Secondary | ICD-10-CM | POA: Insufficient documentation

## 2021-04-21 DIAGNOSIS — C760 Malignant neoplasm of head, face and neck: Secondary | ICD-10-CM | POA: Insufficient documentation

## 2021-04-21 DIAGNOSIS — D492 Neoplasm of unspecified behavior of bone, soft tissue, and skin: Secondary | ICD-10-CM

## 2021-04-21 LAB — BASIC METABOLIC PANEL
BUN: 32 — AB (ref 4–21)
CO2: 22 (ref 13–22)
Chloride: 100 (ref 99–108)
Creatinine: 0.5 — AB (ref 0.6–1.3)
Glucose: 56
Potassium: 3.7 (ref 3.4–5.3)
Sodium: 135 — AB (ref 137–147)

## 2021-04-21 LAB — COMPREHENSIVE METABOLIC PANEL
Calcium: 8.6 — AB (ref 8.7–10.7)
GFR calc Af Amer: >90
GFR calc non Af Amer: >90

## 2021-04-21 LAB — CBC AND DIFFERENTIAL
HCT: 37 — AB (ref 41–53)
Hemoglobin: 12.4 — AB (ref 13.5–17.5)
Platelets: 221 (ref 150–399)
WBC: 11.2

## 2021-04-21 LAB — CBC: RBC: 4.1 (ref 3.87–5.11)

## 2021-04-21 MED ORDER — SODIUM CHLORIDE 0.9% FLUSH
10.0000 mL | Freq: Once | INTRAVENOUS | Status: AC
  Administered 2021-04-21: 10 mL via INTRAVENOUS

## 2021-04-21 NOTE — Progress Notes (Signed)
Oncology Nurse Navigator Documentation   Dr. Isidore Moos requested I contact Mr. Nestler's medical oncologist at [facility] who previously treated him for his head and neck cancer. Mr. Gang would like a reconsultation to discuss his recently diagnosed metastatic disease. I called Dr. Sande Rives Ready's (Medical Oncology at [facility]) and spoke with Clara and she requested I sent recent office notes, pathology, and imaging notes for Dr. Ready to review. She will contact me after Dr. Georgeanna Lea has reviewed the information and set up an appointment as needed.   Harlow Asa RN, BSN, OCN Head & Neck Oncology Nurse [place] at [facility] Phone # [phone]  Fax # [phone]

## 2021-04-21 NOTE — Progress Notes (Addendum)
Has armband been applied?  Yes.    Does patient have an allergy to IV contrast dye?: No.   Has patient ever received premedication for IV contrast dye?: No.   Does patient take metformin?: No.  Date of lab work: April 04, 2021 BUN: 17   CR: 0.69 eGFR: >60  IV site: forearm left, condition patent and no redness  Has IV site been added to flowsheet?  Yes.    BP 119/65 (BP Location: Left Arm, Patient Position: Sitting)   Pulse 90   Temp 97.8 F (36.6 C) (Temporal)   Resp 20   SpO2 99%

## 2021-04-21 NOTE — Progress Notes (Signed)
Had a phone conversation with patient's brother, Roderic Palau, updating him on his brother's care.  He appreciated the call and has our departmental contact info if more questions arise. -----------------------------------  Eppie Gibson, MD

## 2021-04-21 NOTE — Progress Notes (Signed)
[place] CSW Psychosocial Distress Screening   Social Work was referred by distress screening protocol. The patient scored an 8 on the Psychosocial Distress Thermometer which indicates severe distress. Social Work Theatre manager contacted Mr. Salah by phone to assess for distress and other psychosocial needs.    ONCBCN DISTRESS SCREENING 04/20/2021  Screening Type Initial Screening  Distress experienced in past week (1-10) 8  Emotional problem type Nervousness/Anxiety  Physical Problem type Pain;Getting around  Physician notified of physical symptoms Yes  Referral to clinical psychology No  Referral to clinical social work Yes  Referral to dietition No  Referral to financial advocate No  Referral to support programs Yes  Referral to palliative care No    Intern provided empathic listening and normalized feelings related to dealing with multiple major health issues at once, and environmental stressors in the nursing home where he lives. Call ended abruptly because patient had to begin physical therapy session.  SW Intern will make a referral to Lorrin Jackson (Spiritual care) and Gwinda Maine (Social work) to follow up with patient once he has a treatment plan in place, since intern will be out of office until the week of 05/31/21.   Rosary Lively, Social Work Intern Supervised by Gwinda Maine, LCSW

## 2021-04-23 LAB — CBC AND DIFFERENTIAL
HCT: 39 — AB (ref 41–53)
Hemoglobin: 13.3 — AB (ref 13.5–17.5)
Platelets: 214 (ref 150–399)
WBC: 9.8

## 2021-04-23 LAB — COMPREHENSIVE METABOLIC PANEL
Calcium: 9 (ref 8.7–10.7)
GFR calc Af Amer: >90
GFR calc non Af Amer: >90

## 2021-04-23 LAB — BASIC METABOLIC PANEL
BUN: 28 — AB (ref 4–21)
CO2: 28 — AB (ref 13–22)
Chloride: 96 — AB (ref 99–108)
Creatinine: 0.6 (ref 0.6–1.3)
Glucose: 117
Potassium: 4.2 (ref 3.4–5.3)
Sodium: 135 — AB (ref 137–147)

## 2021-04-23 LAB — CBC: RBC: 4.34 (ref 3.87–5.11)

## 2021-04-26 ENCOUNTER — Encounter: Payer: Self-pay | Admitting: *Deleted

## 2021-04-26 NOTE — Progress Notes (Signed)
Scheduling message sent to schedule OV with Dr. [name] 12/15,12/16 or 12/19. RT starting 12/8. Noted to call patient and his brother with the appointment.

## 2021-04-27 ENCOUNTER — Encounter: Payer: Self-pay | Admitting: Adult Health

## 2021-04-27 ENCOUNTER — Encounter: Payer: Self-pay | Admitting: *Deleted

## 2021-04-27 ENCOUNTER — Telehealth: Payer: Self-pay | Admitting: Oncology

## 2021-04-27 ENCOUNTER — Encounter: Payer: Self-pay | Admitting: General Practice

## 2021-04-27 ENCOUNTER — Non-Acute Institutional Stay (SKILLED_NURSING_FACILITY): Payer: Medicare PPO | Admitting: Adult Health

## 2021-04-27 DIAGNOSIS — E43 Unspecified severe protein-calorie malnutrition: Secondary | ICD-10-CM

## 2021-04-27 DIAGNOSIS — R1312 Dysphagia, oropharyngeal phase: Secondary | ICD-10-CM | POA: Diagnosis not present

## 2021-04-27 DIAGNOSIS — E039 Hypothyroidism, unspecified: Secondary | ICD-10-CM | POA: Diagnosis not present

## 2021-04-27 DIAGNOSIS — G9520 Unspecified cord compression: Secondary | ICD-10-CM | POA: Diagnosis not present

## 2021-04-27 NOTE — Progress Notes (Signed)
[place] Spiritual Care Note  Referred by Nigel Mormon Work Intern for follow-up support. Mr Waide was very welcoming of conversation partner and shared readily about his health and work history. 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 We plan to follow up by phone in ca two weeks.   [place], [place], [facility] Pager [phone] Voicemail [phone]

## 2021-04-27 NOTE — Progress Notes (Signed)
Location:  [place] Room Number: [place] of Service:  SNF (31) Provider:  Durenda Age, DNP, FNP-BC  Patient Care Team: Patient, [name] as PCP - General (General Practice)  Extended Emergency Contact Information Primary Emergency Contact: [place], [place] Mobile Phone: [phone] Relation: Brother Secondary Emergency Contact: [place] Phone: [phone] Relation: Mother  Code Status:  DNR  Goals of care: Advanced Directive information Advanced Directives 04/27/2021  Does Patient Have a Medical Advance Directive? Yes  Type of Paramedic of [place];Living will  Does patient want to make changes to medical advance directive? No - Patient declined  Copy of [place] in Chart? Yes - validated most recent copy scanned in chart (See row information)     Chief Complaint  Patient presents with   Acute Visit    Acute visit for short term rehabilitation.    HPI:  Bradley Oliver is a 65 y.o. male seen today for an acute visit.  He is a short-term care resident of [facility]. He takes Synthroid 50 mcg 1 tab daily for hypothyroidism.  He is currently taking.  8 diet with thin liquids due to dysphagia.  No reported aspirations.  Latest weight 109.8 lbs, gained 3.8 lbs in 3 weeks.   Past Medical History:  Diagnosis Date   Blood in stool    Cancer ([place]) 08/05/14   right tonsil, P16 positive   Past Surgical History:  Procedure Laterality Date   EYE SURGERY     KNEE SURGERY     LUMBAR PERCUTANEOUS PEDICLE SCREW 4 LEVEL N/A 04/02/2021   Procedure: THORACIC THREE-THORACIC SEVEN, INSTRUMENTATION AND FUSION, THORACIC FOUR-THORACIC SIX LAMINECTOMY, THORACIC FIVE TRANSPEDICULAR DECOMPRESSION;  Surgeon: Dawley, Theodoro Doing, DO;  Location: [place];  Service: Neurosurgery;  Laterality: N/A;   tonsil biopsy Right 08/05/14   invasive squamous cell carcinoma     No Known Allergies  Outpatient Encounter Medications as of 04/27/2021  Medication Sig   bisacodyl (DULCOLAX) 10 MG suppository Place 10 mg rectally as needed for moderate constipation.   dexamethasone (DECADRON) 2 MG tablet Take 2 mg by mouth 2 (two) times daily.   Ensure Plus (ENSURE PLUS) LIQD Take 237 mLs by mouth in the morning, at noon, and at bedtime.   levothyroxine (SYNTHROID) 50 MCG tablet Take 50 mcg by mouth daily.   lidocaine (LIDODERM) 5 % Place 1 patch onto the skin daily as needed (pain).   Magnesium Hydroxide (MILK OF MAGNESIA PO) Take 30 mLs by mouth as needed.   Multiple Vitamins-Minerals (MULTIVITAMIN ADULT) TABS Take 1 tablet by mouth daily. with Omega 3   polyethylene glycol (MIRALAX / GLYCOLAX) 17 g packet Take 17 g by mouth daily as needed for mild constipation.   Sodium Phosphates (RA SALINE ENEMA RE) Place 1 Dose rectally as needed.   [DISCONTINUED] docusate (COLACE) 50 MG/5ML liquid Take 10 mLs (100 mg total) by mouth 2 (two) times daily.   No facility-administered encounter medications on file as of 04/27/2021.    Review of Systems  GENERAL: No change in appetite, no fatigue, no weight changes, no fever or chills  MOUTH and THROAT: Denies oral discomfort, gingival pain or bleeding RESPIRATORY: no cough, SOB, DOE, wheezing, hemoptysis CARDIAC: No chest pain, edema or palpitations GI: No abdominal pain, diarrhea, constipation, heart burn, nausea or vomiting GU: Denies dysuria, frequency, hematuria, incontinence, or discharge NEUROLOGICAL: Denies dizziness, syncope, numbness, or headache PSYCHIATRIC: Denies feelings of  depression or anxiety. No report of hallucinations, insomnia, paranoia, or agitation    Immunization History  Administered Date(s) Administered   Janssen (J&J) SARS-COV-2 Vaccination 08/31/2019   PPD Test 09/14/2017   Unspecified SARS-COV-2 Vaccination 09/21/2019   Pertinent  Health Maintenance Due  Topic Date Due   COLONOSCOPY (Pts  45-53yrs Insurance coverage will need to be confirmed)  Never done   INFLUENZA VACCINE  Never done   Fall Risk 04/07/2021 04/08/2021 04/08/2021 04/09/2021 04/20/2021  Patient Fall Risk Level High fall risk High fall risk High fall risk High fall risk High fall risk     Vitals:   04/27/21 1050  BP: 110/73  Pulse: 81  Resp: 18  Temp: (!) 97.2 F (36.2 C)  Weight: 109 lb 12.8 oz (49.8 kg)  Height: 5\' 10"  (1.778 m)   Body mass index is 15.75 kg/m.  Physical Exam  GENERAL APPEARANCE:  In no acute distress. Cachectic SKIN:  Skin is warm and dry.  MOUTH and THROAT: Lips are without lesions. Oral mucosa is moist and without lesions.  RESPIRATORY: Breathing is even & unlabored, BS CTAB CARDIAC: RRR, no murmur,no extra heart sounds, no edema GI: Abdomen soft, normal BS, no masses, no tenderness NEUROLOGICAL: There is no tremor. Speech is clear. Alert and oriented X 3. PSYCHIATRIC:  Affect and behavior are appropriate  Labs reviewed: Recent Labs    09/11/20 0000 04/01/21 1912 04/02/21 2341 04/04/21 0340 04/14/21 0000 04/21/21 0000 04/23/21 0000  NA 141 137   < > 133* 135* 135* 135*  K 4.7 3.4*   < > 3.8 4.4 3.7 4.2  CL 105 101  --  101 102 100 96*  CO2 25 27  --  25 23* 22 28*  GLUCOSE 93 97  --  148*  --   --   --   BUN 19 20  --  17 49* 32* 28*  CREATININE 0.83 0.72   < > 0.69 0.6 0.5* 0.6  CALCIUM 9.3 9.3  --  8.5* 8.3* 8.6* 9.0   < > = values in this interval not displayed.   Recent Labs    09/11/20 0000 04/01/21 1912 04/04/21 0340  AST 17 27 31   ALT 6* 11 11  ALKPHOS  --  82 58  BILITOT 0.6 1.2 0.8  PROT 6.1 7.2 5.2*  ALBUMIN  --  4.2 3.0*   Recent Labs    04/01/21 1912 04/02/21 2341 04/03/21 0225 04/04/21 0340 04/14/21 0000 04/21/21 0000 04/23/21 0000  WBC 6.6  --  10.6* 11.7* 18.9 11.2 9.8  NEUTROABS 5.4  --   --   --   --   --   --   HGB 14.0   < > 12.8* 12.2* 12.8* 12.4* 13.3*  HCT 40.9   < > 37.1* 35.8* 37* 37* 39*  MCV 88.1  --  86.7 85.6   --   --   --   PLT 274  --  205 224 224 221 214   < > = values in this interval not displayed.   Lab Results  Component Value Date   TSH 1.47 09/11/2020   No results found for: HGBA1C Lab Results  Component Value Date   CHOL 164 09/11/2020   HDL 48 09/11/2020   LDLCALC 95 09/11/2020   TRIG 118 09/11/2020   CHOLHDL 3.4 09/11/2020    Significant Diagnostic Results in last 30 days:  No results found.  Assessment/Plan  1. Acquired hypothyroidism Lab Results  Component Value Date  TSH 1.47 09/11/2020   -   Continue Synthroid  2. Oropharyngeal dysphagia -   Continue pure diet with thin liquids -   Aspiration precautions  3. Protein-calorie malnutrition, severe Wt Readings from Last 3 Encounters:  04/27/21 109 lb 12.8 oz (49.8 kg)  04/20/21 107 lb 12.8 oz (48.9 kg)  04/12/21 106 lb (48.1 kg)   -Continue supplementations with Ensure, multivitamins and Magic cup  4. Cord compression ([place]) -  S/P decompression -   Continue dexamethasone -    Continue Bradley Oliver and OT, for therapeutic strengthening exercises    Family/ staff Communication: Discussed plan of care with resident and charge nurse.  Labs/tests ordered: None  Goals of care:   Short-term care   Durenda Age, DNP, MSN, FNP-BC [facility] [phone] (Monday-Friday 8:00 a.m. - 5:00 p.m.) [phone] (after hours)

## 2021-04-27 NOTE — Telephone Encounter (Signed)
Scheduled appointment per 12/05 inbasket. Patient aware.

## 2021-04-27 NOTE — Progress Notes (Signed)
Foundation One testing requested from November 2022 surgical pathology Accession # [id number] per Dr [name]. Also patient aware of appt on December 16 with Dr [name].

## 2021-04-28 ENCOUNTER — Encounter: Payer: Self-pay | Admitting: Oncology

## 2021-04-28 DIAGNOSIS — G9520 Unspecified cord compression: Secondary | ICD-10-CM | POA: Diagnosis not present

## 2021-04-28 DIAGNOSIS — C760 Malignant neoplasm of head, face and neck: Secondary | ICD-10-CM | POA: Insufficient documentation

## 2021-04-28 DIAGNOSIS — C7800 Secondary malignant neoplasm of unspecified lung: Secondary | ICD-10-CM | POA: Insufficient documentation

## 2021-04-28 DIAGNOSIS — C7951 Secondary malignant neoplasm of bone: Secondary | ICD-10-CM | POA: Insufficient documentation

## 2021-04-28 DIAGNOSIS — C01 Malignant neoplasm of base of tongue: Secondary | ICD-10-CM | POA: Diagnosis not present

## 2021-04-29 ENCOUNTER — Ambulatory Visit
Admission: RE | Admit: 2021-04-29 | Discharge: 2021-04-29 | Disposition: A | Discharge status: Home / Self Care | Payer: Medicare PPO | Source: Ambulatory Visit | Attending: Radiation Oncology | Admitting: Radiation Oncology

## 2021-04-29 ENCOUNTER — Other Ambulatory Visit: Payer: Self-pay

## 2021-04-29 ENCOUNTER — Inpatient Hospital Stay: Payer: Medicare PPO | Attending: Internal Medicine | Admitting: Internal Medicine

## 2021-04-29 DIAGNOSIS — G9520 Unspecified cord compression: Secondary | ICD-10-CM | POA: Insufficient documentation

## 2021-04-29 DIAGNOSIS — C01 Malignant neoplasm of base of tongue: Secondary | ICD-10-CM | POA: Diagnosis not present

## 2021-04-29 DIAGNOSIS — C7951 Secondary malignant neoplasm of bone: Secondary | ICD-10-CM | POA: Insufficient documentation

## 2021-04-29 DIAGNOSIS — C760 Malignant neoplasm of head, face and neck: Secondary | ICD-10-CM | POA: Insufficient documentation

## 2021-04-29 DIAGNOSIS — C7800 Secondary malignant neoplasm of unspecified lung: Secondary | ICD-10-CM | POA: Insufficient documentation

## 2021-04-29 NOTE — Progress Notes (Signed)
[facility] [place], [place] [address] [phone]   New Patient Evaluation  Date of Service: 04/29/21 Patient Name: Bradley Oliver Patient MRN: [id number] Patient DOB: 1956/03/14 Provider: Ventura Sellers, MD  Identifying Statement:  Bradley Oliver is a 65 y.o. male with Cord compression [facility]) who presents for initial consultation and evaluation regarding cancer associated neurologic deficits.     Primary Cancer: Squamous H+N  CNS Oncologic History: 04/02/21: Laminectomy, with tumor resection of epidural components of large left paravertebral mass; instrumentation, T4-7 fusion with Dr. Reatha Armour 04/29/21: Begins post-operative radiation therapy [facility])   History of Present Illness: The patient's records from the referring physician were obtained and reviewed and the patient interviewed to confirm this HPI.  Bradley Oliver presents today for evaluation as he begins post-operative radiation for large epidural mass.  He continues to get stronger with rehab services at his facility; currently able to stand and "hold on with the walker", but unable to walk on his own at this point. He is starting to get some feeling back in his legs as well, though they are both still numb.  Has had ongoing issues with diet, PO intake, constipation and impaction due to cancer and immobility.  No plan yet for systemic therapy with Dr. [name] and other oncologists in consultation.  Medications: Current Outpatient Medications on File Prior to Visit  Medication Sig Dispense Refill   bisacodyl (DULCOLAX) 10 MG suppository Place 10 mg rectally as needed for moderate constipation.     dexamethasone (DECADRON) 2 MG tablet Take 2 mg by mouth 2 (two) times daily.     Ensure Plus (ENSURE PLUS) LIQD Take 237 mLs by mouth in the morning, at noon, and at bedtime.     levothyroxine (SYNTHROID) 50 MCG tablet Take 50 mcg by mouth daily.     lidocaine  (LIDODERM) 5 % Place 1 patch onto the skin daily as needed (pain).     Magnesium Hydroxide (MILK OF MAGNESIA PO) Take 30 mLs by mouth as needed.     Multiple Vitamins-Minerals (MULTIVITAMIN ADULT) TABS Take 1 tablet by mouth daily. with Omega 3     polyethylene glycol (MIRALAX / GLYCOLAX) 17 g packet Take 17 g by mouth daily as needed for mild constipation. 14 each 0   Sodium Phosphates (RA SALINE ENEMA RE) Place 1 Dose rectally as needed.     No current facility-administered medications on file prior to visit.    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   Social History:  Social History   Socioeconomic History   Marital status: Single    Spouse name: Not on file   Number of children: Not on file   Years of education: 17   Highest education level: Master's degree (e.g., MA, MS, MEng, MEd, MSW, MBA)  Occupational History   Not on  file  Tobacco Use   Smoking status: Former   Smokeless tobacco: Never   Tobacco comments:    smoked for short time as teenager  Substance and Sexual Activity   Alcohol use: Not Currently   Drug use: Never   Sexual activity: Not Currently  Other Topics Concern   Not on file  Social History Narrative   Not on file   Social Determinants of Health   Financial Resource Strain: Not on file  Food Insecurity: Not on file  Transportation Needs: Not on file   Physical Activity: Not on file  Stress: Not on file  Social Connections: Not on file  Intimate Partner Violence: Not on file   Family History:  Family History  Problem Relation Age of Onset   Hypertension Mother    Diabetes Father    Diabetes Paternal Grandmother    Diabetes Paternal Grandfather     Review of Systems: Constitutional: Doesn't report fevers, chills or abnormal weight loss Eyes: Doesn't report blurriness of vision Ears, nose, mouth, throat, and face: Doesn't report sore throat Respiratory: Doesn't report cough, dyspnea or wheezes Cardiovascular: Doesn't report palpitation, chest discomfort  Gastrointestinal:  Doesn't report nausea, constipation, diarrhea GU: Doesn't report incontinence Skin: Doesn't report skin rashes Neurological: Per HPI Musculoskeletal: Doesn't report joint pain Behavioral/Psych: Doesn't report anxiety  Physical Exam: Wt Readings from Last 3 Encounters:  04/27/21 109 lb 12.8 oz (49.8 kg)  04/20/21 107 lb 12.8 oz (48.9 kg)  04/12/21 106 lb (48.1 kg)   Temp Readings from Last 3 Encounters:  04/27/21 (!) 97.2 F (36.2 C)  04/21/21 97.8 F (36.6 C)  04/21/21 97.8 F (36.6 C) (Temporal)   BP Readings from Last 3 Encounters:  04/27/21 (!) 99/57  04/21/21 119/65  04/21/21 119/65   Pulse Readings from Last 3 Encounters:  04/27/21 81  04/21/21 90  04/21/21 90   KPS: 60. General: Thin appearing Head: Normal EENT: No conjunctival injection or scleral icterus.  Lungs: Resp effort normal Cardiac: Regular rate Abdomen: Non-distended abdomen Skin: No rashes cyanosis or petechiae. Extremities: No clubbing or edema  Neurologic Exam: Mental Status: Awake, alert, attentive to examiner. Oriented to self and environment. Language is fluent with intact comprehension. At times disorganized and tangential speech. Cranial Nerves: Visual acuity is grossly normal. Visual fields are full. Extra-ocular movements intact. No ptosis. Face is  symmetric Motor: Bulk is below normal. Power is 4/5 in both legs. Reflexes are increased at patella b/l, no pathologic reflexes present.  Sensory: impaired midthoracic and below, light touch and temp Gait: non ambulatory   Labs: I have reviewed the data as listed    Component Value Date/Time   NA 135 (A) 04/23/2021 0000   K 4.2 04/23/2021 0000   CL 96 (A) 04/23/2021 0000   CO2 28 (A) 04/23/2021 0000   GLUCOSE 148 (H) 04/04/2021 0340   BUN 28 (A) 04/23/2021 0000   CREATININE 0.6 04/23/2021 0000   CREATININE 0.69 04/04/2021 0340   CREATININE 0.83 09/11/2020 0000   CALCIUM 9.0 04/23/2021 0000   PROT 5.2 (L) 04/04/2021 0340   ALBUMIN 3.0 (L) 04/04/2021 0340   AST 31 04/04/2021 0340   ALT 11 04/04/2021 0340   ALKPHOS 58 04/04/2021 0340   BILITOT 0.8 04/04/2021 0340   GFRNONAA >90 04/23/2021 0000   GFRNONAA >60 04/04/2021 0340   GFRNONAA 94 09/11/2020 0000   GFRAA >90 04/23/2021 0000   GFRAA 109 09/11/2020 0000   Lab Results  Component Value Date   WBC 9.8  04/23/2021   NEUTROABS 5.4 04/01/2021   HGB 13.3 (A) 04/23/2021   HCT 39 (A) 04/23/2021   MCV 85.6 04/04/2021   PLT 214 04/23/2021    Imaging:  CT Angio Chest PE W and/or Wo Contrast  Result Date: 04/02/2021 CLINICAL DATA:  Weakness, T4-6 tumor, history of right tonsillar cancer, elevated D-dimer, evaluate for PE EXAM: CT ANGIOGRAPHY CHEST WITH CONTRAST TECHNIQUE: Multidetector CT imaging of the chest was performed using the standard protocol during bolus administration of intravenous contrast. Multiplanar CT image reconstructions and MIPs were obtained to evaluate the vascular anatomy. CONTRAST:  66mL OMNIPAQUE IOHEXOL 350 MG/ML SOLN COMPARISON:  None. FINDINGS: Cardiovascular: Satisfactory opacification of the bilateral pulmonary arteries to the lobar level. Narrowing of the left main pulmonary artery due to tumor. No evidence of pulmonary embolism. Although not tailored for evaluation of the thoracic aorta, there is no  evidence thoracic aortic aneurysm. Mild atherosclerotic calcifications. The heart is normal in size.  No pericardial effusion. Mediastinum/Nodes: Tumor along the anterior mediastinum/AP window and extending to the medial lung. This will be described below. Visualized thyroid is unremarkable. Lungs/Pleura: Mass involving the medial aspect of the left upper lobe and superior segment left lower lobe, extending to the mediastinum, measuring 14.1 x 4.7 cm (series 6/image 93). Moderate left pleural effusion, partially loculated, with associated apical capping. Associated left lower lobe opacity, likely compressive atelectasis. Trace right pleural effusion. 9 mm nodule in the superior segment right lower lobe (series 7/image 36). No pneumothorax. Upper Abdomen: Visualized upper abdomen is grossly unremarkable. Musculoskeletal: Moderate compression fracture at T5, pathologic. Total spine MRI has been performed and further evaluation will be deferred to that study. Otherwise, no focal osseous lesions. Review of the MIP images confirms the above findings. IMPRESSION: No evidence of pulmonary embolism. 14.1 x 4.7 cm mass involving the medial left lung and extending to the mediastinum, as above. Moderate left pleural effusion, partially loculated, with apical capping. Trace right pleural effusion. Moderate pathologic compression fracture at T5. Additional spinal involvement will be described on dedicated total spine MR, reported separately. Aortic Atherosclerosis (ICD10-I70.0). Electronically Signed   By: Julian Hy M.D.   On: 04/02/2021 01:31   MR Cervical Spine W or Wo Contrast  Result Date: 04/02/2021 CLINICAL DATA:  Initial evaluation for metastatic disease evaluation. EXAM: MRI CERVICAL SPINE WITHOUT AND WITH CONTRAST TECHNIQUE: Multiplanar and multiecho pulse sequences of the cervical spine, to include the craniocervical junction and cervicothoracic junction, were obtained without and with intravenous  contrast. CONTRAST:  5.67mL GADAVIST GADOBUTROL 1 MMOL/ML IV SOLN COMPARISON:  None. FINDINGS: Alignment: Physiologic with preservation of the normal cervical lordosis. No listhesis. Vertebrae: Vertebral body height maintained without acute or chronic fracture. Bone marrow signal intensity within normal limits. No discrete osseous lesions or evidence for metastatic disease. No abnormal edema or enhancement. Cord: Normal signal morphology. Posterior Fossa, vertebral arteries, paraspinal tissues: Visualized brain and posterior fossa within normal limits. Craniocervical junction normal. Paraspinous soft tissues within normal limits. Normal flow voids seen within the vertebral arteries bilaterally. Disc levels: C2-C3: Right paracentral disc protrusion indents the right ventral thecal sac. No significant spinal stenosis or cord impingement. Foramina remain patent. C3-C4: Right paracentral disc protrusion indents the right ventral thecal sac. No significant spinal stenosis or cord impingement. Foramina remain patent. C4-C5: Mild degenerative intervertebral disc space narrowing. Superimposed right paracentral disc protrusion indents the ventral thecal sac, contacting and minimally flattening the ventral cord. Thecal sac remains widely patent without significant spinal stenosis. Bilateral uncovertebral spurring with  resultant mild right C5 foraminal stenosis. Left neural foramen remains patent. C5-C6: Mild degenerative intervertebral disc space narrowing. Right eccentric disc bulge with associated right greater than left uncovertebral spurring. Broad posterior disc osteophyte mildly flattens the ventral thecal sac without significant spinal stenosis. Moderate right C6 foraminal narrowing. Left neural foramina remains patent. C6-C7: Mild degenerative intervertebral disc space narrowing. Right eccentric disc osteophyte complex mildly flattens the ventral thecal sac without significant spinal stenosis. Mild to moderate right C7  foraminal narrowing. Left neural foramen remains patent. C7-T1: Minimal disc bulge. Mild facet hypertrophy. No spinal stenosis. Foramina remain patent. IMPRESSION: 1. No evidence for metastatic disease within the cervical spine. 2. Small right paracentral disc protrusions at C2-3 through C4-5 without significant spinal stenosis. 3. Right eccentric disc osteophyte complexes at C5-6 and C6-7 with resultant mild to moderate right C6 and C7 foraminal stenosis as above. Electronically Signed   By: Jeannine Boga M.D.   On: 04/02/2021 01:05   MR THORACIC SPINE W WO CONTRAST  Result Date: 04/02/2021 CLINICAL DATA:  Initial metastatic evaluation. EXAM: MRI THORACIC WITHOUT AND WITH CONTRAST TECHNIQUE: Multiplanar and multiecho pulse sequences of the thoracic spine were obtained without and with intravenous contrast. CONTRAST:  5.74mL GADAVIST GADOBUTROL 1 MMOL/ML IV SOLN COMPARISON:  None available. FINDINGS: Alignment: Dextroscoliosis. Focal kyphotic angulation of the thoracic spine at the level of T5. No listhesis. Vertebrae: Large contiguous infiltrative and destructive metastatic deposit seen involving the upper-midthoracic spine. Lesion involves the T4 through T7 vertebral bodies, and is centered at the left paraspinous region. Although measurements are somewhat difficult, lesion measures approximately 7.7 x 8.4 x 10.9 cm in greatest dimensions (AP by transverse by craniocaudad). Lesion involves the left hilar/perihilar region, tracking posteriorly along the posteromedial left lung, involving the adjacent left mediastinum and partially encasing the aorta. Direct extension of this mass into the adjacent posterior ribs and T4 through T7 vertebral bodies, with involvement of their posterior elements at several levels. There is an associated pathologic fracture of T5 with approximately 60% height loss. Epidural extension of tumor to involve the spinal canal, extending from T3 through T7. This is most pronounced  at the level of T5 where there is circumferential epidural involvement. Associated severe spinal stenosis at the level of T5 with cord compression and associated cord signal changes (series 17, image 8). Thecal sac measures 4 mm in AP diameter at its most narrow point. Tumor also extends to involve the left neural foramina at T4-5 through T6-7, with probable early extension into the left T3-4 and T7-8 foramina. Involvement of the right T4-5 and T6-7 neural foramina noted as well. There is an additional 1 cm T1 hypointense enhancing lesion within the T11 vertebral body, indeterminate, but could reflect an additional metastatic lesion (series 19, image 7). Otherwise, no other metastatic disease seen within the thoracic spine. Multiple scattered benign hemangiomata noted. No other acute or chronic fracture. Cord: Epidural tumor with associated severe spinal stenosis and cord signal changes at T4-5 through T6-7 as above, most pronounced at the T5 level. Otherwise, signal intensity within the thoracic cord is within normal limits. No other epidural involvement. Paraspinal and other soft tissues: Metastatic lesion involving the left paraspinous and posteromedial left lung at T4 through T7 as above. Associated atelectasis with irregular and partially loculated left pleural effusion partially visualized. Direct extension of tumor to involve the left posterior soft tissues of the left mid back noted as well. Disc levels: No significant underlying disc pathology for age. No other significant stenosis  or impingement. IMPRESSION: 1. Large destructive and infiltrative metastatic deposit centered at the left paraspinous region, with involvement of the T4 through T7 vertebral bodies as above. Associated epidural extension with severe spinal stenosis, cord compression, and cord signal changes at the levels of T4-5 through T6-7. 2. Metastatic lesion extends to involve the adjacent posteromedial aspect of the left lung as well as the  left adjacent mediastinum and left hilar/perihilar region, also seen on corresponding CTA of the chest. 3. Associated pathologic fracture of T5 with up to 60% height loss. 4. 1 cm enhancing lesion within the T11 vertebral body, indeterminate, but could reflect an additional metastatic lesion. Attention at follow-up recommended. Critical Value/emergent results were called by telephone at the time of interpretation on 04/02/2021 at 1:32 am to provider Dr. Florina Ou , who verbally acknowledged these results. Electronically Signed   By: Jeannine Boga M.D.   On: 04/02/2021 01:41   MR Lumbar Spine W Wo Contrast  Result Date: 04/02/2021 CLINICAL DATA:  Initial evaluation for metastatic evaluation. EXAM: MRI LUMBAR SPINE WITHOUT AND WITH CONTRAST TECHNIQUE: Multiplanar and multiecho pulse sequences of the lumbar spine were obtained without and with intravenous contrast. CONTRAST:  5.63mL GADAVIST GADOBUTROL 1 MMOL/ML IV SOLN COMPARISON:  None available. FINDINGS: Segmentation: Standard. Lowest well-formed disc space labeled the L5-S1 level. Alignment: Physiologic with preservation of the normal lumbar lordosis. No listhesis. Vertebrae: Vertebral body height maintained without acute or chronic fracture. Prominent benign hemangioma noted within the T12 vertebral body. There is a 8 mm T1 hypointense enhancing lesion within the inferior aspect of the L5 vertebral body, nonspecific, but could reflect a small metastatic lesion (series 23, image 6). No other evidence for metastatic disease seen within the lumbar spine. Conus medullaris and cauda equina: Conus extends to the L1 level. Conus and cauda equina appear normal. Paraspinal and other soft tissues: Mild diffuse edema within the lower posterior paraspinous musculature, nonspecific, but could reflect muscular injury and/or strain. Soft tissues demonstrate no other acute finding. Prominent distension of the partially visualized urinary bladder. Visualized visceral  structures otherwise unremarkable. Disc levels: L1-2:  Negative interspace.  Mild facet hypertrophy.  No stenosis. L2-3:  Negative interspace.  Mild facet hypertrophy.  No stenosis. L3-4:  Negative interspace.  Mild facet hypertrophy.  No stenosis. L4-5: Disc desiccation with minimal disc bulge. Superimposed tiny central disc protrusion with annular fissure. Mild facet hypertrophy. Mild narrowing of the lateral recesses. Central canal remains patent. No foraminal stenosis. L5-S1: Degenerative intervertebral disc space narrowing with disc desiccation and mild diffuse disc bulge. Mild reactive endplate spurring. Mild facet hypertrophy. No significant spinal stenosis. Foramina remain patent. IMPRESSION: 1. 8 mm enhancing lesion within the inferior aspect of the L5 vertebral body, nonspecific, but could reflect a small metastatic lesion. Attention at follow-up recommended. 2. No other evidence for metastatic disease within the lumbar spine. 3. Mild diffuse edema within the lower posterior paraspinous musculature, nonspecific, but could reflect muscular injury and/or strain. 4. Prominent distension of the visualized urinary bladder. Query urinary retention. 5. Mild for age spondylosis without significant stenosis. Electronically Signed   By: Jeannine Boga M.D.   On: 04/02/2021 01:51   [place] CHEST (PLEURAL EFFUSION)  Result Date: 04/02/2021 CLINICAL DATA:  Pleural effusion. EXAM: CHEST ULTRASOUND COMPARISON:  CTA Chest earlier same day FINDINGS: Small left pleural effusion.  No discernible right effusion. IMPRESSION: Small left pleural effusion. Inadequate volume for safe image guided thoracentesis. Electronically Signed   By: Misty Stanley M.D.   On: 04/02/2021 12:23  DG Thoracic Spine 1 View  Result Date: 04/03/2021 CLINICAL DATA:  T3-7 fusion EXAM: OPERATIVE THORACIC SPINE 1 VIEW(S) COMPARISON:  None. FLUOROSCOPY TIME:  36 seconds FINDINGS: Intraoperative fluoroscopic image during T3-7 fusion with  pedicle screws. IMPRESSION: Intraoperative fluoroscopic images, as above. Electronically Signed   By: Julian Hy M.D.   On: 04/03/2021 02:56   DG Chest Portable 1 View  Result Date: 04/01/2021 CLINICAL DATA:  Chest tightness, tumor at T4-6 EXAM: PORTABLE CHEST 1 VIEW COMPARISON:  11/09/2017 FINDINGS: Left perihilar/mediastinal mass, progressive. Associated apical pleural thickening versus fluid, new. Right lung is clear.  No pneumothorax. The heart is normal in size. IMPRESSION: Left perihilar/mediastinal mass in this patient with known T4-6 tumor, progressive. Associated apical pleural thickening/versus fluid, new. Electronically Signed   By: Julian Hy M.D.   On: 04/01/2021 20:24   DG C-Arm 1-60 Min-No Report  Result Date: 04/03/2021 CLINICAL DATA:  T3-7 fusion EXAM: OPERATIVE THORACIC SPINE 1 VIEW(S) COMPARISON:  None. FLUOROSCOPY TIME:  36 seconds FINDINGS: Intraoperative fluoroscopic image during T3-7 fusion with pedicle screws. IMPRESSION: Intraoperative fluoroscopic images, as above. Electronically Signed   By: Julian Hy M.D.   On: 04/03/2021 02:56   DG C-Arm 1-60 Min-No Report  Result Date: 04/03/2021 CLINICAL DATA:  T3-7 fusion EXAM: OPERATIVE THORACIC SPINE 1 VIEW(S) COMPARISON:  None. FLUOROSCOPY TIME:  36 seconds FINDINGS: Intraoperative fluoroscopic image during T3-7 fusion with pedicle screws. IMPRESSION: Intraoperative fluoroscopic images, as above. Electronically Signed   By: Julian Hy M.D.   On: 04/03/2021 02:56   VAS [place] LOWER EXTREMITY VENOUS (DVT)  Result Date: 04/02/2021  Lower Venous DVT Study Patient Name:  Bradley Oliver  Date of Exam:   04/02/2021 Medical Rec #: [id number]          Accession #:    [id number] Date of Birth: Aug 23, 1955          Patient Gender: M Patient Age:   49 years Exam Location:  [facility] Procedure:      VAS [place] LOWER EXTREMITY VENOUS (DVT) Referring Phys: Wandra Feinstein RATHORE  --------------------------------------------------------------------------------  Indications: Elevated Ddimer.  Risk Factors: Cancer. Limitations: Patient positioning and body habitus. Comparison Study: No prior studies. Performing Technologist: [facility] Hum RVT  Examination Guidelines: A complete evaluation includes B-mode imaging, spectral Doppler, color Doppler, and power Doppler as needed of all accessible portions of each vessel. Bilateral testing is considered an integral part of a complete examination. Limited examinations for reoccurring indications may be performed as noted. The reflux portion of the exam is performed with the patient in reverse Trendelenburg.  +---------+---------------+---------+-----------+----------+--------------+ RIGHT    CompressibilityPhasicitySpontaneityPropertiesThrombus Aging +---------+---------------+---------+-----------+----------+--------------+ CFV      Full           Yes      Yes                                 +---------+---------------+---------+-----------+----------+--------------+ SFJ      Full                                                        +---------+---------------+---------+-----------+----------+--------------+ FV Prox  Full                                                        +---------+---------------+---------+-----------+----------+--------------+  FV Mid   Full                                                        +---------+---------------+---------+-----------+----------+--------------+ FV DistalFull                                                        +---------+---------------+---------+-----------+----------+--------------+ PFV      Full                                                        +---------+---------------+---------+-----------+----------+--------------+ POP      Full           Yes      Yes                                  +---------+---------------+---------+-----------+----------+--------------+ PTV      Full                                                        +---------+---------------+---------+-----------+----------+--------------+ PERO     Full                                                        +---------+---------------+---------+-----------+----------+--------------+   +---------+---------------+---------+-----------+----------+--------------+ LEFT     CompressibilityPhasicitySpontaneityPropertiesThrombus Aging +---------+---------------+---------+-----------+----------+--------------+ CFV      Full           Yes      Yes                                 +---------+---------------+---------+-----------+----------+--------------+ SFJ      Full                                                        +---------+---------------+---------+-----------+----------+--------------+ FV Prox  Full                                                        +---------+---------------+---------+-----------+----------+--------------+ FV Mid   Full                                                        +---------+---------------+---------+-----------+----------+--------------+  FV DistalFull                                                        +---------+---------------+---------+-----------+----------+--------------+ PFV      Full                                                        +---------+---------------+---------+-----------+----------+--------------+ POP      Full           Yes      Yes                                 +---------+---------------+---------+-----------+----------+--------------+ PTV      Full                                                        +---------+---------------+---------+-----------+----------+--------------+ PERO     Full                                                         +---------+---------------+---------+-----------+----------+--------------+     Summary: RIGHT: - There is no evidence of deep vein thrombosis in the lower extremity.  - No cystic structure found in the popliteal fossa.  LEFT: - There is no evidence of deep vein thrombosis in the lower extremity.  - No cystic structure found in the popliteal fossa.  *See table(s) above for measurements and observations. Electronically signed by Servando Snare MD on 04/02/2021 at 4:26:00 PM.    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    He is set to move forward with 10 sessions of conventional radiotherapy with Dr. Isidore Moos.  We are happy to see him moving forward for any neurological needs, myelopathy, weakness, sensory issues.  He will continue to work aggressively with PT and OT.  No treatment plan changes recommended at this time.  We spent twenty additional minutes teaching regarding the natural history, biology, and historical experience in the treatment of neurologic complications of cancer.   We appreciate the opportunity to participate in the care of Bradley Oliver.   All questions were answered. The patient knows to call the clinic with any problems, questions or concerns. No barriers to learning were detected.  The total time spent in the encounter was 40 minutes and more than 50% was on counseling and review of test results   Ventura Sellers, MD Medical Director of Neuro-Oncology [facility] [place] 04/29/21 2:28 PM

## 2021-04-30 ENCOUNTER — Ambulatory Visit
Admission: RE | Admit: 2021-04-30 | Discharge: 2021-04-30 | Disposition: A | Discharge status: Home / Self Care | Payer: Medicare PPO | Source: Ambulatory Visit | Attending: Radiation Oncology | Admitting: Radiation Oncology

## 2021-04-30 ENCOUNTER — Telehealth: Payer: Self-pay | Admitting: *Deleted

## 2021-04-30 ENCOUNTER — Encounter: Payer: Self-pay | Admitting: *Deleted

## 2021-04-30 DIAGNOSIS — C01 Malignant neoplasm of base of tongue: Secondary | ICD-10-CM | POA: Diagnosis not present

## 2021-04-30 NOTE — Telephone Encounter (Addendum)
Patient called and left VM requesting Dr. [name] make a referral for him to have a PCP in [place]. I need someone who will take time with me and can handle a well informed patient". Sent patient via [place] a website to locate a [place] primary care office near him to schedule closer to where he lives. Dr. [name] is not aware of any specific PCP to recommend.

## 2021-05-03 ENCOUNTER — Other Ambulatory Visit: Payer: Self-pay

## 2021-05-03 ENCOUNTER — Inpatient Hospital Stay (HOSPITAL_BASED_OUTPATIENT_CLINIC_OR_DEPARTMENT_OTHER): Payer: Medicare PPO | Admitting: Nurse Practitioner

## 2021-05-03 ENCOUNTER — Ambulatory Visit
Admission: RE | Admit: 2021-05-03 | Discharge: 2021-05-03 | Disposition: A | Discharge status: Home / Self Care | Payer: Medicare PPO | Source: Ambulatory Visit | Attending: Radiation Oncology | Admitting: Radiation Oncology

## 2021-05-03 DIAGNOSIS — C099 Malignant neoplasm of tonsil, unspecified: Secondary | ICD-10-CM

## 2021-05-03 DIAGNOSIS — Z515 Encounter for palliative care: Secondary | ICD-10-CM

## 2021-05-03 DIAGNOSIS — Z7189 Other specified counseling: Secondary | ICD-10-CM

## 2021-05-03 DIAGNOSIS — C7951 Secondary malignant neoplasm of bone: Secondary | ICD-10-CM

## 2021-05-03 DIAGNOSIS — R531 Weakness: Secondary | ICD-10-CM

## 2021-05-03 DIAGNOSIS — C01 Malignant neoplasm of base of tongue: Secondary | ICD-10-CM | POA: Diagnosis not present

## 2021-05-03 NOTE — Progress Notes (Signed)
[place]  Telephone:([phone] [phone]) [phone]   Name: Bradley Oliver. Printup Date: 05/03/2021 MRN: [id number]  DOB: 24-Aug-1955  Patient Care Team: Patient, [name] as PCP - General (General Practice)    REASON FOR CONSULTATION: Bradley Oliver is a 65 y.o. 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  Recent hospitalization and currently at [facility] facility for rehab. Palliative ask to see for symptom management and goals of care.    SOCIAL HISTORY:     reports that he has quit smoking. He has never used smokeless tobacco. He reports that he does not currently use alcohol. He reports that he does not use drugs.  ADVANCE DIRECTIVES:  Patient does have a completed advanced directive. His [name] is his HCPOA. MOST form completed today. Patient given DNR out-of-facility form.   CODE STATUS: DNR  PAST MEDICAL HISTORY: Past Medical History:  Diagnosis Date   Blood in stool    Cancer ([place]) 08/05/14   right tonsil, P16 positive    PAST SURGICAL HISTORY:  Past Surgical History:  Procedure Laterality Date   EYE SURGERY     KNEE SURGERY     LUMBAR PERCUTANEOUS PEDICLE SCREW 4 LEVEL N/A 04/02/2021   Procedure: THORACIC THREE-THORACIC SEVEN, INSTRUMENTATION AND FUSION, THORACIC FOUR-THORACIC SIX LAMINECTOMY, THORACIC FIVE TRANSPEDICULAR DECOMPRESSION;  Surgeon: Dawley, Theodoro Doing, DO;  Location: [facility];  Service: Neurosurgery;  Laterality: N/A;   tonsil biopsy Right 08/05/14   invasive squamous cell carcinoma    HEMATOLOGY/ONCOLOGY HISTORY:  Oncology History   No history exists.    ALLERGIES:  has No Known Allergies.  MEDICATIONS:  Current Outpatient Medications  Medication Sig Dispense Refill   bisacodyl (DULCOLAX) 10 MG suppository  Place 10 mg rectally as needed for moderate constipation.     dexamethasone (DECADRON) 2 MG tablet Take 2 mg by mouth 2 (two) times daily.     Ensure Plus (ENSURE PLUS) LIQD Take 237 mLs by mouth in the morning, at noon, and at bedtime.     levothyroxine (SYNTHROID) 50 MCG tablet Take 50 mcg by mouth daily.     lidocaine (LIDODERM) 5 % Place 1 patch onto the skin daily as needed (pain).     Magnesium Hydroxide (MILK OF MAGNESIA PO) Take 30 mLs by mouth as needed.     Multiple Vitamins-Minerals (MULTIVITAMIN ADULT) TABS Take 1 tablet by mouth daily. with Omega 3     polyethylene glycol (MIRALAX / GLYCOLAX) 17 g packet Take 17 g by mouth daily as needed for mild constipation. 14 each 0   Sodium Phosphates (RA SALINE ENEMA RE) Place 1 Dose rectally as needed.     No current facility-administered medications for this visit.    VITAL SIGNS: There were no vitals taken for this visit. There were no vitals filed for this visit.  Estimated body mass index is 15.75 kg/m as calculated from the following:   Height as of 04/27/21: 5' 10"  (1.778 m).   Weight as of 04/27/21: 109 lb 12.8 oz (49.8 kg).  LABS: CBC:    Component Value Date/Time   WBC 9.8 04/23/2021 0000   WBC 11.7 (H) 04/04/2021 0340   HGB 13.3 (A) 04/23/2021 0000   HCT 39 (A) 04/23/2021 0000   PLT 214 04/23/2021 0000   MCV 85.6 04/04/2021 0340   NEUTROABS 5.4 04/01/2021  1912   LYMPHSABS 0.5 (L) 04/01/2021 1912   MONOABS 0.6 04/01/2021 1912   EOSABS 0.0 04/01/2021 1912   BASOSABS 0.0 04/01/2021 1912   Comprehensive Metabolic Panel:    Component Value Date/Time   NA 135 (A) 04/23/2021 0000   K 4.2 04/23/2021 0000   CL 96 (A) 04/23/2021 0000   CO2 28 (A) 04/23/2021 0000   BUN 28 (A) 04/23/2021 0000   CREATININE 0.6 04/23/2021 0000   CREATININE 0.69 04/04/2021 0340   CREATININE 0.83 09/11/2020 0000   GLUCOSE 148 (H) 04/04/2021 0340   CALCIUM 9.0 04/23/2021 0000   AST 31 04/04/2021 0340   ALT 11 04/04/2021 0340   ALKPHOS  58 04/04/2021 0340   BILITOT 0.8 04/04/2021 0340   PROT 5.2 (L) 04/04/2021 0340   ALBUMIN 3.0 (L) 04/04/2021 0340    RADIOGRAPHIC STUDIES: No results found.  PERFORMANCE STATUS (ECOG) : 2 - Symptomatic, <50% confined to bed  Review of Systems  Constitutional:  Positive for appetite change.  Musculoskeletal:  Positive for back pain.  Neurological:  Positive for weakness.  Unless otherwise noted, a complete review of systems is negative.  Physical Exam General: NAD, thin appearing  Cardiovascular: regular rate and rhythm Pulmonary: clear ant fields Abdomen: soft, nontender, + bowel sounds Extremities: no edema, no joint deformities Skin: no rashes Neurological: Weakness, in a wheelchair, talkative, AAO x3  IMPRESSION:  This is my first encounter with Bradley Oliver.  He presents to the clinic in a wheelchair from [facility] rehabilitation.   I introduced myself, Jarrett Soho, RN and palliative's role in collaboration with his oncology team. Concept of Palliative Care was introduced as specialized medical care for people and their families living with serious illness.  It focuses on providing relief from the symptoms and stress of a serious illness.  The goal is to improve quality of life for both the patient and the family. Values and goals of care important to patient and family were attempted to be elicited.   Bradley Oliver shares he lived at home with his 20 year old mom and sister prior to going to [facility]. He is a former Land and a Scientist, physiological. He never married or had kids. He speaks to his high intelligence.   Bradley Oliver states he does have occasional back pain which is much improved compared to months prior. His appetite is poor however he states he "force eats" as he knows that this is the only way for him to thrive and gain the nutritional support he needs. He speaks to his what he feels is progress at [facility]. He is ambulatory a few steps with a walker and 2 person  stand-by assistance. Requires some assistance with ADLs.  We discussed Her current illness and what it means in the larger context of Her on-going co-morbidities. Natural disease trajectory and expectations were discussed. Bradley Oliver understanding and identifies that all treatment modalities are with palliative intent.   We discussed at length his code status, advanced directives, and wishes. Bradley Oliver does have a documented advanced directive on file naming his brother, Bradley Oliver as his Scientist, research (medical). He expresses desires for DNR/DNI, and no artificial feedings.  I presented the MOST form and education was provided. Bradley Oliver outlined his wishes for the following treatment decisions:  Cardiopulmonary Resuscitation: Do Not Attempt Resuscitation (DNR/No CPR)  Medical Interventions: Comfort Measures: Keep clean, warm, and dry. Use medication by any route, positioning, wound care, and other measures to relieve pain and suffering. Use oxygen,  suction and manual treatment of airway obstruction as needed for comfort. Do not transfer to the hospital unless comfort needs cannot be met in current location.  Antibiotics: No antibiotics (use other measures to relieve symptoms)  IV Fluids: No IV fluids (provide other measures to ensure comfort)  Feeding Tube: No feeding tube     I discussed the importance of continued conversation with family and their medical providers regarding overall plan of care and treatment options, ensuring decisions are within the context of the patients values and GOCs.  PLAN: Completed out-of facility DNR/MOST form as requested. Original given to patient.  Encouraged ongoing goals of care discussions. Palliative will continue to offer support. I will plan to see him back in 1-2 weeks for follow-up. Patient also to be supported by [facility]'s outpatient Palliative.    Patient expressed understanding and was in agreement with this plan. He also understands that  He can call the clinic at any time with any questions, concerns, or complaints.   Time Total: 55 min   Visit consisted of counseling and education dealing with the complex and emotionally intense issues of symptom management and palliative care in the setting of serious and potentially life-threatening illness.Greater than 50%  of this time was spent counseling and coordinating care related to the above assessment and plan.  Signed by: Alda Lea, AGPCNP-BC Palliative Medicine Team

## 2021-05-04 ENCOUNTER — Other Ambulatory Visit: Payer: Self-pay

## 2021-05-04 ENCOUNTER — Ambulatory Visit
Admission: RE | Admit: 2021-05-04 | Discharge: 2021-05-04 | Disposition: A | Discharge status: Home / Self Care | Payer: Medicare PPO | Source: Ambulatory Visit | Attending: Radiation Oncology | Admitting: Radiation Oncology

## 2021-05-04 ENCOUNTER — Non-Acute Institutional Stay: Payer: Self-pay | Admitting: Hospice

## 2021-05-04 ENCOUNTER — Telehealth: Payer: Self-pay | Admitting: Nurse Practitioner

## 2021-05-04 DIAGNOSIS — C01 Malignant neoplasm of base of tongue: Secondary | ICD-10-CM | POA: Diagnosis not present

## 2021-05-04 DIAGNOSIS — E43 Unspecified severe protein-calorie malnutrition: Secondary | ICD-10-CM

## 2021-05-04 DIAGNOSIS — R531 Weakness: Secondary | ICD-10-CM

## 2021-05-04 DIAGNOSIS — Z515 Encounter for palliative care: Secondary | ICD-10-CM

## 2021-05-04 DIAGNOSIS — C7951 Secondary malignant neoplasm of bone: Secondary | ICD-10-CM

## 2021-05-04 NOTE — Progress Notes (Signed)
[facility] Consult Note Telephone: [phone]  Fax: [phone]  PATIENT NAME: Bradley Oliver. [place] [address] [address] [phone] (home)  DOB: July 02, 1955 MRN: [id number]  PRIMARY CARE PROVIDER:    Patient, [name],  No address on file None  REFERRING PROVIDER:   Dr. Gean Birchwood  RESPONSIBLE PARTY:   Self Contact Information     Name Relation Home Work [place], Roderic [place] Brother   [phone]   [facility] Mother [phone]     Joangel, Vanosdol Sister [phone]     Rema Fendt   [phone]        I met face to face with patient at facility. Palliative Care was asked to follow this patient by consultation request of  Dr. Gean Birchwood to address advance care planning, complex medical decision making and goals of care clarification. Patient endorsed palliative service. This is the initial visit.    ASSESSMENT AND / RECOMMENDATIONS:   Advance Care Planning: Our advance care planning conversation included a discussion about:    The value and importance of advance care planning  Difference between Hospice and Palliative care Exploration of goals of care in the event of a sudden injury or illness  Identification and preparation of a healthcare agent  Review and updating or creation of an  advance directive document . Decision not to resuscitate or to de-escalate disease focused treatments due to poor prognosis.  CODE STATUS:Patient affirmed he is a DNR  Goals of Care: Goals include to maximize quality of life and symptom management  I spent  20 minutes providing this initial consultation. More than 50% of the time in this consultation was spent on counseling patient and coordinating communication. --------------------------------------------------------------------------------------------------------------------------------------  Symptom Management/Plan: Neoplasm of bone:  Followed by Oncologist, currently getting radiation.  Weakness: related to neoplasm. PT/OT is ongoing for strengthening and gait training Severe Protein Caloric Malnutrition: Continue mechanical soft diet, offer help as needed during meals to ensure adequate oral intake. Continue magic cup and Ensure. If needed, initiate Mirtazapine 7.36m at bedtime to help boost appetite. Check weight regularly per facility protocol and report weight loss. ST consult as needed. Routine CBC CMP  Follow up: Palliative care will continue to follow for complex medical decision making, advance care planning, and clarification of goals. Return 6 weeks or prn. Encouraged to call provider sooner with any concerns.   Family /Caregiver/Community Supports: Patient in SNF for ongoing care.  HOSPICE ELIGIBILITY/DIAGNOSIS: TBD  Chief Complaint: Initial Palliative care visit  HISTORY OF PRESENT ILLNESS:  MDontel Harshberger SDahlenis a 65y.o. year old male  with multiple medical conditions including metastatic bone cancer for which patient is undergoing radiation. He endorses weakness which has worsened in the past month, worse during activities and this impairs his independence. He reports he participates in PT/OT and this is helpful. History of Squamous cell carcinoma of right tonsil, severe protein caloric malnutrition. Patient denies pain/discomfort; he is looking forward to going for radiation today; he reports after radiation, he is likely to commence chemotherapy. Collaboration discussion with facility provider Monina DNP, with no concerns at this time.  History obtained from review of EMR, discussion with primary team, caregiver, family and/or Mr. Bialecki.  Review and summarization of Epic records shows history from other than patient. Rest of 10 point ROS asked and negative.  I reviewed as needed, available labs, patient records, imaging, studies and related documents from the EMR.  ROS General: NAD EYES:  denies vision  changes ENMT: endorses dysphagia Cardiovascular: denies chest pain/discomfort Pulmonary: denies cough, denies SOB Abdomen: endorses good appetite, denies constipation/diarrhea GU: denies dysuria, urinary frequency MSK:  endorses weakness,  no falls reported Skin: denies rashes or wounds Neurological: denies pain, denies insomnia Psych: Endorses positive mood Heme/lymph/immuno: denies bruises, abnormal bleeding  Physical Exam: Height/Weight: 5 feet 10 inches/112.8 Ibs BMI 16.2 Constitutional: NAD General: Well groomed, cooperative EYES: anicteric sclera, lids intact, no discharge  ENMT: Moist mucous membrane CV: S1 S2, RRR, no LE edema Pulmonary: LCTA, no increased work of breathing, no cough, Abdomen: active BS + 4 quadrants, soft and non tender GU: no suprapubic tenderness MSK: weakness, sarcopenia, limited ROM Skin: warm and dry, no rashes or wounds on visible skin Neuro:  weakness, otherwise non focal Psych: non-anxious affect Hem/lymph/immuno: no widespread bruising   PAST MEDICAL HISTORY:  Active Ambulatory Problems    Diagnosis Date Noted   Neutropenic fever ([facility]) 11/10/2017   Squamous cell carcinoma of right tonsil (HCC) 11/10/2017   Protein-calorie malnutrition, severe 11/13/2017   Cord compression (HCC) 04/02/2021   Pleural effusion 04/02/2021   Elevated d-dimer 04/02/2021   Hypokalemia 04/02/2021   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   Pressure injury of skin 04/03/2021   Leukocytosis 04/16/2021   Bone metastases ([facility]) 04/20/2021   Resolved Ambulatory Problems    Diagnosis Date Noted   No Resolved Ambulatory Problems   Past Medical History:  Diagnosis Date   Blood in stool    Cancer ([facility]) 08/05/14    SOCIAL HX:  Social History   Tobacco Use   Smoking status: Former   Smokeless tobacco: Never   Tobacco comments:    smoked for short time as teenager  Substance Use Topics   Alcohol use: Not Currently     FAMILY HX:  Family History  Problem  Relation Age of Onset   Hypertension Mother    Diabetes Father    Diabetes Paternal Grandmother    Diabetes Paternal Grandfather       ALLERGIES: No Known Allergies    PERTINENT MEDICATIONS:  Outpatient Encounter Medications as of 05/04/2021  Medication Sig   bisacodyl (DULCOLAX) 10 MG suppository Place 10 mg rectally as needed for moderate constipation.   dexamethasone (DECADRON) 2 MG tablet Take 2 mg by mouth 2 (two) times daily.   Ensure Plus (ENSURE PLUS) LIQD Take 237 mLs by mouth in the morning, at noon, and at bedtime.   levothyroxine (SYNTHROID) 50 MCG tablet Take 50 mcg by mouth daily.   lidocaine (LIDODERM) 5 % Place 1 patch onto the skin daily as needed (pain).   Magnesium Hydroxide (MILK OF MAGNESIA PO) Take 30 mLs by mouth as needed.   Multiple Vitamins-Minerals (MULTIVITAMIN ADULT) TABS Take 1 tablet by mouth daily. with Omega 3   polyethylene glycol (MIRALAX / GLYCOLAX) 17 g packet Take 17 g by mouth daily as needed for mild constipation.   Sodium Phosphates (RA SALINE ENEMA RE) Place 1 Dose rectally as needed.   No facility-administered encounter medications on file as of 05/04/2021.    Thank you for the opportunity to participate in the care of Mr. Braddy.  The palliative care team will continue to follow. Please call our office at [phone] if we can be of additional assistance.   Note: Portions of this note were generated with [employer]. Dictation errors may occur despite best attempts at proofreading.  Teodoro Spray, NP

## 2021-05-04 NOTE — Telephone Encounter (Signed)
Scheduled per 12/12 los, pt has been called and confirmed appt

## 2021-05-05 ENCOUNTER — Ambulatory Visit
Admission: RE | Admit: 2021-05-05 | Discharge: 2021-05-05 | Disposition: A | Discharge status: Home / Self Care | Payer: Medicare PPO | Source: Ambulatory Visit | Attending: Radiation Oncology | Admitting: Radiation Oncology

## 2021-05-05 DIAGNOSIS — C01 Malignant neoplasm of base of tongue: Secondary | ICD-10-CM | POA: Diagnosis not present

## 2021-05-06 ENCOUNTER — Other Ambulatory Visit: Payer: Self-pay

## 2021-05-06 ENCOUNTER — Ambulatory Visit
Admission: RE | Admit: 2021-05-06 | Discharge: 2021-05-06 | Disposition: A | Discharge status: Home / Self Care | Payer: Medicare PPO | Source: Ambulatory Visit | Attending: Radiation Oncology | Admitting: Radiation Oncology

## 2021-05-06 DIAGNOSIS — C01 Malignant neoplasm of base of tongue: Secondary | ICD-10-CM | POA: Diagnosis not present

## 2021-05-07 ENCOUNTER — Telehealth: Payer: Self-pay

## 2021-05-07 ENCOUNTER — Encounter: Payer: Self-pay | Admitting: Adult Health

## 2021-05-07 ENCOUNTER — Telehealth: Payer: Self-pay | Admitting: Oncology

## 2021-05-07 ENCOUNTER — Ambulatory Visit: Payer: Medicare PPO

## 2021-05-07 ENCOUNTER — Inpatient Hospital Stay: Payer: Medicare PPO | Admitting: Oncology

## 2021-05-07 ENCOUNTER — Non-Acute Institutional Stay (SKILLED_NURSING_FACILITY): Payer: Medicare PPO | Admitting: Adult Health

## 2021-05-07 DIAGNOSIS — F419 Anxiety disorder, unspecified: Secondary | ICD-10-CM

## 2021-05-07 DIAGNOSIS — E039 Hypothyroidism, unspecified: Secondary | ICD-10-CM | POA: Diagnosis not present

## 2021-05-07 DIAGNOSIS — G9520 Unspecified cord compression: Secondary | ICD-10-CM

## 2021-05-07 NOTE — Telephone Encounter (Signed)
Spoke with pt's nurse regarding his appointment today. Pt's nurse states that the pt never informed [facility] of his appointment so they have no available transportation at his requested time. Pt's nurse states that pts are supposed to give [facility] advanced notice of their appointments. This RN verbalized understanding. This RN verbally communicated with the scheduler at [facility] to reach out to the facility Stefanie [facility]) to reschedule. MD [name] aware

## 2021-05-07 NOTE — Telephone Encounter (Signed)
VM received from this pt stating that  he wants to be rescheduled to see MD Sherrill before 05/12/21 to discuss foundation one. Scheduling department notified of his request.

## 2021-05-07 NOTE — Telephone Encounter (Signed)
Rescheduled appt from 12/16 to 12/19 due to transportation., spoke with Hilda Blades at living facility. She is aware of location and appt time.

## 2021-05-07 NOTE — Progress Notes (Addendum)
Location:  [place] Room Number: 992-E Place of Service:  SNF (31) Provider:  Durenda Age, DNP, FNP-BC  Patient Care Team: Patient, [name] as PCP - General (General Practice)  Extended Emergency Contact Information Primary Emergency Contact: Izaiyah, [place] Mobile Phone: [phone] Relation: Brother Secondary Emergency Contact: Lynn of [place] Phone: [phone] Relation: Mother  Code Status:  DNR  Goals of care: Advanced Directive information Advanced Directives 05/07/2021  Does Patient Have a Medical Advance Directive? Yes  Type of Paramedic of [place];Living will;Out of facility DNR (pink MOST or yellow form)  Does patient want to make changes to medical advance directive? No - Patient declined  Copy of Coffee in Chart? Yes - validated most recent copy scanned in chart (See row information)     Chief Complaint  Patient presents with   Acute Visit    Short term rehabilitation visit.    HPI:  Pt is a 65 y.o. male seen today for medical management of chronic diseases.  He says short-term care resident of [facility]. He complains of anxiety. He said that his family is "stressing " him up.  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  He is S/P decompression of T4-T6 with T5 transverse decompression, T3-T7 pedicle screw instrumentation on 04/03/2021. He is currently taking dexamethasone 2 mg 1 tab twice a day.  He takes Synthroid 50 mcg 1 tab daily for hypothyroidism.  Past Medical History:  Diagnosis Date   Blood in stool    Cancer ([place]) 08/05/14   right tonsil, P16 positive   Past Surgical History:  Procedure Laterality Date   EYE SURGERY     KNEE SURGERY     LUMBAR PERCUTANEOUS PEDICLE SCREW 4 LEVEL N/A 04/02/2021   Procedure: THORACIC  THREE-THORACIC SEVEN, INSTRUMENTATION AND FUSION, THORACIC FOUR-THORACIC SIX LAMINECTOMY, THORACIC FIVE TRANSPEDICULAR DECOMPRESSION;  Surgeon: Dawley, Theodoro Doing, DO;  Location: [place];  Service: Neurosurgery;  Laterality: N/A;   tonsil biopsy Right 08/05/14   invasive squamous cell carcinoma    No Known Allergies  Outpatient Encounter Medications as of 05/07/2021  Medication Sig   bisacodyl (DULCOLAX) 10 MG suppository Place 10 mg rectally as needed for moderate constipation.   dexamethasone (DECADRON) 2 MG tablet Take 2 mg by mouth 2 (two) times daily.   Ensure Plus (ENSURE PLUS) LIQD Take 237 mLs by mouth in the morning, at noon, and at bedtime.   levothyroxine (SYNTHROID) 50 MCG tablet Take 50 mcg by mouth daily.   lidocaine (LIDODERM) 5 % Place 1 patch onto the skin daily as needed (pain).   lidocaine (XYLOCAINE) 2 % solution Use as directed 10 mLs in the mouth or throat as needed for mouth pain.   Magnesium Hydroxide (MILK OF MAGNESIA PO) Take 30 mLs by mouth as needed.   Multiple Vitamins-Minerals (MULTIVITAMIN ADULT) TABS Take 1 tablet by mouth daily. with Omega 3   polyethylene glycol (MIRALAX / GLYCOLAX) 17 g packet Take 17 g by mouth daily as needed for mild constipation.   Sodium Phosphates (RA SALINE ENEMA RE) Place 1 Dose rectally as needed.   No facility-administered encounter medications on file as of 05/07/2021.    Review of Systems  GENERAL: No change in appetite, no weight changes, no fever, chills  MOUTH and THROAT: Denies oral discomfort, gingival pain or bleeding RESPIRATORY: no cough, SOB, DOE,  wheezing, hemoptysis CARDIAC: No chest pain, edema or palpitations GI: No abdominal pain, diarrhea, heart burn, nausea or vomiting GU: Denies dysuria, frequency, hematuria or discharge NEUROLOGICAL: Denies dizziness, syncope, numbness, or headache PSYCHIATRIC: + anxiety  Immunization History  Administered Date(s) Administered   Janssen (J&J) SARS-COV-2 Vaccination  08/31/2019   PPD Test 09/14/2017   Unspecified SARS-COV-2 Vaccination 09/21/2019   Pertinent  Health Maintenance Due  Topic Date Due   COLONOSCOPY (Pts 45-60yrs Insurance coverage will need to be confirmed)  Never done   INFLUENZA VACCINE  Never done   Fall Risk 04/07/2021 04/08/2021 04/08/2021 04/09/2021 04/20/2021  Patient Fall Risk Level High fall risk High fall risk High fall risk High fall risk High fall risk     Vitals:   05/07/21 0957  BP: 107/62  Pulse: 100  Resp: 17  Temp: 98.8 F (37.1 C)  Weight: 112 lb 12.8 oz (51.2 kg)  Height: 5\' 10"  (1.778 m)   Body mass index is 16.19 kg/m.  Physical Exam  GENERAL APPEARANCE:  In no acute distress.  SKIN:  Skin is warm and dry.  MOUTH and THROAT: Lips are without lesions. Oral mucosa is moist and without lesions.  RESPIRATORY:  BS CTAB CARDIAC: RRR, no murmur,no extra heart sounds, no edema GI: Abdomen soft, normal BS, no masses, no tenderness, NEUROLOGICAL: There is no tremor. Speech is clear. Alert and oriented X 3. PSYCHIATRIC: Anxious  Labs reviewed: Recent Labs    09/11/20 0000 04/01/21 1912 04/02/21 2341 04/04/21 0340 04/14/21 0000 04/21/21 0000 04/23/21 0000  NA 141 137   < > 133* 135* 135* 135*  K 4.7 3.4*   < > 3.8 4.4 3.7 4.2  CL 105 101  --  101 102 100 96*  CO2 25 27  --  25 23* 22 28*  GLUCOSE 93 97  --  148*  --   --   --   BUN 19 20  --  17 49* 32* 28*  CREATININE 0.83 0.72   < > 0.69 0.6 0.5* 0.6  CALCIUM 9.3 9.3  --  8.5* 8.3* 8.6* 9.0   < > = values in this interval not displayed.   Recent Labs    09/11/20 0000 04/01/21 1912 04/04/21 0340  AST 17 27 31   ALT 6* 11 11  ALKPHOS  --  82 58  BILITOT 0.6 1.2 0.8  PROT 6.1 7.2 5.2*  ALBUMIN  --  4.2 3.0*   Recent Labs    04/01/21 1912 04/02/21 2341 04/03/21 0225 04/04/21 0340 04/14/21 0000 04/21/21 0000 04/23/21 0000  WBC 6.6  --  10.6* 11.7* 18.9 11.2 9.8  NEUTROABS 5.4  --   --   --   --   --   --   HGB 14.0   < > 12.8*  12.2* 12.8* 12.4* 13.3*  HCT 40.9   < > 37.1* 35.8* 37* 37* 39*  MCV 88.1  --  86.7 85.6  --   --   --   PLT 274  --  205 224 224 221 214   < > = values in this interval not displayed.   Lab Results  Component Value Date   TSH 1.47 09/11/2020   No results found for: HGBA1C Lab Results  Component Value Date   CHOL 164 09/11/2020   HDL 48 09/11/2020   LDLCALC 95 09/11/2020   TRIG 118 09/11/2020   CHOLHDL 3.4 09/11/2020    Significant Diagnostic Results in last 30 days:  No results  found.  Assessment/Plan  1. Anxiety -  will re-start Ativan 0.5 mg 1 tab PO daily PRN x14 days  2. Cord compression ([place]) -  S/P decompression T4-T6 with T5 transverse decompression, T3-T7 pedicle screw instrumentation on 04/03/2021 -    Continue dexamethasone 2 mg 1 tab twice a day -    Continue PT and OT, for therapeutic strengthening exercises  3. Acquired hypothyroidism Lab Results  Component Value Date   TSH 1.47 09/11/2020   -   Continue Synthroid    Family/ staff Communication:   Discussed plan of care with resident and charge nurse.  Labs/tests ordered:   None  Goals of care:    Short-term care   Durenda Age, DNP, MSN, FNP-BC [facility] [phone] (Monday-Friday 8:00 a.m. - 5:00 p.m.) [phone] (after hours)

## 2021-05-10 ENCOUNTER — Inpatient Hospital Stay: Payer: Medicare PPO | Admitting: Oncology

## 2021-05-10 ENCOUNTER — Other Ambulatory Visit: Payer: Self-pay | Admitting: Radiation Oncology

## 2021-05-10 ENCOUNTER — Other Ambulatory Visit: Payer: Self-pay

## 2021-05-10 ENCOUNTER — Ambulatory Visit
Admission: RE | Admit: 2021-05-10 | Discharge: 2021-05-10 | Disposition: A | Discharge status: Home / Self Care | Payer: Medicare PPO | Source: Ambulatory Visit | Attending: Radiation Oncology | Admitting: Radiation Oncology

## 2021-05-10 VITALS — BP 116/79 | HR 88 | Temp 98.0°F | Resp 18 | Ht 70.0 in | Wt 112.1 lb

## 2021-05-10 DIAGNOSIS — C099 Malignant neoplasm of tonsil, unspecified: Secondary | ICD-10-CM

## 2021-05-10 DIAGNOSIS — C01 Malignant neoplasm of base of tongue: Secondary | ICD-10-CM | POA: Diagnosis not present

## 2021-05-10 MED ORDER — DEXAMETHASONE 2 MG PO TABS: ORAL_TABLET | ORAL | Status: DC

## 2021-05-10 NOTE — Progress Notes (Signed)
[place] OFFICE PROGRESS NOTE   Diagnosis: Metastatic head neck cancer  INTERVAL HISTORY:   I saw Mr. Premo when he was admitted last month with a cord compression syndrome secondary to metastatic tumor involving the thoracic spine.  He underwent decompression surgery 04/03/2021.  He now resides in a rehabilitation facility.  He is participating in physical therapy.  He is ambulating.  He reports gaining strength, improved bladder control, and diminished numbness in the lower extremities.  He is completing a course of palliative radiation to the thoracic spine and chest.  He is scheduled to finish radiation 05/13/2021.  Mr. Corbit reports tolerating the radiation well.  No dysphagia.  He is currently maintained on Decadron at a dose of 2 mg daily.  Objective:  Vital signs in last 24 hours:  Blood pressure 116/79, pulse 88, temperature 98 F (36.7 C), temperature source Oral, resp. rate 18, height 5' 10" (1.778 m), weight 112 lb 2 oz (50.9 kg), SpO2 99 %.    HEENT: No thrush Lymphatics: No cervical, supraclavicular, axillary, or inguinal nodes Resp: Lungs clear bilaterally Cardio: Regular rate and rhythm GI: Nontender, no hepatosplenomegaly Vascular: No leg edema Neuro: 5/5 strength in the upper extremities bilaterally, 4/5 strength in the lower extremities Skin: Thoracic wound is covered with a protective pad  Lab Results:  Lab Results  Component Value Date   WBC 9.8 04/23/2021   HGB 13.3 (A) 04/23/2021   HCT 39 (A) 04/23/2021   MCV 85.6 04/04/2021   PLT 214 04/23/2021   NEUTROABS 5.4 04/01/2021    CMP  Lab Results  Component Value Date   NA 135 (A) 04/23/2021   K 4.2 04/23/2021   CL 96 (A) 04/23/2021   CO2 28 (A) 04/23/2021   GLUCOSE 148 (H) 04/04/2021   BUN 28 (A) 04/23/2021   CREATININE 0.6 04/23/2021   CALCIUM 9.0 04/23/2021   PROT 5.2 (L) 04/04/2021   ALBUMIN 3.0 (L) 04/04/2021   AST 31 04/04/2021   ALT 11 04/04/2021   ALKPHOS 58  04/04/2021   BILITOT 0.8 04/04/2021   GFRNONAA >90 04/23/2021   GFRAA >90 04/23/2021    No results found for: CEA1, CEA, CAN199, CA125  No results found for: INR, LABPROT  Imaging:  No results found.  Medications: I have reviewed the patient's current medications.   Assessment/Plan:  1. 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, stage IVa (T2N2bM0) HPV positive 08/2014-09/2014-CRT 70 Gray in 35 fractions with cisplatin 20 mg per metered squared days 1-5, weeks 1 and 5 Recurrent disease April 2019-new left upper lobe cavitary lesion, infiltrative left hilar mass, prevascular and mediastinal adenopathy 09/28/2017-flexible bronchoscopy with EBUS-biopsy station 7 node, squamous cell carcinoma p16 positive 10/2017-12/2018-systemic therapy, carboplatin/docetaxel/pembrolizumab x5 followed by maintenance pembrolizumab, progression in 06/2018, 09/2018, and 12/2018 while on pembrolizumab-continue due to slow mild progression 07/13/2018-disease progression with new left upper lobe posterior segment lesion, new left upper lobe nodule, new left apical nodularity with subsequent scans 10/05/2018 and 01/11/2019 confirming disease progression 10/30/2020-PET add [facility] posterior mediastinal mass involving T5 and T6 and left posterior fifth and sixth ribs, hypermetabolic soft tissue within the prevascular mediastinum contiguous with the posterior mediastinal mass, hypermetabolic spiculated left upper lobe nodules, small left pleural effusion, focal small bowel uptake in left upper quadrant abutting the transverse colon Leg weakness and lower body numbness beginning 03/24/2021, fall prior to presenting to the emergency room 04/01/2021 MRI cervical, thoracic, and lumbar spine  level 04/02/2021-no evidence of metastatic disease of the cervical spine, 8 mm enhancing lesion in the inferior aspect of L5-nonspecific, no other evidence of  metastatic disease in the lumbar spine, large destructive and infiltrative metastatic deposit centered at the left paraspinous region involving T4-T7 with associated epidural extension with severe spinal stenosis, cord compression, cord signal changes at T4-5 through T6-7, metastatic lesion extends to involve the posterior medial aspect of the left lung and left mediastinum/hilar region, pathologic fracture of T5, 1 cm enhancing lesion in T11 indeterminate   2.  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 Klosinski is undergoing palliative radiation to the thoracic spine and chest after undergoing debulking surgery for tumor involving the thoracic spine.  He is tolerating radiation well.  He appears to be making progress from a neurologic standpoint.  He will continue physical therapy.  He continues a Decadron taper under the direction of Dr. Isidore Moos.  Mr. Scripter will complete radiation later this week.  He will be scheduled for baseline CTs during the week of 05/31/2020.  He is scheduled for a telehealth visit with Dr. Georgeanna Lea on 05/12/2021.  We are waiting on results of Foundation 1 testing on the thoracic tumor.  We discussed potential "standard "treatment options including treatment with EGFR inhibitors and capecitabine.  He will return for an office visit and restaging CTs during the week of 05/31/2020.  He will call in the interim as needed.  Betsy Coder, MD  05/10/2021  4:05 PM

## 2021-05-11 ENCOUNTER — Encounter: Payer: Self-pay | Admitting: General Practice

## 2021-05-11 ENCOUNTER — Telehealth: Payer: Self-pay | Admitting: Oncology

## 2021-05-11 ENCOUNTER — Telehealth: Payer: Self-pay | Admitting: *Deleted

## 2021-05-11 ENCOUNTER — Encounter: Payer: Self-pay | Admitting: Radiation Therapy

## 2021-05-11 ENCOUNTER — Ambulatory Visit
Admission: RE | Admit: 2021-05-11 | Discharge: 2021-05-11 | Disposition: A | Discharge status: Home / Self Care | Payer: Medicare PPO | Source: Ambulatory Visit | Attending: Radiation Oncology | Admitting: Radiation Oncology

## 2021-05-11 ENCOUNTER — Encounter: Payer: Self-pay | Admitting: *Deleted

## 2021-05-11 DIAGNOSIS — C01 Malignant neoplasm of base of tongue: Secondary | ICD-10-CM | POA: Diagnosis not present

## 2021-05-11 NOTE — Telephone Encounter (Signed)
Notified facility of additional appointment he has on 05/31/20 after 12:00 lab. Having CT scans and should be finished around 3 pm. She will add this info to his appointment.  Sent Mychart message to patient with scans on 1/19 at 1:30 pm w/arrival at 1:00 pm. NPO 4 hours prior and drink oral contrast at 1130 and 1230. Instructed him to get oral contrast tomorrow after his RT appointment. Notified managed care for PA.

## 2021-05-11 NOTE — Telephone Encounter (Signed)
Also called [facility] and spoke with Melene Muller for transportation for patients upcoming January appts for [facility]

## 2021-05-11 NOTE — Telephone Encounter (Signed)
Scheduled appt per 12/19 los -patient is aware of appt date and times.

## 2021-05-11 NOTE — Progress Notes (Signed)
Bradley Oliver  Reached Bradley Oliver by phone for follow-up support. He was in good spirits, citing a favorable update from Dr [name], and used the opportunity to share and process topics of meaning to him (health update, local interest, mechanical engineering insights). We plan to follow up again by phone in the new year.   [name], [place], [facility] Pager [phone] Voicemail [phone]

## 2021-05-11 NOTE — Progress Notes (Signed)
Per Dr. Pearlie Oyster request, I called the facility Mr. Cansler is currently a resident of, [facility], [phone]. I spoke with Mr. Yordy nurse, Shree, and shared the steroid taper instructions. Shree read back the instructions with understanding.    Steroid Taper Instructions:Called in on 05/11/21  Current dose, 2mg  BID. Starting on 05/23/21, taper to 2mg  daily. Starting on 05/30/21, taper to 1mg  (half tablet) daily. Last dose on 06/12/21.   Mont Dutton R.T.(R)(T) Radiation Special Procedures Navigator

## 2021-05-12 ENCOUNTER — Other Ambulatory Visit: Payer: Self-pay

## 2021-05-12 ENCOUNTER — Ambulatory Visit
Admission: RE | Admit: 2021-05-12 | Discharge: 2021-05-12 | Disposition: A | Discharge status: Home / Self Care | Payer: Medicare PPO | Source: Ambulatory Visit | Attending: Radiation Oncology | Admitting: Radiation Oncology

## 2021-05-12 ENCOUNTER — Encounter: Payer: Medicare PPO | Admitting: Nurse Practitioner

## 2021-05-12 ENCOUNTER — Ambulatory Visit: Payer: Medicare PPO

## 2021-05-12 DIAGNOSIS — C01 Malignant neoplasm of base of tongue: Secondary | ICD-10-CM | POA: Diagnosis not present

## 2021-05-13 ENCOUNTER — Encounter: Payer: Self-pay | Admitting: Radiation Oncology

## 2021-05-13 ENCOUNTER — Other Ambulatory Visit: Payer: Self-pay | Admitting: Adult Health

## 2021-05-13 ENCOUNTER — Ambulatory Visit
Admission: RE | Admit: 2021-05-13 | Discharge: 2021-05-13 | Disposition: A | Discharge status: Home / Self Care | Payer: Medicare PPO | Source: Ambulatory Visit | Attending: Radiation Oncology | Admitting: Radiation Oncology

## 2021-05-13 ENCOUNTER — Encounter: Payer: Self-pay | Admitting: *Deleted

## 2021-05-13 ENCOUNTER — Encounter: Payer: Medicare PPO | Admitting: Nurse Practitioner

## 2021-05-13 DIAGNOSIS — C01 Malignant neoplasm of base of tongue: Secondary | ICD-10-CM | POA: Diagnosis not present

## 2021-05-13 MED ORDER — LORAZEPAM 0.5 MG PO TABS: 0.5000 mg | ORAL_TABLET | Freq: Every day | ORAL | 0 refills | Status: DC | PRN

## 2021-05-13 NOTE — Progress Notes (Signed)
Documents faxed [place] request regarding Foundation One testing

## 2021-05-14 ENCOUNTER — Encounter: Payer: Self-pay | Admitting: Urology

## 2021-05-18 ENCOUNTER — Telehealth: Payer: Self-pay

## 2021-05-18 ENCOUNTER — Encounter: Payer: Self-pay | Admitting: *Deleted

## 2021-05-18 ENCOUNTER — Telehealth: Payer: Self-pay | Admitting: *Deleted

## 2021-05-18 NOTE — Telephone Encounter (Signed)
TC from Pt inquiring  dietary issues at the facility he is in. Informed Pt that he would have to speak with the [facility] facility Dr and nutritionist about his diet. Pt also inquired about contrast he is suppose to receive informed Pt that Charlean Sanfilippo also informed him that he will receive contrast when he goes to [facility] tomorrow. Pt verbalized understanding. No further problems or concerns noted.

## 2021-05-18 NOTE — Telephone Encounter (Signed)
RETURNED PATIENT'S PHONE CALL, SPOKE WITH PATIENT. ?

## 2021-05-18 NOTE — Progress Notes (Addendum)
Patient sent MyChart message regarding scheduling conflict for 09/28/46 w/Dr. Isidore Moos due to being at [facility] that day for follow up. Left message for Dr. Pearlie Oyster nurse to reschedule that day and call patient back at [phone]. Also called [facility] and left message for Dr. Pieter Partridge Dawle's secretary to please provide him #2 bottles of oral contrast when he is there tomorrow for his January scan. Requested a return call if there is any issue with this request. Received message from [facility] w/spine center and was told they do not schedule CT scans, so do not have access to oral contrast. Called back ([phone] ext [phone]) asking if there is another practice in the building that could provide the contrast for him tomorrow. Called [facility] to inquire if they could obtain contrast-transportation scheduler thinks if an order was sent to their pharmacy, it could be obtained that way. She will follow up with nursing and call back.

## 2021-05-19 ENCOUNTER — Encounter: Payer: Self-pay | Admitting: *Deleted

## 2021-05-19 NOTE — Progress Notes (Signed)
[facility] not able to provided oral contrast for 05/31/21 scan at visit today and there is no other provider office in building that can do so.  [facility] [phone] and confirmed w/them that they do not have this in stock. Called back to SNF and nurse will speak w/transportation coordinator, Hilda Blades to see if they can arrange to have some picked up for him. She will have Debra call back.

## 2021-05-19 NOTE — Progress Notes (Signed)
Nurse from [facility] called back and provided phone # [phone] to call inpatient pharmacy to arrange to send script for them to order the CT contrast from their buyer to ship to facility. It was decided by [facility] that one of our staff will drop off the contrast today on the way home to make this easier.

## 2021-05-20 ENCOUNTER — Encounter: Payer: Self-pay | Admitting: Oncology

## 2021-05-25 ENCOUNTER — Encounter: Payer: Self-pay | Admitting: Oncology

## 2021-05-26 ENCOUNTER — Encounter (HOSPITAL_COMMUNITY): Payer: Self-pay | Admitting: Oncology

## 2021-05-27 ENCOUNTER — Encounter: Payer: Self-pay | Admitting: Oncology

## 2021-05-31 ENCOUNTER — Non-Acute Institutional Stay (SKILLED_NURSING_FACILITY): Payer: Medicare PPO | Admitting: Adult Health

## 2021-05-31 ENCOUNTER — Ambulatory Visit (HOSPITAL_BASED_OUTPATIENT_CLINIC_OR_DEPARTMENT_OTHER)
Admission: RE | Admit: 2021-05-31 | Discharge: 2021-05-31 | Disposition: A | Discharge status: Home / Self Care | Payer: Medicare PPO | Source: Ambulatory Visit | Attending: Oncology | Admitting: Oncology

## 2021-05-31 ENCOUNTER — Other Ambulatory Visit: Payer: Self-pay

## 2021-05-31 ENCOUNTER — Encounter: Payer: Self-pay | Admitting: Adult Health

## 2021-05-31 ENCOUNTER — Inpatient Hospital Stay: Payer: Medicare PPO | Attending: Internal Medicine

## 2021-05-31 ENCOUNTER — Encounter (HOSPITAL_BASED_OUTPATIENT_CLINIC_OR_DEPARTMENT_OTHER): Payer: Self-pay

## 2021-05-31 DIAGNOSIS — C099 Malignant neoplasm of tonsil, unspecified: Secondary | ICD-10-CM | POA: Insufficient documentation

## 2021-05-31 DIAGNOSIS — Z79899 Other long term (current) drug therapy: Secondary | ICD-10-CM | POA: Diagnosis not present

## 2021-05-31 DIAGNOSIS — C7951 Secondary malignant neoplasm of bone: Secondary | ICD-10-CM | POA: Diagnosis not present

## 2021-05-31 DIAGNOSIS — J90 Pleural effusion, not elsewhere classified: Secondary | ICD-10-CM | POA: Insufficient documentation

## 2021-05-31 DIAGNOSIS — C7802 Secondary malignant neoplasm of left lung: Secondary | ICD-10-CM | POA: Insufficient documentation

## 2021-05-31 DIAGNOSIS — E43 Unspecified severe protein-calorie malnutrition: Secondary | ICD-10-CM | POA: Diagnosis not present

## 2021-05-31 DIAGNOSIS — M8448XA Pathological fracture, other site, initial encounter for fracture: Secondary | ICD-10-CM | POA: Diagnosis not present

## 2021-05-31 DIAGNOSIS — I700 Atherosclerosis of aorta: Secondary | ICD-10-CM | POA: Insufficient documentation

## 2021-05-31 DIAGNOSIS — R1312 Dysphagia, oropharyngeal phase: Secondary | ICD-10-CM

## 2021-05-31 DIAGNOSIS — D1809 Hemangioma of other sites: Secondary | ICD-10-CM | POA: Diagnosis not present

## 2021-05-31 LAB — CMP (CANCER CENTER ONLY)
ALT: 23 U/L (ref 0–44)
AST: 22 U/L (ref 15–41)
Albumin: 3.6 g/dL (ref 3.5–5.0)
Alkaline Phosphatase: 45 U/L (ref 38–126)
Anion gap: 9 (ref 5–15)
BUN: 31 mg/dL — ABNORMAL HIGH (ref 8–23)
CO2: 29 mmol/L (ref 22–32)
Calcium: 8.9 mg/dL (ref 8.9–10.3)
Chloride: 98 mmol/L (ref 98–111)
Creatinine: 0.68 mg/dL (ref 0.61–1.24)
GFR, Estimated: >60 mL/min (ref >60)
Glucose, Bld: 86 mg/dL (ref 70–99)
Potassium: 3.8 mmol/L (ref 3.5–5.1)
Sodium: 136 mmol/L (ref 135–145)
Total Bilirubin: 0.7 mg/dL (ref 0.3–1.2)
Total Protein: 5.9 g/dL — ABNORMAL LOW (ref 6.5–8.1)

## 2021-05-31 LAB — CBC WITH DIFFERENTIAL (CANCER CENTER ONLY)
Abs Immature Granulocytes: 0.4 10*3/uL — ABNORMAL HIGH (ref 0.00–0.07)
Basophils Absolute: 0 10*3/uL (ref 0.0–0.1)
Basophils Relative: 0 %
Eosinophils Absolute: 0 10*3/uL (ref 0.0–0.5)
Eosinophils Relative: 0 %
HCT: 42.8 % (ref 39.0–52.0)
Hemoglobin: 14.1 g/dL (ref 13.0–17.0)
Immature Granulocytes: 5 %
Lymphocytes Relative: 7 %
Lymphs Abs: 0.5 10*3/uL — ABNORMAL LOW (ref 0.7–4.0)
MCH: 30.3 pg (ref 26.0–34.0)
MCHC: 32.9 g/dL (ref 30.0–36.0)
MCV: 91.8 fL (ref 80.0–100.0)
Monocytes Absolute: 0.5 10*3/uL (ref 0.1–1.0)
Monocytes Relative: 6 %
Neutro Abs: 5.9 10*3/uL (ref 1.7–7.7)
Neutrophils Relative %: 82 %
Platelet Count: 191 10*3/uL (ref 150–400)
RBC: 4.66 MIL/uL (ref 4.22–5.81)
RDW: 17.4 % — ABNORMAL HIGH (ref 11.5–15.5)
WBC Count: 7.3 10*3/uL (ref 4.0–10.5)
nRBC: 0 % (ref 0.0–0.2)

## 2021-05-31 LAB — TSH: TSH: 3.287 u[IU]/mL (ref 0.350–4.500)

## 2021-05-31 IMAGING — CT CT NECK W/ CM
5 series · 16 of 35 positions shown, 18 images · IV contrast (APPLIED)
Comparison: [DATE]

CLINICAL DATA: Restaging head neck cancer

EXAM:
CT NECK WITH CONTRAST
TECHNIQUE: Multidetector CT imaging of the neck was performed using the
standard protocol following the bolus administration of intravenous
contrast.
CONTRAST:  75mL OMNIPAQUE IOHEXOL 300 MG/ML  SOLN

[Series 3: axial neck (person_name) · axial · 0.50mm/px · z∈[+948,+1032]mm · 2 of 126 slices shown]
[im 42/126  bone]
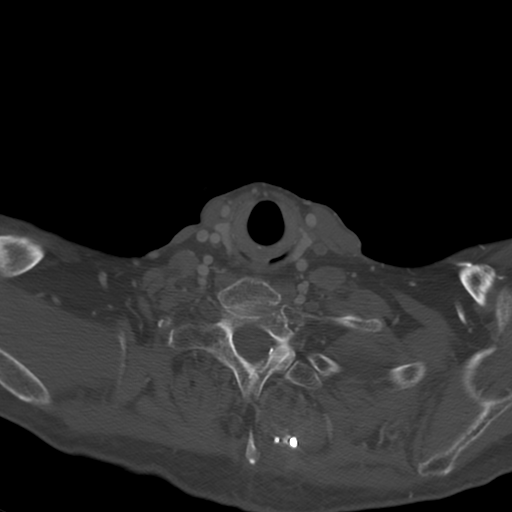
[im 84/126  bone]
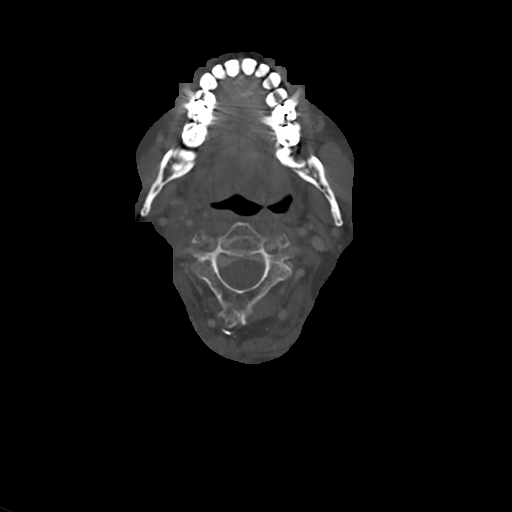

[Series 4: ax bone · axial · 0.50mm/px · z∈[+928,+1052]mm · 3 of 126 slices shown]
[im 32/126  bone]
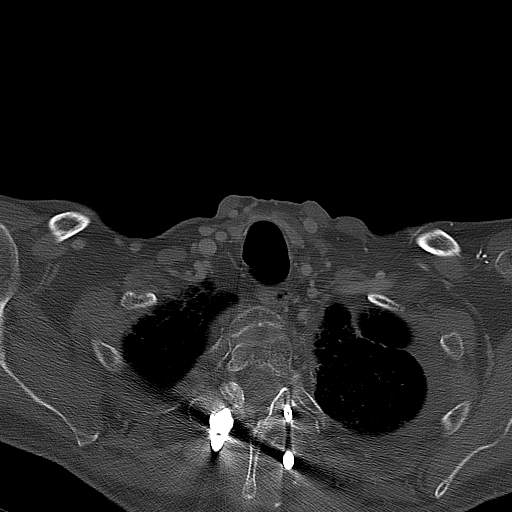
[im 63/126  bone]
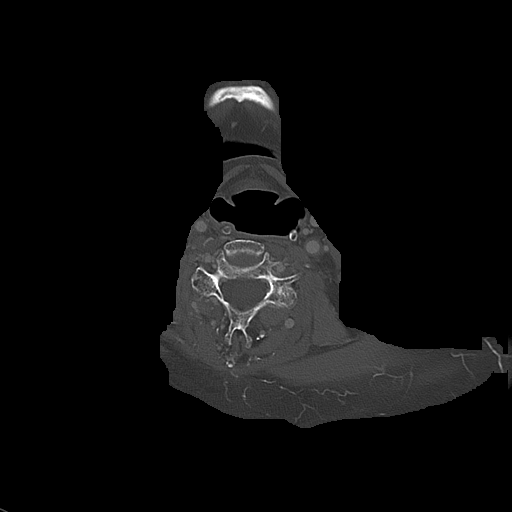
[im 94/126  bone]
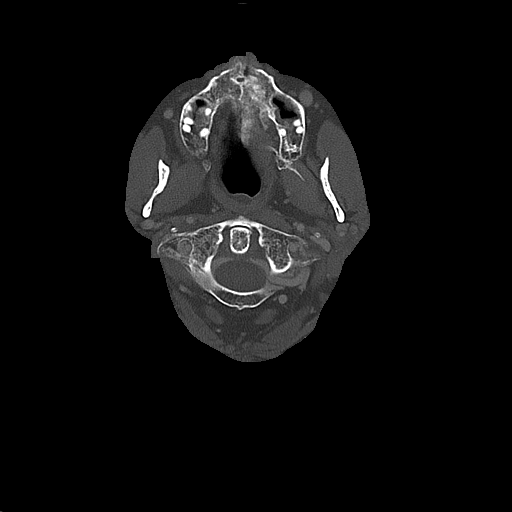

[Series 5: cor neck · coronal · 0.47mm/px · 3 of 139 slices shown]
[im 28/139  bone]
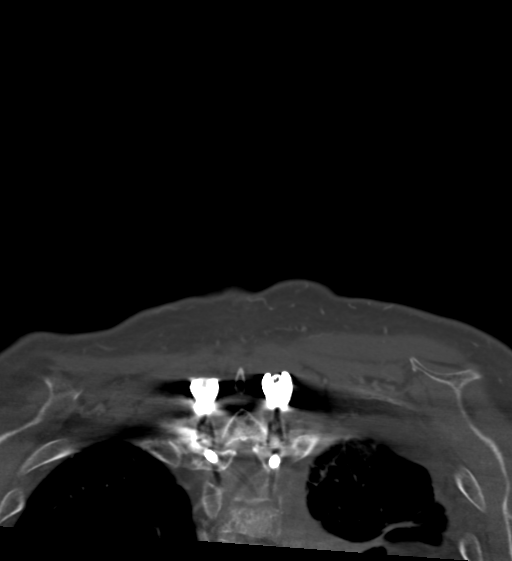
[im 56/139  bone]
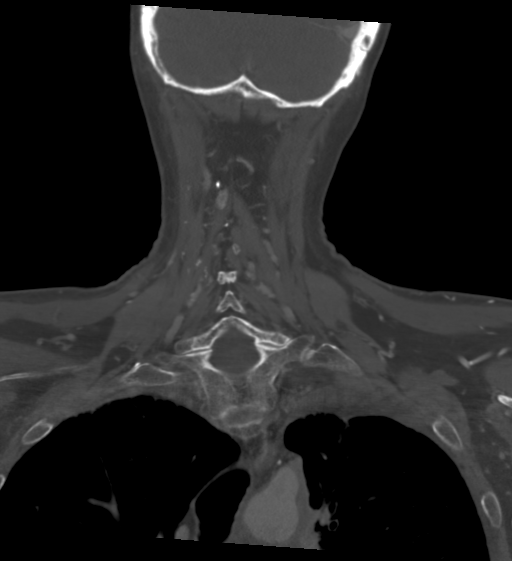
[im 83/139  bone]
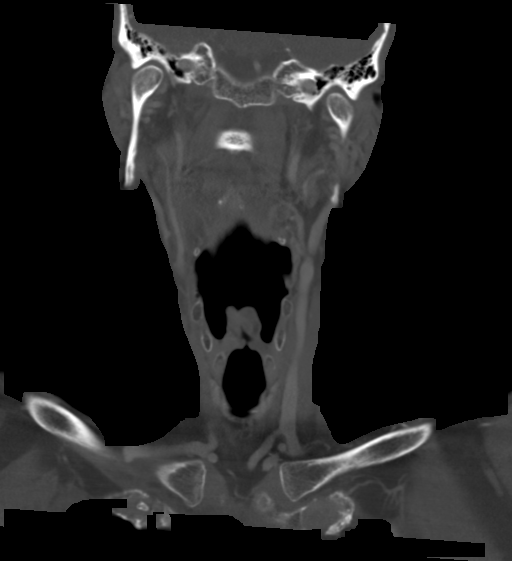

[Series 6: sag neck · sagittal · 0.52mm/px · 5 of 122 slices shown, 6 images]
[im 41/122  bone]
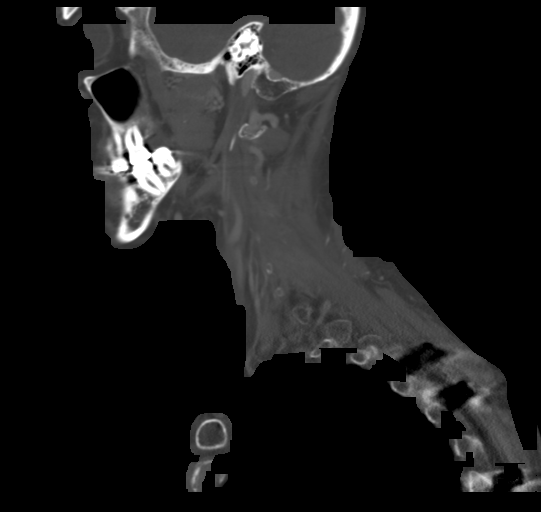
[im 51/122  bone]
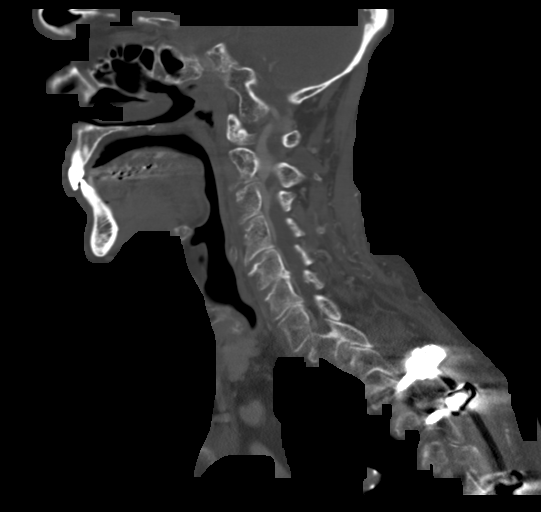
[im 61/122  soft-tissue]
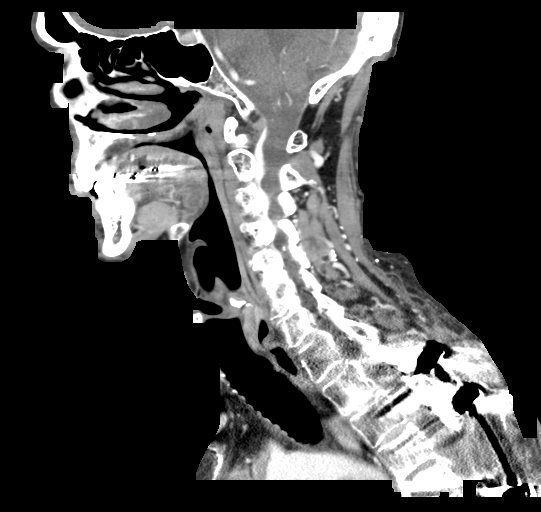
[im 61/122  bone]
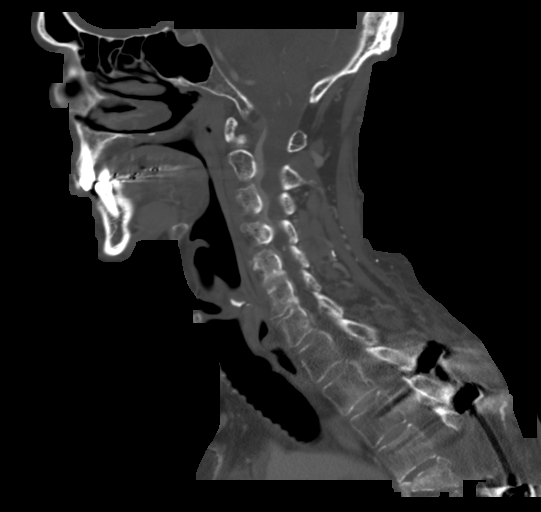
[im 71/122  bone]
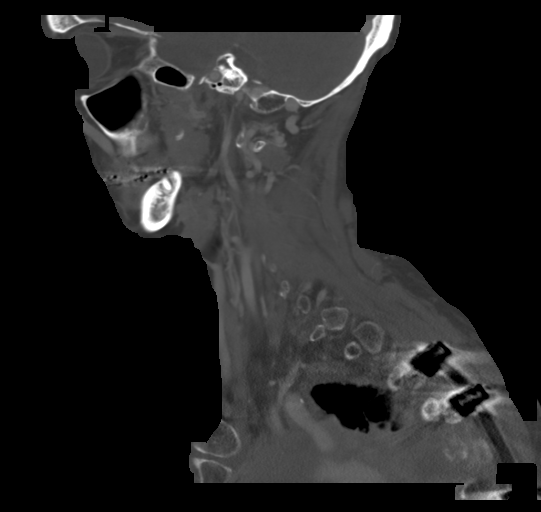
[im 81/122  bone]
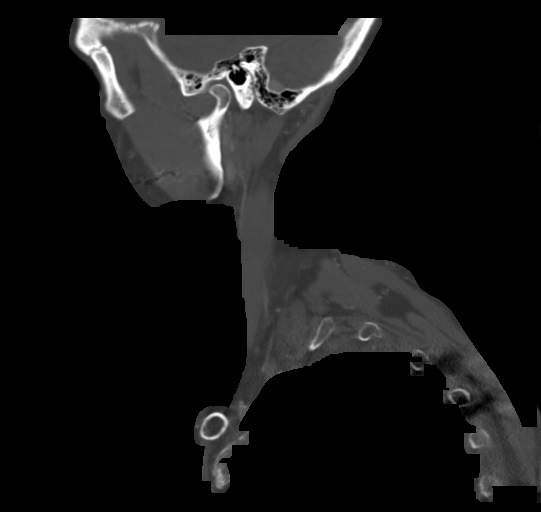

[Series 7: ax oropharynx · axial · 0.47mm/px · z∈[+916,+1048]mm · 3 of 133 slices shown, 4 images]
[im 34/133  soft-tissue]
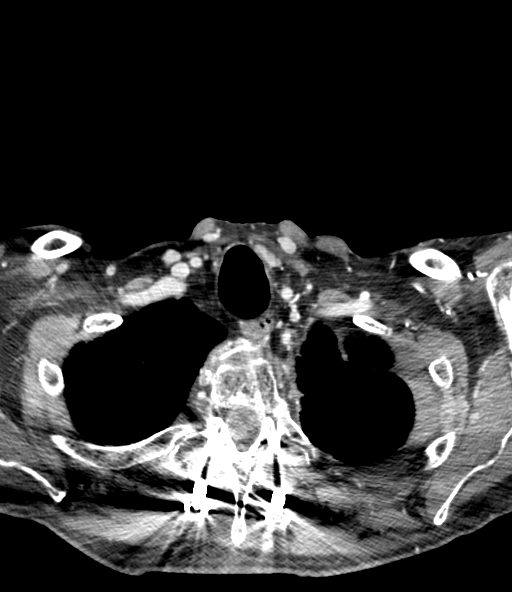
[im 34/133  bone]
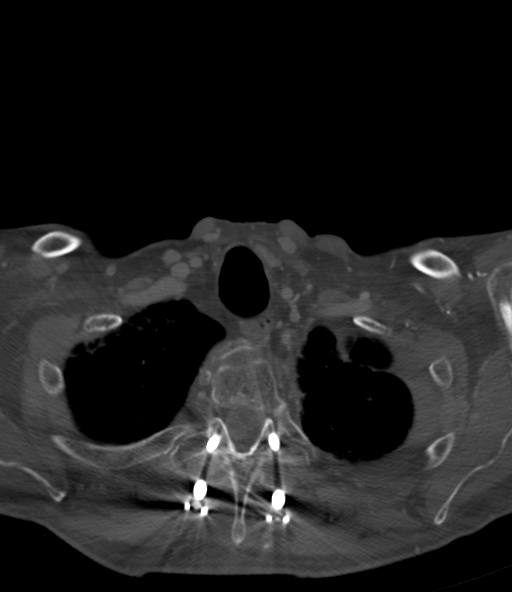
[im 67/133  bone]
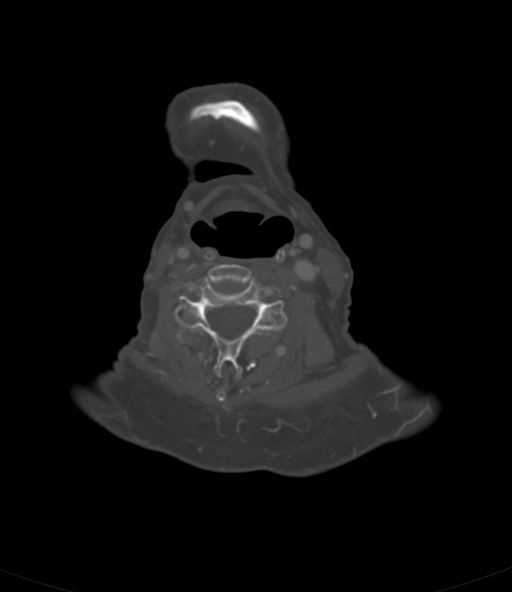
[im 100/133  bone]
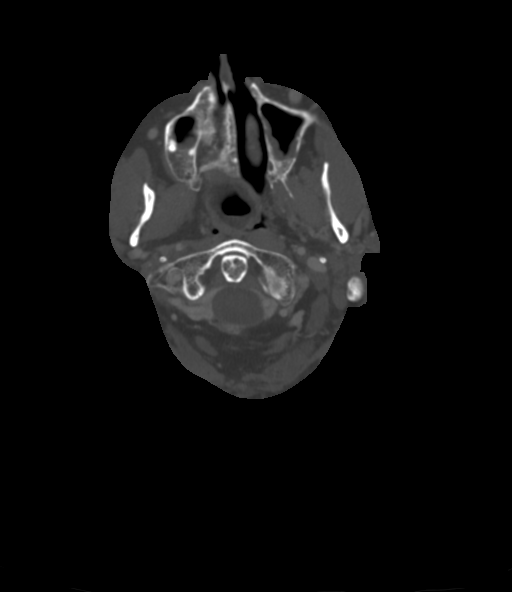

[16 of 35 positions shown; findings below may reference images not displayed]

FINDINGS: Pharynx and larynx: Post treatment mucosal spaces with no evidence
of tumor or swelling.

Salivary glands: Resected right submandibular gland. No active
disease

Thyroid: Symmetric small gland

Lymph nodes: None enlarged or abnormal density.

Vascular: Post treatment occluded segments of the bilateral IJ,
superiorly on the right and inferiorly on the left.

Limited intracranial: Negative

Visualized orbits: Negative

Mastoids and visualized paranasal sinuses: Clear

Skeleton: No acute or destructive findings in the head and cervical
spine. Post treatment thoracic spine described on dedicated chest
CT.

Upper chest: Described on dedicated CT.
IMPRESSION: 1. No evidence of residual or recurrent disease in the neck.
2. Chest reported separately.

## 2021-05-31 IMAGING — CT CT CHEST-ABD-PELV W/ CM
2 of 5 series · 11 of 46 positions shown, 12 images · IV contrast (APPLIED)
Comparison: Multiple exams, including [DATE]

CLINICAL DATA: Tonsillar cancer with left thoracic spinal and
paraspinal metastatic disease causing prior cord compression and
with left lung involvement.

EXAM:
CT CHEST, ABDOMEN, AND PELVIS WITH CONTRAST
TECHNIQUE: Multidetector CT imaging of the chest, abdomen and pelvis was
performed following the standard protocol during bolus
administration of intravenous contrast.
CONTRAST:  75mL OMNIPAQUE IOHEXOL 300 MG/ML  SOLN

[Series 3: cap with · axial · 0.67mm/px · z∈[+346,+891]mm · 8 of 135 slices shown, 9 images]
[im 13/135  soft-tissue]
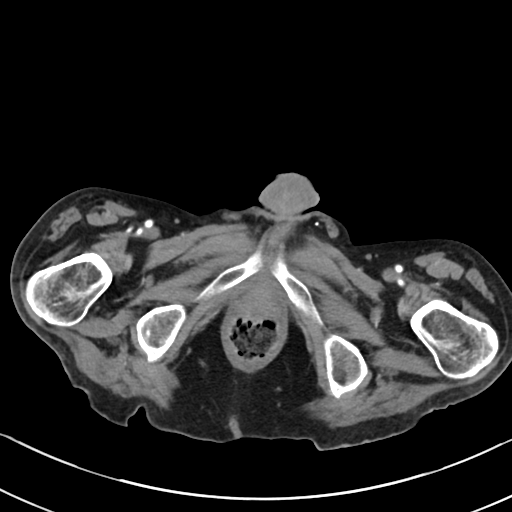
[im 13/135  bone]
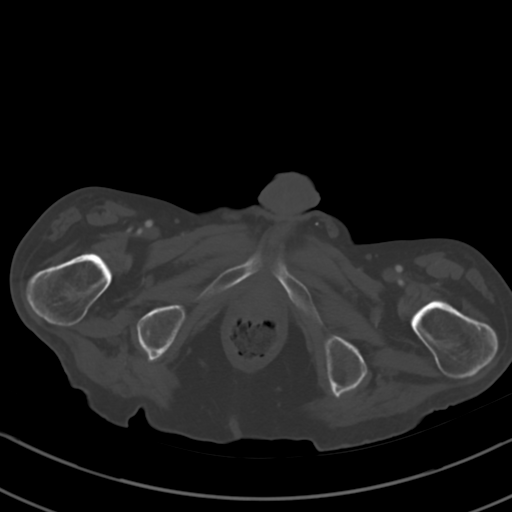
[im 25/135  soft-tissue]
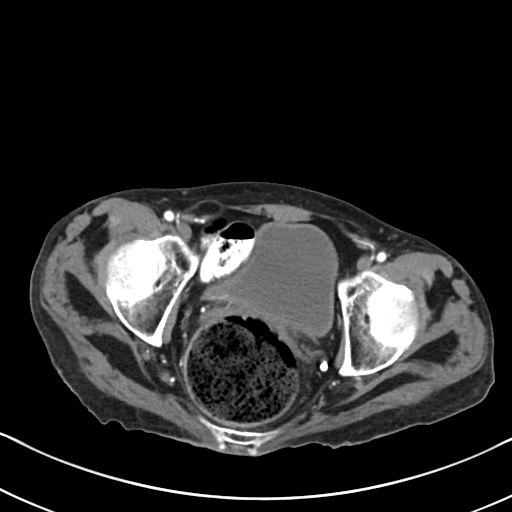
[im 49/135  soft-tissue]
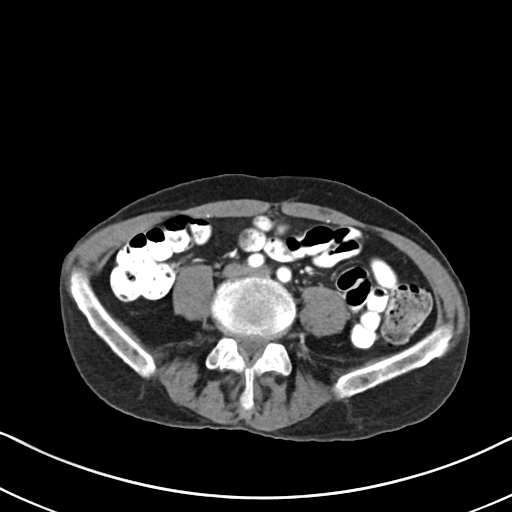
[im 61/135  soft-tissue]
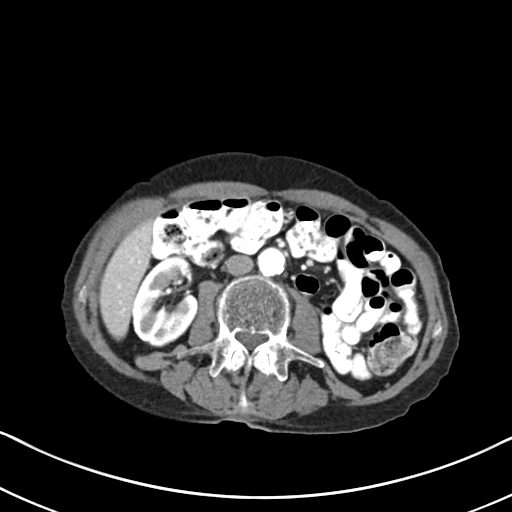
[im 74/135  soft-tissue]
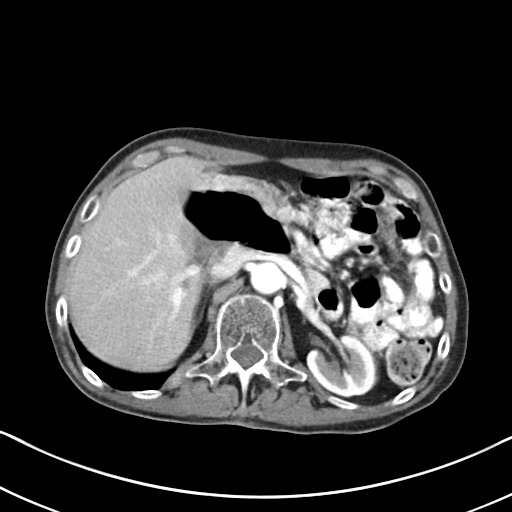
[im 86/135  soft-tissue]
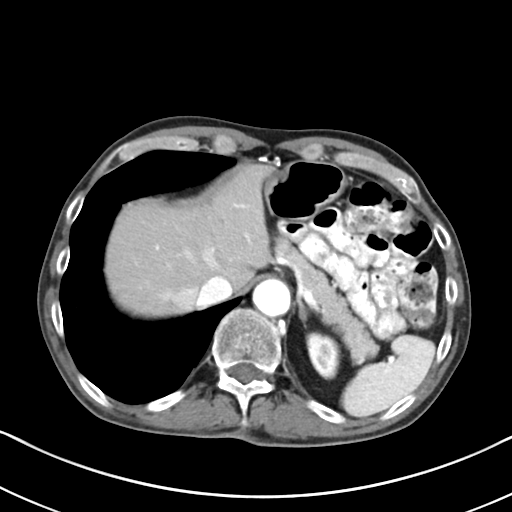
[im 110/135  soft-tissue]
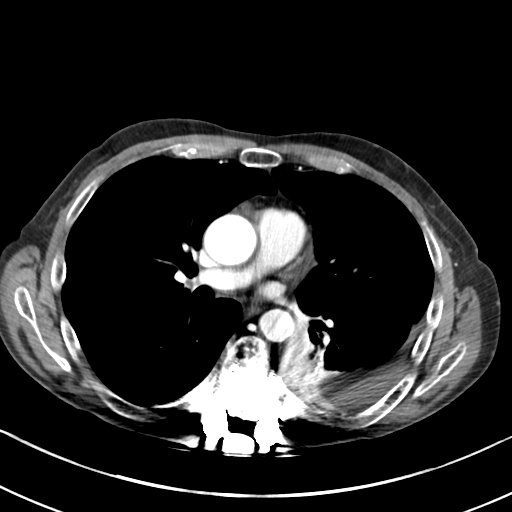
[im 122/135  soft-tissue]
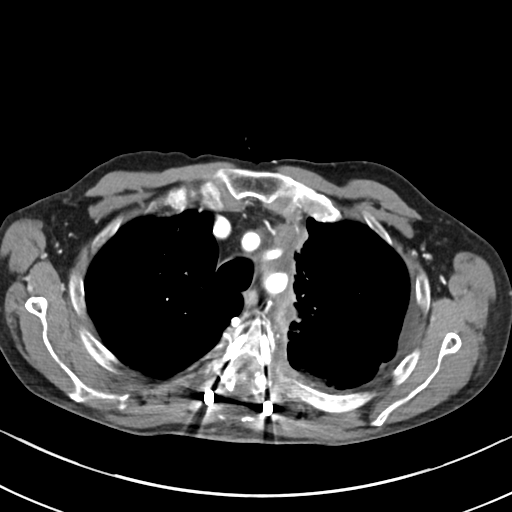

[Series 6: coronal · coronal · 0.73mm/px · 3 of 80 slices shown]
[im 27/80  soft-tissue]
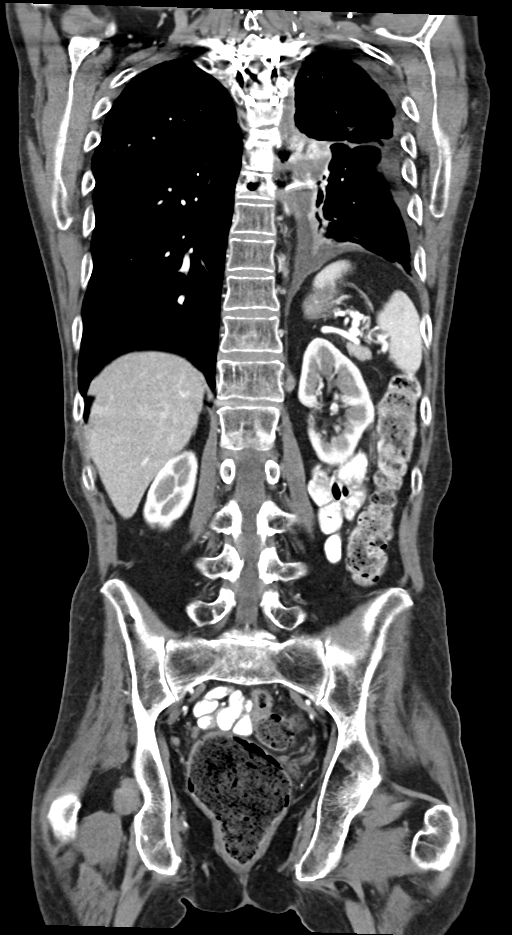
[im 36/80  soft-tissue]
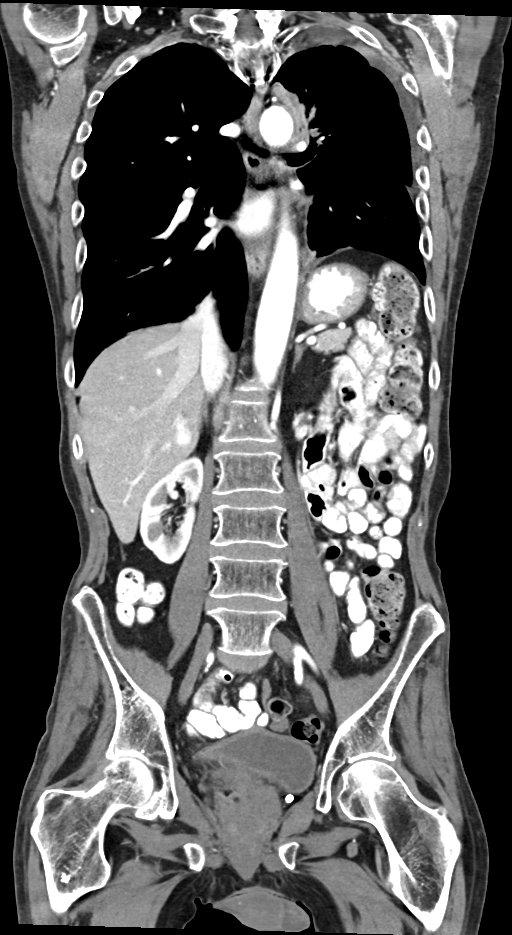
[im 44/80  soft-tissue]
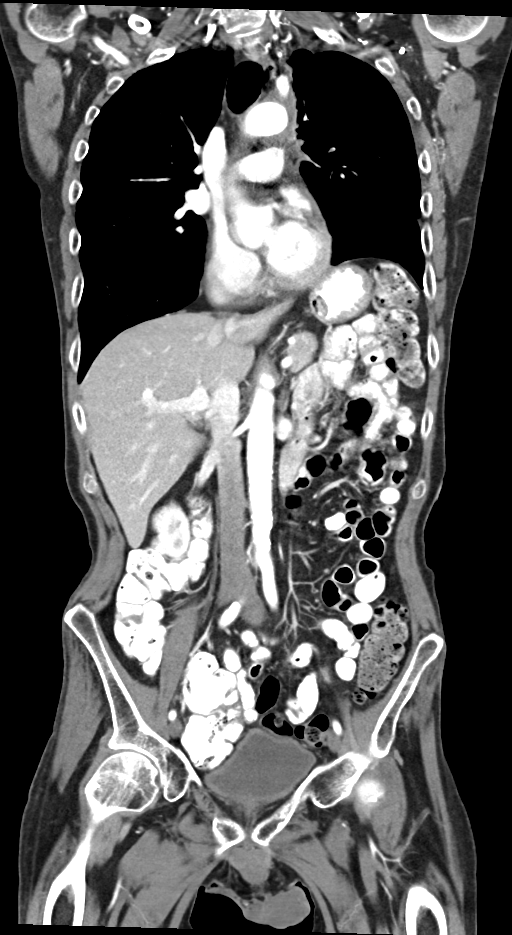

[11 of 46 positions shown; findings below may reference images not displayed]

FINDINGS: CT CHEST FINDINGS

Cardiovascular: Mild atherosclerotic calcification of the aortic
arch. Small although improved caliber of the left pulmonary artery
possibly from surrounding tumor and/or vasoconstriction related to
hypoaeration.

Mediastinum/Nodes: Hypodense tumor invasion of the left side of the
mediastinum along a substantial swath of the pleural margin, with
tumor surrounding portions of the left subclavian and left common
carotid artery and surrounding about 180 degrees of the arc of the
aortic arch, with anterior mediastinal extension continuing as on
image 18 series 3, and with tumor surrounding the left pulmonary
artery and left mainstem bronchus. However, the thickness of the
rind of pleural and mediastinal tumor is substantially reduced
compared to [DATE], for example about 0.8 cm adjacent to the
aortic arch compared to previous 2.1 cm. Separate from the tumor I
do not see well-defined adenopathy in the mediastinum.

Lungs/Pleura: Scarring in the right upper lobe noted. Clustered
ground-glass and tree-in-bud nodularity anteriorly in the right
lower lobe on image 82 of series 5 is new and probably from atypical
infectious bronchiolitis. Similar findings with associated airway
plugging noted anteriorly in the right lower lobe on image 105 of
series 5.

There is some subsegmental atelectasis in the right lower lobe along
with a superior segment right lower lobe subpleural irregular nodule
measuring 1.3 by 1.0 cm on image 52 of series 5, previously 0.9 by
0.8 cm.

Volume loss medially in the left upper lobe with associated airway
plugging and airway thickening, with some sparing of the lingula.
Moderate left pleural effusion. Prior ground-glass opacities in the
left upper lobe have improved. The pleural effusion is moderate but
slightly improved from prior. Atelectasis in the left lower lobe is
mildly improved. Tethering of part of the atelectatic left lower
lobe to the parietal pleural surface is suspected on images 27
through 30 of series [DATE] by 0.5 cm enhancing nodule along the parietal pleural surface in
the left costophrenic angle on image 41 of series 3, suspicious for
tumor and increased in conspicuity from prior.

Musculoskeletal: Posterolateral rod and pedicle screw fixation at
T3-T4-T6-T7 bilaterally with stable pathologic burst compression at
T5, which has about 5 mm of posterior bony retropulsion. Posterior
decompression at T4 through T6. There is some sclerosis in the T5
vertebral body. There is also some interval mild compression at T7
with 30% loss of vertebral body height on image 60 of series 7.

Suspected right eccentric hemangioma at T12.

Chronic bony destruction of the left fifth rib medially, with
irregularity and some erosion of the left sixth rib medially as well
related to prior tumor, mostly similar to the prior exam.

The left paraspinal tumor spanning from the T4 level down through T8
is somewhat reduced compared to prior, measuring about 3.8 cm
transverse on image 22 of series 3 compared to prior 4.8 cm.

The left T5 and T6 pedicle screws capture the lateral cortex of the
associated vertebral bodies.

It is difficult to characterize the spinal canal in the region of
involvement due to streak artifact from the hardware.

Callus formation associated with healing fracture of the right
anterior fourth rib.

Substantially elevated left hemidiaphragm.

CT ABDOMEN PELVIS FINDINGS

Hepatobiliary: Contracted gallbladder. Mildly blurred by motion
artifact. 1.6 by 1.4 cm enhancing lesion in the right hepatic lobe
on image 64 of series 3 has density similar to the adjacent portal
vein on portal venous and delayed phase images. This would be an
atypical appearance for a metastatic lesion but could represent a
portal vein varix or hemangioma. Questionable small bandlike lucency
along the upper margin of this lesion.

Pancreas: Unremarkable

Spleen: Unremarkable

Adrenals/Urinary Tract: Unremarkable

Stomach/Bowel: Large but not inflamed proximal transverse duodenal
diverticulum. Prominent stool throughout the colon favors
constipation. Prominence of stool in the rectal vault measuring
by 7.4 cm, no definite perirectal inflammatory findings to
definitively suggest stercoral colitis, but fecal impaction is not
excluded.

Vascular/Lymphatic: Atherosclerosis is present, including aortoiliac
atherosclerotic disease. No pathologic adenopathy identified.

Reproductive: Unremarkable

Other: No supplemental non-categorized findings.

Musculoskeletal: Small likely benign chondroid lesion of the
proximal right femoral metaphysis, image 129 series 3.

The small enhancing lesion inferiorly in the right L5 vertebral body
shown on prior MRI is not well seen on today's CT.
IMPRESSION: 1. The large mass of the left pleura, mediastinum, and thoracic
paraspinal region is reduced in size compared to prior. Interval
posterolateral rod and pedicle screw fixation at T3-T4-T6-T7 with
posterior decompression at T4 through T6.
2. New 30% compression fracture at T7.
3. Pleural nodule along the left posterior costophrenic angle. There
is some tethering of the atelectatic portion of the left lower lobe
to the parietal pleural margin.
4. Similar destructive findings of the left fifth rib and to a
lesser extent the left sixth rib. There is also callus formation
associated with a healing fracture the right anterior fourth rib.
5. Substantially elevated left hemidiaphragm.
6. Minimally improved moderate left pleural effusion.
7. Mild enlargement of a superior segment right lower lobe pulmonary
nodule, currently 1.3 by 1.0 cm.
8. Enhancing lesion in the right hepatic lobe is not typical for
metastatic disease and probably represents either a portal vein
varix or hemangioma. Surveillance suggested.
9. Prominent stool throughout the colon favors constipation.
Prominent stool in rectal vault, fecal impaction not excluded.
10. Clustered suspected atypical infectious bronchiolitis in the
right lower lobe.
11. Other imaging findings of potential clinical significance:
Aortic Atherosclerosis ([6Y]-[6Y]). Improved caliber of the left
pulmonary artery. Hemangioma at T12. Transverse duodenal
diverticulum. Small benign-appearing chondroid lesion of the right
proximal femoral metaphysis.

## 2021-05-31 MED ORDER — IOHEXOL 300 MG/ML  SOLN
80.0000 mL | Freq: Once | INTRAMUSCULAR | Status: AC | PRN
  Administered 2021-05-31: 75 mL via INTRAVENOUS

## 2021-05-31 NOTE — Progress Notes (Signed)
Location:  [facility] Room Number: 195 A Place of Service:  SNF (31) Provider:  Durenda Age, DNP, FNP-BC  Patient Care Team: [facility] as PCP - General (Internal Medicine)  Extended Emergency Contact Information Primary Emergency Contact: [place], [place] Mobile Phone: [phone] Relation: Brother Secondary Emergency Contact: [place] Phone: [phone] Relation: Mother  Code Status:  DNR  Goals of care: Advanced Directive information Advanced Directives 05/07/2021  Does Patient Have a Medical Advance Directive? Yes  Type of Paramedic of [place];Living will;Out of facility DNR (pink MOST or yellow form)  Does patient want to make changes to medical advance directive? No - Patient declined  Copy of [place] in Chart? Yes - validated most recent copy scanned in chart (See row information)     Chief Complaint  Patient presents with   Medical Management of Chronic Issues    Routine Visit    HPI:  Pt is a 66 y.o. male seen today for medical management of chronic diseases. He is now a long-term care resident of [facility]. 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  Protein-calorie malnutrition, severe -  current weight is 109.6 lbs, stable, takes Ensure Enlive 4 times a day and Magic cup along 4 times a day for supplementation  Oropharyngeal dysphagia -  currently on pure/thin liquid, no reported aspiration episode  Squamous cell carcinoma of right tonsil (HCC) Bone metastases ([place]) -  completed a course of palliative radiation to the thoracic spine and chest on 05/13/21   Past Medical History:  Diagnosis Date   Blood in stool    Cancer ([place]) 08/05/14   right tonsil, P16 positive   Past Surgical History:  Procedure Laterality  Date   EYE SURGERY     KNEE SURGERY     LUMBAR PERCUTANEOUS PEDICLE SCREW 4 LEVEL N/A 04/02/2021   Procedure: THORACIC THREE-THORACIC SEVEN, INSTRUMENTATION AND FUSION, THORACIC FOUR-THORACIC SIX LAMINECTOMY, THORACIC FIVE TRANSPEDICULAR DECOMPRESSION;  Surgeon: Dawley, Theodoro Doing, DO;  Location: [facility];  Service: Neurosurgery;  Laterality: N/A;   tonsil biopsy Right 08/05/14   invasive squamous cell carcinoma    No Known Allergies  Outpatient Encounter Medications as of 05/31/2021  Medication Sig   bisacodyl (DULCOLAX) 10 MG suppository Place 10 mg rectally as needed for moderate constipation.   dexamethasone (DECADRON) 2 MG tablet Take 2mg  BID. Starting on 05/23/21, taper to 2mg  daily. Starting on 05/30/21, taper to 1mg  (half tablet) daily. Last dose on 06/12/21.   Ensure Plus (ENSURE PLUS) LIQD Take 237 mLs by mouth in the morning, at noon, and at bedtime.   levothyroxine (SYNTHROID) 50 MCG tablet Take 50 mcg by mouth daily.   lidocaine (LIDODERM) 5 % Place 1 patch onto the skin daily as needed (pain). (Patient not taking: Reported on 05/10/2021)   lidocaine (XYLOCAINE) 2 % solution Use as directed 10 mLs in the mouth or throat as needed for mouth pain. (Patient not taking: Reported on 05/10/2021)   LORazepam (ATIVAN) 0.5 MG tablet Take 1 tablet (0.5 mg total) by mouth daily as needed for anxiety.   Magnesium Hydroxide (MILK OF MAGNESIA PO) Take 30 mLs by mouth as needed.   Multiple Vitamins-Minerals (MULTIVITAMIN ADULT) TABS Take 1 tablet by mouth daily. with Omega 3   polyethylene glycol (MIRALAX / GLYCOLAX) 17 g packet Take 17 g by mouth daily  as needed for mild constipation.   Sodium Phosphates (RA SALINE ENEMA RE) Place 1 Dose rectally as needed.   No facility-administered encounter medications on file as of 05/31/2021.    Review of Systems  Constitutional:  Negative for chills and fever.  HENT:  Negative for sore throat.   Eyes: Negative.   Cardiovascular:  Negative for chest pain and leg  swelling.  Gastrointestinal:  Negative for abdominal distention, diarrhea and vomiting.  Genitourinary:  Negative for dysuria, frequency and urgency.  Skin:  Negative for color change.  Neurological:  Negative for dizziness and headaches.  Psychiatric/Behavioral:  Negative for behavioral problems and sleep disturbance. The patient is not nervous/anxious.       Immunization History  Administered Date(s) Administered   Janssen (J&J) SARS-COV-2 Vaccination 08/31/2019   PPD Test 09/14/2017   Unspecified SARS-COV-2 Vaccination 09/21/2019   Pertinent  Health Maintenance Due  Topic Date Due   COLONOSCOPY (Pts 45-19yrs Insurance coverage will need to be confirmed)  Never done   INFLUENZA VACCINE  Never done   Fall Risk 04/08/2021 04/08/2021 04/09/2021 04/20/2021 05/10/2021  Patient Fall Risk Level High fall risk High fall risk High fall risk High fall risk High fall risk     Vitals:   05/31/21 1509  BP: 128/74  Pulse: 76  Resp: 18  Temp: 97.6 F (36.4 C)  Weight: 109 lb 9.6 oz (49.7 kg)  Height: 5\' 10"  (1.778 m)   Body mass index is 15.73 kg/m.  Physical Exam HENT:     Head: Normocephalic and atraumatic.     Mouth/Throat:     Mouth: Mucous membranes are moist.  Eyes:     Conjunctiva/sclera: Conjunctivae normal.  Cardiovascular:     Rate and Rhythm: Normal rate and regular rhythm.     Pulses: Normal pulses.     Heart sounds: Normal heart sounds.  Pulmonary:     Effort: Pulmonary effort is normal.     Breath sounds: Normal breath sounds.  Abdominal:     General: Abdomen is flat.     Palpations: Abdomen is soft.  Musculoskeletal:        General: No swelling. Normal range of motion.     Cervical back: Normal range of motion.  Skin:    General: Skin is warm and dry.  Neurological:     Mental Status: He is alert and oriented to person, place, and time.  Psychiatric:        Mood and Affect: Mood normal.        Behavior: Behavior normal.       Labs  reviewed: Recent Labs    04/01/21 1912 04/02/21 2341 04/04/21 0340 04/14/21 0000 04/21/21 0000 04/23/21 0000 05/31/21 1155  NA 137   < > 133*   < > 135* 135* 136  K 3.4*   < > 3.8   < > 3.7 4.2 3.8  CL 101  --  101   < > 100 96* 98  CO2 27  --  25   < > 22 28* 29  GLUCOSE 97  --  148*  --   --   --  86  BUN 20  --  17   < > 32* 28* 31*  CREATININE 0.72   < > 0.69   < > 0.5* 0.6 0.68  CALCIUM 9.3  --  8.5*   < > 8.6* 9.0 8.9   < > = values in this interval not displayed.   Recent Labs  04/01/21 1912 04/04/21 0340 05/31/21 1155  AST 27 31 22   ALT 11 11 23   ALKPHOS 82 58 45  BILITOT 1.2 0.8 0.7  PROT 7.2 5.2* 5.9*  ALBUMIN 4.2 3.0* 3.6   Recent Labs    04/01/21 1912 04/02/21 2341 04/03/21 0225 04/04/21 0340 04/14/21 0000 04/21/21 0000 04/23/21 0000 05/31/21 1155  WBC 6.6  --  10.6* 11.7*   < > 11.2 9.8 7.3  NEUTROABS 5.4  --   --   --   --   --   --  5.9  HGB 14.0   < > 12.8* 12.2*   < > 12.4* 13.3* 14.1  HCT 40.9   < > 37.1* 35.8*   < > 37* 39* 42.8  MCV 88.1  --  86.7 85.6  --   --   --  91.8  PLT 274  --  205 224   < > 221 214 191   < > = values in this interval not displayed.   Lab Results  Component Value Date   TSH 1.47 09/11/2020   No results found for: HGBA1C Lab Results  Component Value Date   CHOL 164 09/11/2020   HDL 48 09/11/2020   LDLCALC 95 09/11/2020   TRIG 118 09/11/2020   CHOLHDL 3.4 09/11/2020    Significant Diagnostic Results in last 30 days:  No results found.  Assessment/Plan  1. Protein-calorie malnutrition, severe Wt Readings from Last 3 Encounters:  05/31/21 109 lb 9.6 oz (49.7 kg)  05/10/21 112 lb 2 oz (50.9 kg)  05/07/21 112 lb 12.8 oz (51.2 kg)   -Continue supplementations  2. Oropharyngeal dysphagia -  Continue pure diet with thin liquids -   Aspiration precautions  3. 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 - 5:00 p.m.) [phone] (after hours)

## 2021-06-04 ENCOUNTER — Inpatient Hospital Stay: Payer: Medicare PPO | Admitting: Oncology

## 2021-06-04 ENCOUNTER — Other Ambulatory Visit (HOSPITAL_COMMUNITY): Payer: Self-pay

## 2021-06-04 ENCOUNTER — Other Ambulatory Visit: Payer: Self-pay

## 2021-06-04 ENCOUNTER — Inpatient Hospital Stay: Payer: Medicare PPO | Admitting: Nurse Practitioner

## 2021-06-04 VITALS — BP 104/66 | HR 99 | Temp 98.0°F | Resp 19 | Ht 70.0 in | Wt 107.0 lb

## 2021-06-04 DIAGNOSIS — C7951 Secondary malignant neoplasm of bone: Secondary | ICD-10-CM | POA: Diagnosis present

## 2021-06-04 DIAGNOSIS — C099 Malignant neoplasm of tonsil, unspecified: Secondary | ICD-10-CM | POA: Diagnosis present

## 2021-06-04 DIAGNOSIS — C7802 Secondary malignant neoplasm of left lung: Secondary | ICD-10-CM | POA: Diagnosis not present

## 2021-06-04 DIAGNOSIS — J90 Pleural effusion, not elsewhere classified: Secondary | ICD-10-CM | POA: Diagnosis not present

## 2021-06-04 MED ORDER — CAPECITABINE 500 MG PO TABS
ORAL_TABLET | ORAL | 0 refills | Status: DC
  Filled 2021-06-04: qty 70, fill #0

## 2021-06-04 NOTE — Progress Notes (Signed)
**Bradley Bradley** Bradley Bradley   Diagnosis: Head neck cancer  INTERVAL HISTORY:   Bradley Bradley returns as scheduled.  He continues to reside in a skilled nursing facility.  He is performing physical therapy.  He reports making progress with ambulation.  No pain.  Leg strength and numbness have improved. He saw Bradley Bradley for a telehealth visit 05/12/2021.  He recommends considering palliative chemotherapy with a platinum/taxane, or oral Xeloda. Bradley Bradley would like to begin a trial of Xeloda.   Objective:  Vital signs in last 24 hours:  Blood pressure 104/66, pulse 99, temperature 98 F (36.7 C), temperature source Oral, resp. rate 19, height 5' 10"  (1.778 m), weight 107 lb (48.5 kg), SpO2 98 %.    HEENT: No thrush Lymphatics: No cervical, supraclavicular, axillary, or inguinal nodes Resp: Lungs clear bilaterally Cardio: Regular rate and rhythm GI: No hepatosplenomegaly Vascular: No leg edema Neuro: The motor exam appears intact in the upper and lower extremities bilaterally, he is able to transfer from a wheelchair to the exam table without assistance   Portacath/PICC-without erythema  Lab Results:  Lab Results  Component Value Date   WBC 7.3 05/31/2021   HGB 14.1 05/31/2021   HCT 42.8 05/31/2021   MCV 91.8 05/31/2021   PLT 191 05/31/2021   NEUTROABS 5.9 05/31/2021    CMP  Lab Results  Component Value Date   NA 136 05/31/2021   K 3.8 05/31/2021   CL 98 05/31/2021   CO2 29 05/31/2021   GLUCOSE 86 05/31/2021   BUN 31 (H) 05/31/2021   CREATININE 0.68 05/31/2021   CALCIUM 8.9 05/31/2021   PROT 5.9 (L) 05/31/2021   ALBUMIN 3.6 05/31/2021   AST 22 05/31/2021   ALT 23 05/31/2021   ALKPHOS 45 05/31/2021   BILITOT 0.7 05/31/2021   GFRNONAA >60 05/31/2021   GFRAA >90 04/23/2021    No results found for: CEA1, CEA, CAN199, CA125  No results found for: INR, LABPROT  Imaging:  CT SOFT TISSUE NECK W CONTRAST  Result Date: 06/01/2021 CLINICAL  DATA:  Restaging head neck cancer EXAM: CT NECK WITH CONTRAST TECHNIQUE: Multidetector CT imaging of the neck was performed using the standard protocol following the bolus administration of intravenous contrast. CONTRAST:  7m OMNIPAQUE IOHEXOL 300 MG/ML  SOLN COMPARISON:  08/01/2014 FINDINGS: Pharynx and larynx: Post treatment mucosal spaces with no evidence of tumor or swelling. Salivary glands: Resected right submandibular gland. No active disease Thyroid: Symmetric small gland Lymph nodes: None enlarged or abnormal density. Vascular: Post treatment occluded segments of the bilateral IJ, superiorly on the right and inferiorly on the left. Limited intracranial: Negative Visualized orbits: Negative Mastoids and visualized paranasal sinuses: Clear Skeleton: No acute or destructive findings in the head and cervical spine. Post treatment thoracic spine described on dedicated chest CT. Upper chest: Described on dedicated CT. IMPRESSION: 1. No evidence of residual or recurrent disease in the neck. 2. Chest reported separately. Electronically Signed   By: JJorje GuildM.D.   On: 06/01/2021 03:59   CT CHEST ABDOMEN PELVIS W CONTRAST  Result Date: 06/01/2021 CLINICAL DATA:  Tonsillar cancer with left thoracic spinal and paraspinal metastatic disease causing prior cord compression and with left lung involvement. EXAM: CT CHEST, ABDOMEN, AND PELVIS WITH CONTRAST TECHNIQUE: Multidetector CT imaging of the chest, abdomen and pelvis was performed following the standard protocol during bolus administration of intravenous contrast. CONTRAST:  756mOMNIPAQUE IOHEXOL 300 MG/ML  SOLN COMPARISON:  Multiple exams, including 04/02/2021 FINDINGS: CT  CHEST FINDINGS Cardiovascular: Mild atherosclerotic calcification of the aortic arch. Small although improved caliber of the left pulmonary artery possibly from surrounding tumor and/or vasoconstriction related to hypoaeration. Mediastinum/Nodes: Hypodense tumor invasion of the left  side of the mediastinum along a substantial swath of the pleural margin, with tumor surrounding portions of the left subclavian and left common carotid artery and surrounding about 180 degrees of the arc of the aortic arch, with anterior mediastinal extension continuing as on image 18 series 3, and with tumor surrounding the left pulmonary artery and left mainstem bronchus. However, the thickness of the rind of pleural and mediastinal tumor is substantially reduced compared to 04/02/2021, for example about 0.8 cm adjacent to the aortic arch compared to previous 2.1 cm. Separate from the tumor I do not see well-defined adenopathy in the mediastinum. Lungs/Pleura: Scarring in the right upper lobe noted. Clustered ground-glass and tree-in-bud nodularity anteriorly in the right lower lobe on image 82 of series 5 is new and probably from atypical infectious bronchiolitis. Similar findings with associated airway plugging noted anteriorly in the right lower lobe on image 105 of series 5. There is some subsegmental atelectasis in the right lower lobe along with a superior segment right lower lobe subpleural irregular nodule measuring 1.3 by 1.0 cm on image 52 of series 5, previously 0.9 by 0.8 cm. Volume loss medially in the left upper lobe with associated airway plugging and airway thickening, with some sparing of the lingula. Moderate left pleural effusion. Prior ground-glass opacities in the left upper lobe have improved. The pleural effusion is moderate but slightly improved from prior. Atelectasis in the left lower lobe is mildly improved. Tethering of part of the atelectatic left lower lobe to the parietal pleural surface is suspected on images 27 through 30 of series 3. 2.0 by 0.5 cm enhancing nodule along the parietal pleural surface in the left costophrenic angle on image 41 of series 3, suspicious for tumor and increased in conspicuity from prior. Musculoskeletal: Posterolateral rod and pedicle screw fixation at  T3-T4-T6-T7 bilaterally with stable pathologic burst compression at T5, which has about 5 mm of posterior bony retropulsion. Posterior decompression at T4 through T6. There is some sclerosis in the T5 vertebral body. There is also some interval mild compression at T7 with 30% loss of vertebral body height on image 60 of series 7. Suspected right eccentric hemangioma at T12. Chronic bony destruction of the left fifth rib medially, with irregularity and some erosion of the left sixth rib medially as well related to prior tumor, mostly similar to the prior exam. The left paraspinal tumor spanning from the T4 level down through T8 is somewhat reduced compared to prior, measuring about 3.8 cm transverse on image 22 of series 3 compared to prior 4.8 cm. The left T5 and T6 pedicle screws capture the lateral cortex of the associated vertebral bodies. It is difficult to characterize the spinal canal in the region of involvement due to streak artifact from the hardware. Callus formation associated with healing fracture of the right anterior fourth rib. Substantially elevated left hemidiaphragm. CT ABDOMEN PELVIS FINDINGS Hepatobiliary: Contracted gallbladder. Mildly blurred by motion artifact. 1.6 by 1.4 cm enhancing lesion in the right hepatic lobe on image 64 of series 3 has density similar to the adjacent portal vein on portal venous and delayed phase images. This would be an atypical appearance for a metastatic lesion but could represent a portal vein varix or hemangioma. Questionable small bandlike lucency along the upper margin of this  lesion. Pancreas: Unremarkable Spleen: Unremarkable Adrenals/Urinary Tract: Unremarkable Stomach/Bowel: Large but not inflamed proximal transverse duodenal diverticulum. Prominent stool throughout the colon favors constipation. Prominence of stool in the rectal vault measuring 7.7 by 7.4 cm, no definite perirectal inflammatory findings to definitively suggest stercoral colitis, but fecal  impaction is not excluded. Vascular/Lymphatic: Atherosclerosis is present, including aortoiliac atherosclerotic disease. No pathologic adenopathy identified. Reproductive: Unremarkable Other: No supplemental non-categorized findings. Musculoskeletal: Small likely benign chondroid lesion of the proximal right femoral metaphysis, image 129 series 3. The small enhancing lesion inferiorly in the right L5 vertebral body shown on prior MRI is not well seen on today's CT. IMPRESSION: 1. The large mass of the left pleura, mediastinum, and thoracic paraspinal region is reduced in size compared to prior. Interval posterolateral rod and pedicle screw fixation at T3-T4-T6-T7 with posterior decompression at T4 through T6. 2. New 30% compression fracture at T7. 3. Pleural nodule along the left posterior costophrenic angle. There is some tethering of the atelectatic portion of the left lower lobe to the parietal pleural margin. 4. Similar destructive findings of the left fifth rib and to a lesser extent the left sixth rib. There is also callus formation associated with a healing fracture the right anterior fourth rib. 5. Substantially elevated left hemidiaphragm. 6. Minimally improved moderate left pleural effusion. 7. Mild enlargement of a superior segment right lower lobe pulmonary nodule, currently 1.3 by 1.0 cm. 8. Enhancing lesion in the right hepatic lobe is not typical for metastatic disease and probably represents either a portal vein varix or hemangioma. Surveillance suggested. 9. Prominent stool throughout the colon favors constipation. Prominent stool in rectal vault, fecal impaction not excluded. 10. Clustered suspected atypical infectious bronchiolitis in the right lower lobe. 11. Other imaging findings of potential clinical significance: Aortic Atherosclerosis (ICD10-I70.0). Improved caliber of the left pulmonary artery. 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 Small benign-appearing chondroid lesion of  the right proximal femoral metaphysis. Electronically Signed   By: Van Clines M.D.   On: 06/01/2021 07:47    Medications: I have reviewed the patient's current medications.   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, stage IVa (T2N2bM0) HPV positive 08/2014-09/2014-CRT 70 Gray in 35 fractions with cisplatin 20 mg per metered squared days 1-5, weeks 1 and 5 Recurrent disease April 2019-new left upper lobe cavitary lesion, infiltrative left hilar mass, prevascular and mediastinal adenopathy 09/28/2017-flexible bronchoscopy with EBUS-biopsy station 7 node, squamous cell carcinoma p16 positive 10/2017-12/2018-systemic therapy, carboplatin/docetaxel/pembrolizumab x5 followed by maintenance pembrolizumab, progression in 06/2018, 09/2018, and 12/2018 while on pembrolizumab-continue due to slow mild progression 07/13/2018-disease progression with new left upper lobe posterior segment lesion, new left upper lobe nodule, new left apical nodularity with subsequent scans 10/05/2018 and 01/11/2019 confirming disease progression 10/30/2020-PET add [facility] posterior mediastinal mass involving T5 and T6 and left posterior fifth and sixth ribs, hypermetabolic soft tissue within the prevascular mediastinum contiguous with the posterior mediastinal mass, hypermetabolic spiculated left upper lobe nodules, small left pleural effusion, focal small bowel uptake in left upper quadrant abutting the transverse colon Leg weakness and lower body numbness beginning 03/24/2021, fall prior to presenting to the emergency room 04/01/2021 MRI cervical, thoracic, and lumbar spine level 04/02/2021-no evidence of metastatic disease of the cervical spine, 8 mm enhancing lesion in the inferior aspect of L5-nonspecific, no other evidence of metastatic disease in the lumbar spine, large destructive and infiltrative metastatic deposit centered  at the left paraspinous region involving T4-T7  with associated epidural extension with severe spinal stenosis, cord compression, cord signal changes at T4-5 through T6-7, metastatic lesion extends to involve the posterior medial aspect of the left lung and left mediastinum/hilar region, pathologic fracture of T5, 1 cm enhancing lesion in T11 indeterminate CTs 05/31/2021-no evidence of recurrent disease in the neck, decreased size of left pleural/mediastinal and thoracic paraspinal mass, minimally improved left pleural effusion, mild enlargement of a superior segment right lower lobe nodule, destructive findings of the left fifth and sixth ribs.  Callus formation associated with a healing fracture of the right anterior fourth rib.   2.  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  He has been maintained off of systemic therapy since 2020.  He presented with a thoracic spine cord compression in November 22 and underwent decompression surgery.  This was followed by palliative radiation to the thoracic spine and chest completed on 04/29/2021. The restaging CT at this week reveals evidence of residual disease at the left pleural/mediastinum and left paraspinous regions.  There may be a nodule in the right lower lung.  There is no other evidence of distant metastatic disease.  We discussed treatment options including observation and a trial of salvage systemic therapy.  Bradley Bradley would like to proceed with capecitabine.  We reviewed potential toxicities associated with capecitabine including the chance of nausea, mucositis, diarrhea,  and hematologic toxicity.  We discussed the rash, sun sensitivity, hyperpigmentation, and hand/foot syndrome associated with capecitabine.  He will receive additional teaching from the cancer center pharmacist.  He agrees to proceed.  Bradley Bradley understands it may be difficult to judge a response to therapy given the recent surgery/radiation and lack of easily measurable disease.  The plan is to begin capecitabine 06/14/2021.  He will return for an office and lab visit 07/02/2021.  The plan is to begin capecitabine 06/14/2020.   Betsy Coder, MD  06/04/2021  8:53 AM

## 2021-06-07 ENCOUNTER — Other Ambulatory Visit (HOSPITAL_COMMUNITY): Payer: Self-pay

## 2021-06-07 ENCOUNTER — Telehealth: Payer: Self-pay | Admitting: Pharmacist

## 2021-06-07 ENCOUNTER — Telehealth: Payer: Self-pay | Admitting: Pharmacy Technician

## 2021-06-07 DIAGNOSIS — C099 Malignant neoplasm of tonsil, unspecified: Secondary | ICD-10-CM

## 2021-06-07 MED ORDER — CAPECITABINE 500 MG PO TABS: ORAL_TABLET | ORAL | 0 refills | Status: DC

## 2021-06-07 NOTE — Telephone Encounter (Signed)
Oral Oncology Pharmacist Encounter  Received new prescription for Xeloda (capecitabine) for the treatment of squamous cell carcinoma the right tonsil, planned duration until disease progression or unacceptable drug toxicity. Planned start 06/14/21  CMP from 05/31/21 assessed, no relevant lab abnormalities. Prescription dose and frequency assessed.   Current medication list in Epic reviewed, no relevant DDIs with capecitabine identified.  Evaluated chart and no patient barriers to medication adherence identified.   Prescription has been e-scribed to the [facility] for benefits analysis and approval.  Oral Oncology Clinic will continue to follow for insurance authorization, copayment issues, initial counseling and start date.  Patient agreed to treatment on 06/04/21 per MD documentation.  Darl Pikes, PharmD, BCPS, BCOP, CPP Hematology/Oncology Clinical Pharmacist Practitioner [place]/DB/AP [facility] [phone]  06/07/2021 3:05 PM

## 2021-06-07 NOTE — Telephone Encounter (Signed)
Oral Oncology Patient Advocate Encounter   Received notification from [facility] that prior authorization for Xeloda is required.   PA submitted on CoverMyMeds Key [id number] Status is pending   Oral Oncology Clinic will continue to follow.  [place] Patient [place] Phone [phone] Fax [phone] 06/07/2021 12:24 PM

## 2021-06-07 NOTE — Telephone Encounter (Signed)
Oral Oncology Patient Advocate Encounter  Prior Authorization for Xeloda has been approved under part B benefit.  PA# [id number] Effective dates: 06/07/21 through 05/22/22  Patients co-pay is $50.00.  Oral Oncology Clinic will continue to follow.   [place] Patient Bradley Oliver Phone [phone] Fax [phone] 06/07/2021 3:09 PM

## 2021-06-08 ENCOUNTER — Encounter: Payer: Self-pay | Admitting: *Deleted

## 2021-06-08 NOTE — Telephone Encounter (Signed)
Rx was sent to [facility] in [place] on 06/07/21.  Called this morning to check status and med was delivered to [facility] on 06/07/21.   [name] Phone [phone] Fax [phone] 06/08/2021 10:00 AM

## 2021-06-08 NOTE — Progress Notes (Signed)
Spoke with [facility] to submit Scans and Dr [name] note from 06/04/21 for Dr Nori Riis Ready to review.  He will review and contact Dr [name] for recommendations

## 2021-06-10 NOTE — Telephone Encounter (Signed)
Oral Chemotherapy Pharmacist Encounter  Capecitabine filled by [facility] (the dispensing pharmacy to [facility]) and delivered to [place].   Patient Education I spoke with patient for overview of new oral chemotherapy medication: Xeloda (capecitabine) for the treatment of squamous cell carcinoma the right tonsil, planned duration until disease progression or unacceptable drug toxicity. Planned start 06/14/21.   Counseled patient on administration, dosing, side effects, monitoring, drug-food interactions, safe handling, storage, and disposal. Patient will take 3 tablets (1500mg ) by mouth in AM and 2 tablets (1000mg ) in PM. Take with food. Take for 14 days, then hold for 7 day. Repeat every 21 days.  Side effects include but not limited to: diarrhea, hand-foot syndrome, mouth sores, edema, decreased wbc, fatigue, N/V Diarrhea: Spoke with [facility] RN who stated that they keep loperamide on hand and can give it to Bradley Oliver if he reports diarrhea. Bradley Oliver knows the office needs to know if he is having 4 or more loose stools per day. Hand-foot syndrome: Reminded the patient to keep his hands and feet moisturized while on the capecitabine Mouth sores: Patient will call the office to obtain magic mouthwash if needed    Reviewed with patient importance of keeping a medication schedule and plan for any missed doses.  After discussion with patient no patient barriers to medication adherence identified.   Bradley Oliver voiced understanding and appreciation. All questions answered. Medication handout mailed to [name].  Provided patient with Oral [place] [facility] phone number. Patient knows to call the office with questions or concerns. Oral Chemotherapy Navigation Clinic will continue to follow.  Darl Pikes, PharmD, BCPS, BCOP, CPP Hematology/Oncology Clinical Pharmacist Practitioner [place]/DB/AP Oral [place] [facility] [phone]  06/10/2021  11:38 AM

## 2021-06-11 ENCOUNTER — Other Ambulatory Visit: Payer: Self-pay | Admitting: *Deleted

## 2021-06-11 MED ORDER — PROCHLORPERAZINE MALEATE 10 MG PO TABS: 10.0000 mg | ORAL_TABLET | Freq: Four times a day (QID) | ORAL | 1 refills | Status: DC | PRN

## 2021-06-11 NOTE — Progress Notes (Signed)
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   Plan: The patient will follow-up with radiation oncology in 45mo .  -----------------------------------  Eppie Gibson, MD

## 2021-06-11 NOTE — Progress Notes (Signed)
Anti-emetic script to [facility] per request of oral oncology pharmacist due to patient on Xeloda beginning 06/14/21.

## 2021-06-14 ENCOUNTER — Encounter: Payer: Self-pay | Admitting: Oncology

## 2021-06-14 ENCOUNTER — Telehealth: Payer: Self-pay

## 2021-06-14 NOTE — Telephone Encounter (Signed)
Received Mychart message from patient request a copy of the patient's Foundation testing be faxed to [facility] at [place] [phone]). [facility] and verified their fax number.

## 2021-06-15 ENCOUNTER — Encounter: Payer: Self-pay | Admitting: *Deleted

## 2021-06-16 NOTE — Telephone Encounter (Signed)
Oral Chemotherapy Pharmacist Encounter   [facility] last week to confirm they had the capecitabine that [facility] stated they mailed them. Received a return call today stating that they had the medication and began giving him the medication Monday 06/14/21 as scheduled. (Note: This varies from the report given to the office yesterday, which was that the medication was lost, then found and started on 06/15/21)  Emailed copy of his capecitabine medication calendar to the nurse you called so they can track his 14 day on/7 day off scheduled. They were informed that the schedule may change if needed base on Mr. Gravett's response to treatment.    Darl Pikes, PharmD, BCPS, BCOP, CPP Hematology/Oncology Clinical Pharmacist [place]/DB/AP Oral [place] [phone]  06/16/2021 2:41 PM

## 2021-06-17 ENCOUNTER — Ambulatory Visit: Payer: Self-pay | Admitting: Urology

## 2021-06-18 ENCOUNTER — Encounter: Payer: Self-pay | Admitting: General Practice

## 2021-06-18 NOTE — Progress Notes (Signed)
[facility] Spiritual Care Note  Followed up with Mr Otterness by phone, providing opportunity for him to share and process health and meaning-making updates. He notes that he is worried about deconditioning, so he plans to order some workout equipment to use in his room. 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 We plan to follow up by phone next month.   [place], [place], [facility] Pager [phone] Voicemail [phone]

## 2021-06-22 NOTE — Progress Notes (Incomplete)
Mr. Cederberg presents today for follow-up after completing radiation to his T-spine on 05/13/2021  Pain on a scale of 0-10 is: ***   If Spine Met(s), symptoms, if any, include: Bowel/Bladder retention or incontinence (please describe): *** Numbness or weakness in extremities (please describe): *** Current Decadron regimen, if applicable: ***  Ambulatory status? Walker? Wheelchair?: {VQI Ambulatory Status:20974}  Current Complaints / other details:  ***Continuing care under Dr. Betsy Coder and Dr. Nori Riis Ready ([facility])  CT C/A/P w/ Contrast 05/31/2021 --IMPRESSION: The large mass of the left pleura, mediastinum, and thoracic paraspinal region is reduced in size compared to prior. Interval posterolateral rod and pedicle screw fixation at T3-T4-T6-T7 with posterior decompression at T4 through T6. New 30% compression fracture at T7. Pleural nodule along the left posterior costophrenic angle. There is some tethering of the atelectatic portion of the left lower lobe to the parietal pleural margin. Similar destructive findings of the left fifth rib and to a lesser extent the left sixth rib. There is also callus formation associated with a healing fracture the right anterior fourth rib. Substantially elevated left hemidiaphragm. Minimally improved moderate left pleural effusion. Mild enlargement of a superior segment right lower lobe pulmonary nodule, currently 1.3 by 1.0 cm. Enhancing lesion in the right hepatic lobe is not typical for metastatic disease and probably represents either a portal vein varix or hemangioma. Surveillance suggested. Prominent stool throughout the colon favors constipation. Prominent stool in rectal vault, fecal impaction not excluded. Clustered suspected atypical infectious bronchiolitis in the right lower lobe. Other imaging findings of potential clinical significance: Aortic Atherosclerosis (ICD10-I70.0). Improved caliber of the left pulmonary artery. 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  Transverse duodenal diverticulum. Small benign-appearing chondroid lesion of the right proximal femoral metaphysis.

## 2021-06-23 ENCOUNTER — Other Ambulatory Visit: Payer: Self-pay | Admitting: Adult Health

## 2021-06-23 ENCOUNTER — Encounter: Payer: Self-pay | Admitting: Adult Health

## 2021-06-23 ENCOUNTER — Inpatient Hospital Stay: Payer: Medicare PPO | Admitting: Nurse Practitioner

## 2021-06-23 ENCOUNTER — Non-Acute Institutional Stay (SKILLED_NURSING_FACILITY): Payer: Medicare PPO | Admitting: Adult Health

## 2021-06-23 ENCOUNTER — Ambulatory Visit
Admission: RE | Admit: 2021-06-23 | Discharge: 2021-06-23 | Disposition: A | Discharge status: Home / Self Care | Payer: Medicare PPO | Source: Ambulatory Visit | Attending: Radiation Oncology | Admitting: Radiation Oncology

## 2021-06-23 ENCOUNTER — Telehealth: Payer: Self-pay | Admitting: *Deleted

## 2021-06-23 ENCOUNTER — Telehealth: Payer: Self-pay | Admitting: Orthopedic Surgery

## 2021-06-23 DIAGNOSIS — J189 Pneumonia, unspecified organism: Secondary | ICD-10-CM

## 2021-06-23 MED ORDER — DOXYCYCLINE HYCLATE 100 MG PO TABS: 100.0000 mg | ORAL_TABLET | Freq: Two times a day (BID) | ORAL | 0 refills | Status: AC

## 2021-06-23 MED ORDER — SACCHAROMYCES BOULARDII 250 MG PO CAPS: 250.0000 mg | ORAL_CAPSULE | Freq: Two times a day (BID) | ORAL | 0 refills | Status: AC

## 2021-06-23 NOTE — Progress Notes (Signed)
Scheduler spoke with the transportation department at [facility] and they stated that they will bring Bradley Oliver to his appointment on 06/29/21@ 200pm. Their phone number is [phone].

## 2021-06-23 NOTE — Progress Notes (Signed)
RN spoke with Bradley Oliver and notified him that the transportation department at [facility] states that they will bring him to the appointment on the 7th of February @ 2pm.

## 2021-06-23 NOTE — Telephone Encounter (Signed)
CALLED PATIENT TO RESCHEDULE HIS FU FOR 06-23-21 PER PATIENT REQUEST, LVM FOR A RETURN CALL

## 2021-06-23 NOTE — Progress Notes (Addendum)
Patient call stating that he could not make his appointment today with Dr. Isidore Moos due to not having any transportation. Asked to reschedule his appointment. Informed patient that the scheduler would call him .

## 2021-06-23 NOTE — Telephone Encounter (Signed)
Nursing from [facility] reporting CXR results-left upper lobe pneumonia. Orders for doxycycline 100 mg po bid x 7 days, as well as, Florastor 250 mg po bid x 7 days given.

## 2021-06-23 NOTE — Progress Notes (Signed)
Location:  [place] Room Number: 809 A Place of Service:  SNF (31) Provider:  Durenda Age, DNP, FNP-BC  Patient Care Team: [place] as PCP - General (Internal Medicine)  Extended Emergency Contact Information Primary Emergency Contact: [place], [place] Phone: [phone] Relation: Brother Secondary Emergency Contact: [place] Phone: [phone] Relation: Mother  Code Status:  DNR  Goals of care: Advanced Directive information Advanced Directives 06/23/2021  Does Patient Have a Medical Advance Directive? Yes  Type of Paramedic of [place];Living will;Out of facility DNR (pink MOST or yellow form)  Does patient want to make changes to medical advance directive? No - Patient declined  Copy of [place] in Chart? Yes - validated most recent copy scanned in chart (See row information)  Pre-existing out of facility DNR order (yellow form or pink MOST form) Pink MOST form placed in chart (order not valid for inpatient use);Yellow form placed in chart (order not valid for inpatient use)     Chief Complaint  Patient presents with   Acute Visit    Shortness of breath    HPI:  Pt is a 66 y.o. male seen today for pneumonia. He is a long-term care resident of [facility]. He complained of shortness of breath last night with O2 sat of 88% while on room air. He was started on O2 at 2L/min via [place] which brought up O2 sat to 94%. No reported fever nor chills. Chest x-ray showed modest left upper lobe pneumonia.  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  He was seen in his room and was noted shortness of breath when talking.  No wheezing on bilateral lung fields.  He has dry cough.  Past Medical History:  Diagnosis Date   Blood in stool    Cancer ([place]) 08/05/14    right tonsil, P16 positive   Past Surgical History:  Procedure Laterality Date   EYE SURGERY     KNEE SURGERY     LUMBAR PERCUTANEOUS PEDICLE SCREW 4 LEVEL N/A 04/02/2021   Procedure: THORACIC THREE-THORACIC SEVEN, INSTRUMENTATION AND FUSION, THORACIC FOUR-THORACIC SIX LAMINECTOMY, THORACIC FIVE TRANSPEDICULAR DECOMPRESSION;  Surgeon: Dawley, Theodoro Doing, DO;  Location: [facility];  Service: Neurosurgery;  Laterality: N/A;   tonsil biopsy Right 08/05/14   invasive squamous cell carcinoma    No Known Allergies  Outpatient Encounter Medications as of 06/23/2021  Medication Sig   bisacodyl (DULCOLAX) 10 MG suppository Place 10 mg rectally as needed for moderate constipation.   capecitabine (XELODA) 500 MG tablet Take 3 tablets (1500mg ) by mouth in AM and 2 tablets (1000mg ) in PM. Take with food. Take for 14 days, then hold for 7 day. Repeat every 21 days. Start on 06/14/21   dexamethasone (DECADRON) 2 MG tablet Take 2mg  BID. Starting on 05/23/21, taper to 2mg  daily. Starting on 05/30/21, taper to 1mg  (half tablet) daily. Last dose on 06/12/21.   doxycycline (VIBRA-TABS) 100 MG tablet Take 1 tablet (100 mg total) by mouth 2 (two) times daily for 10 days.   Ensure Plus (ENSURE PLUS) LIQD Take 237 mLs by mouth in the morning, at noon, and at bedtime.   levothyroxine (SYNTHROID) 50 MCG tablet Take 50 mcg by mouth daily.   lidocaine (LIDODERM) 5 % Place 1 patch onto the skin daily as needed (pain). (Patient not taking: Reported on 05/10/2021)  lidocaine (XYLOCAINE) 2 % solution Use as directed 10 mLs in the mouth or throat as needed for mouth pain. (Patient not taking: Reported on 05/10/2021)   LORazepam (ATIVAN) 0.5 MG tablet Take 1 tablet (0.5 mg total) by mouth daily as needed for anxiety.   Magnesium Hydroxide (MILK OF MAGNESIA PO) Take 30 mLs by mouth as needed.   Multiple Vitamins-Minerals (MULTIVITAMIN ADULT) TABS Take 1 tablet by mouth daily. with Omega 3   polyethylene glycol (MIRALAX / GLYCOLAX) 17 g  packet Take 17 g by mouth daily as needed for mild constipation.   prochlorperazine (COMPAZINE) 10 MG tablet Take 1 tablet (10 mg total) by mouth every 6 (six) hours as needed for nausea.   saccharomyces boulardii (FLORASTOR) 250 MG capsule Take 1 capsule (250 mg total) by mouth 2 (two) times daily for 13 days.   Sodium Phosphates (RA SALINE ENEMA RE) Place 1 Dose rectally as needed.   No facility-administered encounter medications on file as of 06/23/2021.    Review of Systems  Constitutional:  Negative for chills and fever.  HENT:  Negative for sore throat.   Eyes: Negative.   Respiratory:  Positive for cough and shortness of breath. Negative for wheezing.   Cardiovascular:  Negative for chest pain and leg swelling.  Gastrointestinal:  Negative for abdominal distention, diarrhea and vomiting.  Genitourinary:  Negative for dysuria, frequency and urgency.  Skin:  Negative for color change and rash.  Neurological:  Negative for dizziness and headaches.  Psychiatric/Behavioral:  Negative for behavioral problems and sleep disturbance. The patient is not nervous/anxious.       Immunization History  Administered Date(s) Administered   Janssen (J&J) SARS-COV-2 Vaccination 08/31/2019   PPD Test 09/14/2017   Unspecified SARS-COV-2 Vaccination 09/21/2019   Pertinent  Health Maintenance Due  Topic Date Due   COLONOSCOPY (Pts 45-49yrs Insurance coverage will need to be confirmed)  Never done   INFLUENZA VACCINE  Never done   Fall Risk 04/08/2021 04/08/2021 04/09/2021 04/20/2021 05/10/2021  Patient Fall Risk Level High fall risk High fall risk High fall risk High fall risk High fall risk     Vitals:   06/23/21 1607  BP: 103/70  Pulse: 100  Resp: (!) 22  Temp: 97.7 F (36.5 C)  Weight: 118 lb 3.2 oz (53.6 kg)  Height: 5\' 10"  (1.778 m)   Body mass index is 16.96 kg/m.  Physical Exam Constitutional:      Comments: Cachectic.  HENT:     Head: Normocephalic and atraumatic.      Mouth/Throat:     Mouth: Mucous membranes are moist.  Eyes:     Conjunctiva/sclera: Conjunctivae normal.  Cardiovascular:     Rate and Rhythm: Normal rate and regular rhythm.     Pulses: Normal pulses.     Heart sounds: Normal heart sounds.  Pulmonary:     Breath sounds: Normal breath sounds. No wheezing.     Comments: Has SOB when talking, on O2 @ 2L/min via [place] Abdominal:     General: Bowel sounds are normal.     Palpations: Abdomen is soft.  Musculoskeletal:        General: No swelling. Normal range of motion.     Cervical back: Normal range of motion.  Skin:    General: Skin is warm and dry.  Neurological:     Mental Status: He is alert and oriented to person, place, and time.  Psychiatric:        Mood and Affect: Mood normal.  Behavior: Behavior normal.        Thought Content: Thought content normal.        Judgment: Judgment normal.       Labs reviewed: Recent Labs    04/01/21 1912 04/02/21 2341 04/04/21 0340 04/14/21 0000 04/21/21 0000 04/23/21 0000 05/31/21 1155  NA 137   < > 133*   < > 135* 135* 136  K 3.4*   < > 3.8   < > 3.7 4.2 3.8  CL 101  --  101   < > 100 96* 98  CO2 27  --  25   < > 22 28* 29  GLUCOSE 97  --  148*  --   --   --  86  BUN 20  --  17   < > 32* 28* 31*  CREATININE 0.72   < > 0.69   < > 0.5* 0.6 0.68  CALCIUM 9.3  --  8.5*   < > 8.6* 9.0 8.9   < > = values in this interval not displayed.   Recent Labs    04/01/21 1912 04/04/21 0340 05/31/21 1155  AST 27 31 22   ALT 11 11 23   ALKPHOS 82 58 45  BILITOT 1.2 0.8 0.7  PROT 7.2 5.2* 5.9*  ALBUMIN 4.2 3.0* 3.6   Recent Labs    04/01/21 1912 04/02/21 2341 04/03/21 0225 04/04/21 0340 04/14/21 0000 04/21/21 0000 04/23/21 0000 05/31/21 1155  WBC 6.6  --  10.6* 11.7*   < > 11.2 9.8 7.3  NEUTROABS 5.4  --   --   --   --   --   --  5.9  HGB 14.0   < > 12.8* 12.2*   < > 12.4* 13.3* 14.1  HCT 40.9   < > 37.1* 35.8*   < > 37* 39* 42.8  MCV 88.1  --  86.7 85.6  --   --   --   91.8  PLT 274  --  205 224   < > 221 214 191   < > = values in this interval not displayed.   Lab Results  Component Value Date   TSH 3.287 05/31/2021   No results found for: HGBA1C Lab Results  Component Value Date   CHOL 164 09/11/2020   HDL 48 09/11/2020   LDLCALC 95 09/11/2020   TRIG 118 09/11/2020   CHOLHDL 3.4 09/11/2020    Significant Diagnostic Results in last 30 days:  CT SOFT TISSUE NECK W CONTRAST  Result Date: 06/01/2021 CLINICAL DATA:  Restaging head neck cancer EXAM: CT NECK WITH CONTRAST TECHNIQUE: Multidetector CT imaging of the neck was performed using the standard protocol following the bolus administration of intravenous contrast. CONTRAST:  48mL OMNIPAQUE IOHEXOL 300 MG/ML  SOLN COMPARISON:  08/01/2014 FINDINGS: Pharynx and larynx: Post treatment mucosal spaces with no evidence of tumor or swelling. Salivary glands: Resected right submandibular gland. No active disease Thyroid: Symmetric small gland Lymph nodes: None enlarged or abnormal density. Vascular: Post treatment occluded segments of the bilateral IJ, superiorly on the right and inferiorly on the left. Limited intracranial: Negative Visualized orbits: Negative Mastoids and visualized paranasal sinuses: Clear Skeleton: No acute or destructive findings in the head and cervical spine. Post treatment thoracic spine described on dedicated chest CT. Upper chest: Described on dedicated CT. IMPRESSION: 1. No evidence of residual or recurrent disease in the neck. 2. Chest reported separately. Electronically Signed   By: Jorje Guild M.D.   On: 06/01/2021 03:59  CT CHEST ABDOMEN PELVIS W CONTRAST  Result Date: 06/01/2021 CLINICAL DATA:  Tonsillar cancer with left thoracic spinal and paraspinal metastatic disease causing prior cord compression and with left lung involvement. EXAM: CT CHEST, ABDOMEN, AND PELVIS WITH CONTRAST TECHNIQUE: Multidetector CT imaging of the chest, abdomen and pelvis was performed following the  standard protocol during bolus administration of intravenous contrast. CONTRAST:  60mL OMNIPAQUE IOHEXOL 300 MG/ML  SOLN COMPARISON:  Multiple exams, including 04/02/2021 FINDINGS: CT CHEST FINDINGS Cardiovascular: Mild atherosclerotic calcification of the aortic arch. Small although improved caliber of the left pulmonary artery possibly from surrounding tumor and/or vasoconstriction related to hypoaeration. Mediastinum/Nodes: Hypodense tumor invasion of the left side of the mediastinum along a substantial swath of the pleural margin, with tumor surrounding portions of the left subclavian and left common carotid artery and surrounding about 180 degrees of the arc of the aortic arch, with anterior mediastinal extension continuing as on image 18 series 3, and with tumor surrounding the left pulmonary artery and left mainstem bronchus. However, the thickness of the rind of pleural and mediastinal tumor is substantially reduced compared to 04/02/2021, for example about 0.8 cm adjacent to the aortic arch compared to previous 2.1 cm. Separate from the tumor I do not see well-defined adenopathy in the mediastinum. Lungs/Pleura: Scarring in the right upper lobe noted. Clustered ground-glass and tree-in-bud nodularity anteriorly in the right lower lobe on image 82 of series 5 is new and probably from atypical infectious bronchiolitis. Similar findings with associated airway plugging noted anteriorly in the right lower lobe on image 105 of series 5. There is some subsegmental atelectasis in the right lower lobe along with a superior segment right lower lobe subpleural irregular nodule measuring 1.3 by 1.0 cm on image 52 of series 5, previously 0.9 by 0.8 cm. Volume loss medially in the left upper lobe with associated airway plugging and airway thickening, with some sparing of the lingula. Moderate left pleural effusion. Prior ground-glass opacities in the left upper lobe have improved. The pleural effusion is moderate but  slightly improved from prior. Atelectasis in the left lower lobe is mildly improved. Tethering of part of the atelectatic left lower lobe to the parietal pleural surface is suspected on images 27 through 30 of series 3. 2.0 by 0.5 cm enhancing nodule along the parietal pleural surface in the left costophrenic angle on image 41 of series 3, suspicious for tumor and increased in conspicuity from prior. Musculoskeletal: Posterolateral rod and pedicle screw fixation at T3-T4-T6-T7 bilaterally with stable pathologic burst compression at T5, which has about 5 mm of posterior bony retropulsion. Posterior decompression at T4 through T6. There is some sclerosis in the T5 vertebral body. There is also some interval mild compression at T7 with 30% loss of vertebral body height on image 60 of series 7. Suspected right eccentric hemangioma at T12. Chronic bony destruction of the left fifth rib medially, with irregularity and some erosion of the left sixth rib medially as well related to prior tumor, mostly similar to the prior exam. The left paraspinal tumor spanning from the T4 level down through T8 is somewhat reduced compared to prior, measuring about 3.8 cm transverse on image 22 of series 3 compared to prior 4.8 cm. The left T5 and T6 pedicle screws capture the lateral cortex of the associated vertebral bodies. It is difficult to characterize the spinal canal in the region of involvement due to streak artifact from the hardware. Callus formation associated with healing fracture of the right anterior  fourth rib. Substantially elevated left hemidiaphragm. CT ABDOMEN PELVIS FINDINGS Hepatobiliary: Contracted gallbladder. Mildly blurred by motion artifact. 1.6 by 1.4 cm enhancing lesion in the right hepatic lobe on image 64 of series 3 has density similar to the adjacent portal vein on portal venous and delayed phase images. This would be an atypical appearance for a metastatic lesion but could represent a portal vein varix or  hemangioma. Questionable small bandlike lucency along the upper margin of this lesion. Pancreas: Unremarkable Spleen: Unremarkable Adrenals/Urinary Tract: Unremarkable Stomach/Bowel: Large but not inflamed proximal transverse duodenal diverticulum. Prominent stool throughout the colon favors constipation. Prominence of stool in the rectal vault measuring 7.7 by 7.4 cm, no definite perirectal inflammatory findings to definitively suggest stercoral colitis, but fecal impaction is not excluded. Vascular/Lymphatic: Atherosclerosis is present, including aortoiliac atherosclerotic disease. No pathologic adenopathy identified. Reproductive: Unremarkable Other: No supplemental non-categorized findings. Musculoskeletal: Small likely benign chondroid lesion of the proximal right femoral metaphysis, image 129 series 3. The small enhancing lesion inferiorly in the right L5 vertebral body shown on prior MRI is not well seen on today's CT. IMPRESSION: 1. The large mass of the left pleura, mediastinum, and thoracic paraspinal region is reduced in size compared to prior. Interval posterolateral rod and pedicle screw fixation at T3-T4-T6-T7 with posterior decompression at T4 through T6. 2. New 30% compression fracture at T7. 3. Pleural nodule along the left posterior costophrenic angle. There is some tethering of the atelectatic portion of the left lower lobe to the parietal pleural margin. 4. Similar destructive findings of the left fifth rib and to a lesser extent the left sixth rib. There is also callus formation associated with a healing fracture the right anterior fourth rib. 5. Substantially elevated left hemidiaphragm. 6. Minimally improved moderate left pleural effusion. 7. Mild enlargement of a superior segment right lower lobe pulmonary nodule, currently 1.3 by 1.0 cm. 8. Enhancing lesion in the right hepatic lobe is not typical for metastatic disease and probably represents either a portal vein varix or hemangioma.  Surveillance suggested. 9. Prominent stool throughout the colon favors constipation. Prominent stool in rectal vault, fecal impaction not excluded. 10. Clustered suspected atypical infectious bronchiolitis in the right lower lobe. 11. Other imaging findings of potential clinical significance: Aortic Atherosclerosis (ICD10-I70.0). Improved caliber of the left pulmonary artery. 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 Small benign-appearing chondroid lesion of the right proximal femoral metaphysis. Electronically Signed   By: Van Clines M.D.   On: 06/01/2021 07:47    Assessment/Plan  HCAP (healthcare-associated pneumonia)  -  will start on doxycycline 100 mg PO twice daily x10 days and Florastor 250 mg 1 capsule PO twice a day x13 days -Dexamethasone 2 mg 1 tab p.o. daily x5 days   Family/ staff Communication: Discussed plan of care with resident and charge nurse  Labs/tests ordered:   Chest x-ray pa and lateral    Durenda Age, DNP, MSN, FNP-BC [facility] [phone] (Monday-Friday 8:00 a.m. - 5:00 p.m.) [phone] (after hours)

## 2021-06-25 ENCOUNTER — Inpatient Hospital Stay: Payer: Medicare PPO | Admitting: Nurse Practitioner

## 2021-06-27 ENCOUNTER — Other Ambulatory Visit: Payer: Self-pay | Admitting: Adult Health

## 2021-06-27 MED ORDER — LORAZEPAM 0.5 MG PO TABS: 0.5000 mg | ORAL_TABLET | Freq: Every day | ORAL | 0 refills | Status: DC | PRN

## 2021-06-29 ENCOUNTER — Inpatient Hospital Stay: Payer: Medicare PPO

## 2021-06-29 ENCOUNTER — Ambulatory Visit
Admission: RE | Admit: 2021-06-29 | Discharge: 2021-06-29 | Disposition: A | Discharge status: Home / Self Care | Payer: Medicare PPO | Source: Ambulatory Visit | Attending: Radiation Oncology | Admitting: Radiation Oncology

## 2021-06-29 ENCOUNTER — Encounter: Payer: Self-pay | Admitting: Radiation Oncology

## 2021-06-29 ENCOUNTER — Other Ambulatory Visit: Payer: Self-pay

## 2021-06-29 ENCOUNTER — Encounter: Payer: Self-pay | Admitting: Nurse Practitioner

## 2021-06-29 ENCOUNTER — Inpatient Hospital Stay: Payer: Medicare PPO | Attending: Internal Medicine | Admitting: Nurse Practitioner

## 2021-06-29 VITALS — BP 89/54

## 2021-06-29 VITALS — BP 79/62 | HR 94 | Temp 96.7°F | Resp 18 | Ht 70.0 in

## 2021-06-29 DIAGNOSIS — E860 Dehydration: Secondary | ICD-10-CM

## 2021-06-29 DIAGNOSIS — Z515 Encounter for palliative care: Secondary | ICD-10-CM | POA: Diagnosis not present

## 2021-06-29 DIAGNOSIS — C090 Malignant neoplasm of tonsillar fossa: Secondary | ICD-10-CM | POA: Insufficient documentation

## 2021-06-29 DIAGNOSIS — Z923 Personal history of irradiation: Secondary | ICD-10-CM | POA: Diagnosis not present

## 2021-06-29 DIAGNOSIS — J9811 Atelectasis: Secondary | ICD-10-CM | POA: Insufficient documentation

## 2021-06-29 DIAGNOSIS — J90 Pleural effusion, not elsewhere classified: Secondary | ICD-10-CM | POA: Diagnosis not present

## 2021-06-29 DIAGNOSIS — I959 Hypotension, unspecified: Secondary | ICD-10-CM | POA: Diagnosis not present

## 2021-06-29 DIAGNOSIS — Z79899 Other long term (current) drug therapy: Secondary | ICD-10-CM | POA: Diagnosis not present

## 2021-06-29 DIAGNOSIS — C7800 Secondary malignant neoplasm of unspecified lung: Secondary | ICD-10-CM | POA: Insufficient documentation

## 2021-06-29 DIAGNOSIS — R531 Weakness: Secondary | ICD-10-CM

## 2021-06-29 DIAGNOSIS — Z87891 Personal history of nicotine dependence: Secondary | ICD-10-CM | POA: Insufficient documentation

## 2021-06-29 DIAGNOSIS — C099 Malignant neoplasm of tonsil, unspecified: Secondary | ICD-10-CM

## 2021-06-29 DIAGNOSIS — C7951 Secondary malignant neoplasm of bone: Secondary | ICD-10-CM | POA: Insufficient documentation

## 2021-06-29 DIAGNOSIS — I82C13 Acute embolism and thrombosis of internal jugular vein, bilateral: Secondary | ICD-10-CM | POA: Insufficient documentation

## 2021-06-29 DIAGNOSIS — R911 Solitary pulmonary nodule: Secondary | ICD-10-CM | POA: Insufficient documentation

## 2021-06-29 DIAGNOSIS — R630 Anorexia: Secondary | ICD-10-CM

## 2021-06-29 DIAGNOSIS — R5381 Other malaise: Secondary | ICD-10-CM | POA: Insufficient documentation

## 2021-06-29 DIAGNOSIS — R634 Abnormal weight loss: Secondary | ICD-10-CM

## 2021-06-29 MED ORDER — SODIUM CHLORIDE 0.9 % IV SOLN: INTRAVENOUS | Status: DC

## 2021-06-29 NOTE — Progress Notes (Signed)
Bradley Oliver presents today for follow-up after completing radiation to his T-spine on 05/13/2021   Pain on a scale of 0-10 is: Reports back pain has improved. He is able to tolerate sitting upright for longer period of time. States the only area currently with noticeable discomfort is his right shoulder blade   Ambulatory status? Walker? Wheelchair?: Presents to clinic in wheelchair. Strength/endurance are still significantly diminished, but he is working with PT at his SNF to build up endurance   Current Complaints / other details:  Had CT of neck + CAP w/ contrast on 05/31/2021. Continuing care under Dr. Betsy Coder and Dr. Nori Riis Ready ([facility]). Reports he's recovering from pneumonia

## 2021-06-29 NOTE — Progress Notes (Signed)
Radiation Oncology         [phone]) [phone] ________________________________  Name: [name]. Olivo MRN: [id number]  Date: 06/29/2021  DOB: 07/27/1955  Follow-Up Visit Note  Outpatient  CC: [facility]  Ladell Pier, MD  Diagnosis and Prior Radiotherapy:    ICD-10-CM   1. Bone metastases ([place])  C79.51        Radiation Treatment Dates: 04/29/2021 through 05/13/2021 Site Technique Total Dose (Gy) Dose per Fx (Gy) Completed Fx Beam Energies  Thoracic Spine: Spine_T_Chest 3D 30/30 3 10/10 10X, 15X   CHIEF COMPLAINT: Here for follow-up and surveillance of bone cancer  Narrative:  The patient returns today for routine follow-up.  Mr. Shatto presents today for follow-up after completing radiation to his T-spine on 05/13/2021   Pain on a scale of 0-10 is: Reports back pain has improved. He is able to tolerate sitting upright for longer period of time. States the only area currently with noticeable discomfort is his right shoulder blade   Ambulatory status? Walker? Wheelchair?: Presents to clinic in wheelchair. Strength/endurance are still significantly diminished, but he is working with PT at his SNF to build up endurance   Current Complaints / other details:  Had CT of neck + CAP w/ contrast on 05/31/2021. Continuing care under Dr. Betsy Coder and Dr. Nori Riis Ready ([facility]). Reports he's recovering from pneumonia   He anticipates leaving his SNF soon. Has multiple complaints re:SNF.  Feels dehydrated and reports he receives water at his SNF but is not drinking much (his reasoning is difficult to understand). He  is pleased with the overall resolution of his back pain.                   ALLERGIES:  has No Known Allergies.  Meds: Current Outpatient Medications  Medication Sig Dispense Refill   bisacodyl (DULCOLAX) 10 MG suppository Place 10 mg rectally as needed for moderate constipation.     capecitabine (XELODA) 500 MG tablet Take 3 tablets (1500mg ) by mouth in AM and 2 tablets  (1000mg ) in PM. Take with food. Take for 14 days, then hold for 7 day. Repeat every 21 days. Start on 06/14/21 70 tablet 0   doxycycline (VIBRA-TABS) 100 MG tablet Take 1 tablet (100 mg total) by mouth 2 (two) times daily for 10 days. 20 tablet 0   Ensure Plus (ENSURE PLUS) LIQD Take 237 mLs by mouth in the morning, at noon, and at bedtime.     levothyroxine (SYNTHROID) 50 MCG tablet Take 50 mcg by mouth daily.     lidocaine (LIDODERM) 5 % Place 1 patch onto the skin daily as needed (pain).     lidocaine (XYLOCAINE) 2 % solution Use as directed 10 mLs in the mouth or throat as needed for mouth pain.     LORazepam (ATIVAN) 0.5 MG tablet Take 1 tablet (0.5 mg total) by mouth daily as needed for anxiety. 30 tablet 0   Magnesium Hydroxide (MILK OF MAGNESIA PO) Take 30 mLs by mouth as needed.     Multiple Vitamins-Minerals (MULTIVITAMIN ADULT) TABS Take 1 tablet by mouth daily. with Omega 3     NON FORMULARY 4 (four) times daily. Magic cup  1 cup     polyethylene glycol (MIRALAX / GLYCOLAX) 17 g packet Take 17 g by mouth daily as needed for mild constipation. 14 each 0   saccharomyces boulardii (FLORASTOR) 250 MG capsule Take 1 capsule (250 mg total) by mouth 2 (two) times daily for 13 days.  26 capsule 0   Sodium Phosphates (RA SALINE ENEMA RE) Place 1 Dose rectally as needed.     No current facility-administered medications for this encounter.   Facility-Administered Medications Ordered in Other Encounters  Medication Dose Route Frequency Provider Last Rate Last Admin   0.9 %  sodium chloride infusion   Intravenous Continuous Pickenpack-Cousar, Carlena Sax, NP 999 mL/hr at 06/29/21 1525 New Bag at 06/29/21 1525    Physical Findings: The patient is in no acute distress. Patient is alert and oriented.  height is 5\' 10"  (1.778 m). His temporal temperature is 96.7 F (35.9 C) (abnormal). His blood pressure is 79/62 (abnormal) and his pulse is 94. His respiration is 18 and oxygen saturation is 97%. .     General: Alert in a WC, in no acute distress.  HEENT: hoarse Skin: Well healed thoracic spine surgical scar.  Patient's skin is erythematous in this region, related to chronic placement of a large bandage that was dirty.  Bandage removed and a couple small bandaids were placed over punctate superficial areas of bleeding adjacent to scar  Musculoskeletal: able to lift extremities against gravity but diffusely weak Ext: no edema  Lab Findings: Lab Results  Component Value Date   WBC 7.3 05/31/2021   HGB 14.1 05/31/2021   HCT 42.8 05/31/2021   MCV 91.8 05/31/2021   PLT 191 05/31/2021    Radiographic Findings: CT SOFT TISSUE NECK W CONTRAST  Result Date: 06/01/2021 CLINICAL DATA:  Restaging head neck cancer EXAM: CT NECK WITH CONTRAST TECHNIQUE: Multidetector CT imaging of the neck was performed using the standard protocol following the bolus administration of intravenous contrast. CONTRAST:  22mL OMNIPAQUE IOHEXOL 300 MG/ML  SOLN COMPARISON:  08/01/2014 FINDINGS: Pharynx and larynx: Post treatment mucosal spaces with no evidence of tumor or swelling. Salivary glands: Resected right submandibular gland. No active disease Thyroid: Symmetric small gland Lymph nodes: None enlarged or abnormal density. Vascular: Post treatment occluded segments of the bilateral IJ, superiorly on the right and inferiorly on the left. Limited intracranial: Negative Visualized orbits: Negative Mastoids and visualized paranasal sinuses: Clear Skeleton: No acute or destructive findings in the head and cervical spine. Post treatment thoracic spine described on dedicated chest CT. Upper chest: Described on dedicated CT. IMPRESSION: 1. No evidence of residual or recurrent disease in the neck. 2. Chest reported separately. Electronically Signed   By: Jorje Guild M.D.   On: 06/01/2021 03:59   CT CHEST ABDOMEN PELVIS W CONTRAST  Result Date: 06/01/2021 CLINICAL DATA:  Tonsillar cancer with left thoracic spinal and  paraspinal metastatic disease causing prior cord compression and with left lung involvement. EXAM: CT CHEST, ABDOMEN, AND PELVIS WITH CONTRAST TECHNIQUE: Multidetector CT imaging of the chest, abdomen and pelvis was performed following the standard protocol during bolus administration of intravenous contrast. CONTRAST:  45mL OMNIPAQUE IOHEXOL 300 MG/ML  SOLN COMPARISON:  Multiple exams, including 04/02/2021 FINDINGS: CT CHEST FINDINGS Cardiovascular: Mild atherosclerotic calcification of the aortic arch. Small although improved caliber of the left pulmonary artery possibly from surrounding tumor and/or vasoconstriction related to hypoaeration. Mediastinum/Nodes: Hypodense tumor invasion of the left side of the mediastinum along a substantial swath of the pleural margin, with tumor surrounding portions of the left subclavian and left common carotid artery and surrounding about 180 degrees of the arc of the aortic arch, with anterior mediastinal extension continuing as on image 18 series 3, and with tumor surrounding the left pulmonary artery and left mainstem bronchus. However, the thickness of the rind of  pleural and mediastinal tumor is substantially reduced compared to 04/02/2021, for example about 0.8 cm adjacent to the aortic arch compared to previous 2.1 cm. Separate from the tumor I do not see well-defined adenopathy in the mediastinum. Lungs/Pleura: Scarring in the right upper lobe noted. Clustered ground-glass and tree-in-bud nodularity anteriorly in the right lower lobe on image 82 of series 5 is new and probably from atypical infectious bronchiolitis. Similar findings with associated airway plugging noted anteriorly in the right lower lobe on image 105 of series 5. There is some subsegmental atelectasis in the right lower lobe along with a superior segment right lower lobe subpleural irregular nodule measuring 1.3 by 1.0 cm on image 52 of series 5, previously 0.9 by 0.8 cm. Volume loss medially in the left  upper lobe with associated airway plugging and airway thickening, with some sparing of the lingula. Moderate left pleural effusion. Prior ground-glass opacities in the left upper lobe have improved. The pleural effusion is moderate but slightly improved from prior. Atelectasis in the left lower lobe is mildly improved. Tethering of part of the atelectatic left lower lobe to the parietal pleural surface is suspected on images 27 through 30 of series 3. 2.0 by 0.5 cm enhancing nodule along the parietal pleural surface in the left costophrenic angle on image 41 of series 3, suspicious for tumor and increased in conspicuity from prior. Musculoskeletal: Posterolateral rod and pedicle screw fixation at T3-T4-T6-T7 bilaterally with stable pathologic burst compression at T5, which has about 5 mm of posterior bony retropulsion. Posterior decompression at T4 through T6. There is some sclerosis in the T5 vertebral body. There is also some interval mild compression at T7 with 30% loss of vertebral body height on image 60 of series 7. Suspected right eccentric hemangioma at T12. Chronic bony destruction of the left fifth rib medially, with irregularity and some erosion of the left sixth rib medially as well related to prior tumor, mostly similar to the prior exam. The left paraspinal tumor spanning from the T4 level down through T8 is somewhat reduced compared to prior, measuring about 3.8 cm transverse on image 22 of series 3 compared to prior 4.8 cm. The left T5 and T6 pedicle screws capture the lateral cortex of the associated vertebral bodies. It is difficult to characterize the spinal canal in the region of involvement due to streak artifact from the hardware. Callus formation associated with healing fracture of the right anterior fourth rib. Substantially elevated left hemidiaphragm. CT ABDOMEN PELVIS FINDINGS Hepatobiliary: Contracted gallbladder. Mildly blurred by motion artifact. 1.6 by 1.4 cm enhancing lesion in the  right hepatic lobe on image 64 of series 3 has density similar to the adjacent portal vein on portal venous and delayed phase images. This would be an atypical appearance for a metastatic lesion but could represent a portal vein varix or hemangioma. Questionable small bandlike lucency along the upper margin of this lesion. Pancreas: Unremarkable Spleen: Unremarkable Adrenals/Urinary Tract: Unremarkable Stomach/Bowel: Large but not inflamed proximal transverse duodenal diverticulum. Prominent stool throughout the colon favors constipation. Prominence of stool in the rectal vault measuring 7.7 by 7.4 cm, no definite perirectal inflammatory findings to definitively suggest stercoral colitis, but fecal impaction is not excluded. Vascular/Lymphatic: Atherosclerosis is present, including aortoiliac atherosclerotic disease. No pathologic adenopathy identified. Reproductive: Unremarkable Other: No supplemental non-categorized findings. Musculoskeletal: Small likely benign chondroid lesion of the proximal right femoral metaphysis, image 129 series 3. The small enhancing lesion inferiorly in the right L5 vertebral body shown on prior MRI  is not well seen on today's CT. IMPRESSION: 1. The large mass of the left pleura, mediastinum, and thoracic paraspinal region is reduced in size compared to prior. Interval posterolateral rod and pedicle screw fixation at T3-T4-T6-T7 with posterior decompression at T4 through T6. 2. New 30% compression fracture at T7. 3. Pleural nodule along the left posterior costophrenic angle. There is some tethering of the atelectatic portion of the left lower lobe to the parietal pleural margin. 4. Similar destructive findings of the left fifth rib and to a lesser extent the left sixth rib. There is also callus formation associated with a healing fracture the right anterior fourth rib. 5. Substantially elevated left hemidiaphragm. 6. Minimally improved moderate left pleural effusion. 7. Mild enlargement  of a superior segment right lower lobe pulmonary nodule, currently 1.3 by 1.0 cm. 8. Enhancing lesion in the right hepatic lobe is not typical for metastatic disease and probably represents either a portal vein varix or hemangioma. Surveillance suggested. 9. Prominent stool throughout the colon favors constipation. Prominent stool in rectal vault, fecal impaction not excluded. 10. Clustered suspected atypical infectious bronchiolitis in the right lower lobe. 11. Other imaging findings of potential clinical significance: Aortic Atherosclerosis (ICD10-I70.0). Improved caliber of the left pulmonary artery. 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 Small benign-appearing chondroid lesion of the right proximal femoral metaphysis. Electronically Signed   By: Van Clines M.D.   On: 06/01/2021 07:47    Impression/Plan:  Healed well from RT. Advised to let skin of back "breathe"  Low BP noted, and patient reports poor fluid intake. Recommend going to ED now. He refuses this against medical advise. Urged to push fluids.  Water provided in clinic today.  Palliative care team present during visit. They will meet further with him today. I will see him PRN. Continue medical oncology care.  On date of service, in total, I spent 25 minutes on this encounter. Patient was seen in person.  _____________________________________   Eppie Gibson, MD

## 2021-06-29 NOTE — Patient Instructions (Signed)
You received 531ml of IV fluids today for dehydration. Please increase fluid intake. Also moisturize skin three times per day for dry flakiness.

## 2021-06-29 NOTE — Progress Notes (Signed)
[place]  Telephone:([phone] [phone]   Name: Bradley Oliver. [place] Date: 06/29/2021 MRN: [id number]  DOB: 11-01-1955  Patient Care Team: [place] as PCP - General (Internal Medicine)    REASON FOR CONSULTATION: [name]. Bradley Oliver is a 66 y.o. 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  Recent hospitalization and currently at [facility] facility for rehab. Palliative ask to see for symptom management and goals of care.    SOCIAL HISTORY:     reports that he has quit smoking. He has never used smokeless tobacco. He reports that he does not currently use alcohol. He reports that he does not use drugs.  ADVANCE DIRECTIVES:  Patient does have a completed advanced directive. His brother Sargon Scouten is his HCPOA. MOST form completed today. Patient given DNR out-of-facility form.   CODE STATUS: DNR  PAST MEDICAL HISTORY: Past Medical History:  Diagnosis Date   Blood in stool    Cancer ([place]) 08/05/14   right tonsil, P16 positive    PAST SURGICAL HISTORY:  Past Surgical History:  Procedure Laterality Date   EYE SURGERY     KNEE SURGERY     LUMBAR PERCUTANEOUS PEDICLE SCREW 4 LEVEL N/A 04/02/2021   Procedure: THORACIC THREE-THORACIC SEVEN, INSTRUMENTATION AND FUSION, THORACIC FOUR-THORACIC SIX LAMINECTOMY, THORACIC FIVE TRANSPEDICULAR DECOMPRESSION;  Surgeon: Dawley, Theodoro Doing, DO;  Location: [facility];  Service: Neurosurgery;  Laterality: N/A;   tonsil biopsy Right 08/05/14   invasive squamous cell carcinoma    HEMATOLOGY/ONCOLOGY HISTORY:  Oncology History   No history exists.    ALLERGIES:  has No Known Allergies.  MEDICATIONS:  Current Outpatient Medications  Medication Sig Dispense Refill   bisacodyl (DULCOLAX) 10 MG suppository Place 10 mg  rectally as needed for moderate constipation.     capecitabine (XELODA) 500 MG tablet Take 3 tablets (1500mg ) by mouth in AM and 2 tablets (1000mg ) in PM. Take with food. Take for 14 days, then hold for 7 day. Repeat every 21 days. Start on 06/14/21 70 tablet 0   doxycycline (VIBRA-TABS) 100 MG tablet Take 1 tablet (100 mg total) by mouth 2 (two) times daily for 10 days. 20 tablet 0   Ensure Plus (ENSURE PLUS) LIQD Take 237 mLs by mouth in the morning, at noon, and at bedtime.     levothyroxine (SYNTHROID) 50 MCG tablet Take 50 mcg by mouth daily.     lidocaine (LIDODERM) 5 % Place 1 patch onto the skin daily as needed (pain).     lidocaine (XYLOCAINE) 2 % solution Use as directed 10 mLs in the mouth or throat as needed for mouth pain.     LORazepam (ATIVAN) 0.5 MG tablet Take 1 tablet (0.5 mg total) by mouth daily as needed for anxiety. 30 tablet 0   Magnesium Hydroxide (MILK OF MAGNESIA PO) Take 30 mLs by mouth as needed.     Multiple Vitamins-Minerals (MULTIVITAMIN ADULT) TABS Take 1 tablet by mouth daily. with Omega 3     NON FORMULARY 4 (four) times daily. Magic cup  1 cup     polyethylene glycol (MIRALAX / GLYCOLAX) 17 g packet Take 17 g by mouth daily as needed for mild constipation. 14 each 0   saccharomyces boulardii (FLORASTOR) 250 MG capsule Take 1 capsule (250 mg total) by mouth 2 (two) times daily for 13 days. [place]  capsule 0   Sodium Phosphates (RA SALINE ENEMA RE) Place 1 Dose rectally as needed.     No current facility-administered medications for this visit.   Facility-Administered Medications Ordered in Other Visits  Medication Dose Route Frequency Provider Last Rate Last Admin   0.9 %  sodium chloride infusion   Intravenous Continuous Pickenpack-Cousar, Linsey Arteaga N, NP       PERFORMANCE STATUS (ECOG) : 2 - Symptomatic, <50% confined to bed   Physical Exam General: NAD, thin appearing  Cardiovascular: regular rate and rhythm, hypotensive  Pulmonary: clear ant fields Abdomen:  soft, nontender, + bowel sounds Extremities: no edema, no joint deformities Skin: no rashes, dry and flaky  Neurological: Weakness, in a wheelchair, talkative, AAO x4  IMPRESSION:  Bradley Oliver is here today for follow-up. No acute distress noted. Appears thin. Skin dry and flaky. He is still residing at [facility] facility with expressed plans to discharge back home on next week. His sister is supposed to be having a meeting to arrange a schedule and discharge date for next week, his brother is arranging him a room downstairs in the home for convenience. He is anxiously awaiting to return home as he has been a the facility since November.   Dr. Lanell Persons also at the chairside. Upper back incision site assessed and dressing removed. Site is well approximated with minimal bleeding at sites that appeared irritated from the dressing. Bandaids applied. Patient is hypotensive. Denies pain, reports minor aches at time but this is controlled with Tylenol. Appetite remains minimal not because he doesn't have an appetite but due to menu is not what he is used to eating. He expresses his appetite and intake will not be of concern once he is home and he and his family can control his meals.   Coughing noted when drinking cold water. Also reports difficulty with foods unless soft and chopped.   I approached goals of care discussions. [name] is clear in his understanding of his condition. He speaks to his perception of being a "medical anomaly" He wishes to continue to treat the treatable allowing him every opportunity to thrive.   He has become very deconditioned.  During our previous visit he was ambulatory and with a walker and standby assist however reports he is no longer undergoing extensive physical therapy.  Reports he is unable to ambulate with a walker and barely able to stand up on his own.  He is able to dress his upper self and feed himself.  Since his family is available to assist once he returns home  however would like to further engage in outpatient physical therapy.  His insurance is planning to arrange this however we will plan to closely follow and ensure this has been arranged once he is discharged from facility.  He plans to notify our office of his discharge date.  We also discussed home equipment for his safety and ability to function.  He verbalized understanding and appreciation.  Mr. Rivero appears somewhat frail and hypotensive.  He again expresses he has not been drinking as much as he should.  Water provided and he was able to drink 2 cups.  Repeat blood pressure 86/54.  Recommended IV fluids while in office to support blood pressure and possible dehydration.  He verbalized understanding.  Tolerated infusion well.  Blood pressure increase to 106/64.  I discussed the importance of continued conversation with family and their medical providers regarding overall plan of care and treatment options, ensuring decisions are within  the context of the patients values and GOCs.  PLAN: Patient expressed plans to discharge from facility on next week with a goal of returning home with family support.  He will notify our office of discharge date allowing [place] to assist with any home health orders and equipment needed. Education provided on adequate nutrition and hydration to prevent further complications and ongoing dehydration.  He verbalized understanding. Patient received 500 mL bolus today.  Tolerated well. Encouraged ongoing goals of care discussions. Palliative will continue to offer support. I will plan to see him back in 2-3 weeks for follow-up. Patient also to be supported by [facility]'s outpatient Palliative.    Patient expressed understanding and was in agreement with this plan. He also understands that He can call the clinic at any time with any questions, concerns, or complaints.   Time Total: 65 min.   Visit consisted of counseling and education dealing with the complex and  emotionally intense issues of symptom management and palliative care in the setting of serious and potentially life-threatening illness.Greater than 50%  of this time was spent counseling and coordinating care related to the above assessment and plan.  Alda Lea, AGPCNP-BC  Palliative Medicine Team [phone]

## 2021-06-30 ENCOUNTER — Telehealth: Payer: Self-pay | Admitting: Nurse Practitioner

## 2021-06-30 NOTE — Telephone Encounter (Signed)
Scheduled per 2/7 los, attempted to call pt but was unable to contact, will mail calender

## 2021-07-01 ENCOUNTER — Telehealth: Payer: Self-pay | Admitting: *Deleted

## 2021-07-01 NOTE — Telephone Encounter (Signed)
Called patient to f/u on his diarrhea/mouth sores. He denies both. Had IVF on 2/7 and feels better. Tries to sip fluids often. Has been ambulating with therapy in SNF. Anxious to get home. Expressing frustration with inability to speak w/his CSW there. He is aware of appointment here tomorrow.

## 2021-07-02 ENCOUNTER — Other Ambulatory Visit: Payer: Self-pay | Admitting: *Deleted

## 2021-07-02 ENCOUNTER — Other Ambulatory Visit: Payer: Self-pay

## 2021-07-02 ENCOUNTER — Other Ambulatory Visit (HOSPITAL_COMMUNITY): Payer: Self-pay

## 2021-07-02 ENCOUNTER — Inpatient Hospital Stay (HOSPITAL_BASED_OUTPATIENT_CLINIC_OR_DEPARTMENT_OTHER): Payer: Medicare PPO | Admitting: Oncology

## 2021-07-02 ENCOUNTER — Inpatient Hospital Stay: Payer: Medicare PPO

## 2021-07-02 VITALS — BP 100/59 | HR 99 | Temp 98.7°F | Resp 18 | Ht 70.0 in | Wt 120.0 lb

## 2021-07-02 DIAGNOSIS — C099 Malignant neoplasm of tonsil, unspecified: Secondary | ICD-10-CM

## 2021-07-02 DIAGNOSIS — C090 Malignant neoplasm of tonsillar fossa: Secondary | ICD-10-CM | POA: Diagnosis not present

## 2021-07-02 LAB — CMP (CANCER CENTER ONLY)
ALT: 17 U/L (ref 0–44)
AST: 23 U/L (ref 15–41)
Albumin: 3.2 g/dL — ABNORMAL LOW (ref 3.5–5.0)
Alkaline Phosphatase: 62 U/L (ref 38–126)
Anion gap: 9 (ref 5–15)
BUN: 23 mg/dL (ref 8–23)
CO2: 27 mmol/L (ref 22–32)
Calcium: 8.8 mg/dL — ABNORMAL LOW (ref 8.9–10.3)
Chloride: 100 mmol/L (ref 98–111)
Creatinine: 0.61 mg/dL (ref 0.61–1.24)
GFR, Estimated: >60 mL/min (ref >60)
Glucose, Bld: 100 mg/dL — ABNORMAL HIGH (ref 70–99)
Potassium: 4.2 mmol/L (ref 3.5–5.1)
Sodium: 136 mmol/L (ref 135–145)
Total Bilirubin: 0.8 mg/dL (ref 0.3–1.2)
Total Protein: 5.5 g/dL — ABNORMAL LOW (ref 6.5–8.1)

## 2021-07-02 LAB — CBC WITH DIFFERENTIAL (CANCER CENTER ONLY)
Abs Immature Granulocytes: 0.98 10*3/uL — ABNORMAL HIGH (ref 0.00–0.07)
Basophils Absolute: 0.1 10*3/uL (ref 0.0–0.1)
Basophils Relative: 1 %
Eosinophils Absolute: 0.3 10*3/uL (ref 0.0–0.5)
Eosinophils Relative: 3 %
HCT: 32.8 % — ABNORMAL LOW (ref 39.0–52.0)
Hemoglobin: 10.6 g/dL — ABNORMAL LOW (ref 13.0–17.0)
Immature Granulocytes: 12 %
Lymphocytes Relative: 3 %
Lymphs Abs: 0.3 10*3/uL — ABNORMAL LOW (ref 0.7–4.0)
MCH: 31.2 pg (ref 26.0–34.0)
MCHC: 32.3 g/dL (ref 30.0–36.0)
MCV: 96.5 fL (ref 80.0–100.0)
Monocytes Absolute: 0.8 10*3/uL (ref 0.1–1.0)
Monocytes Relative: 10 %
Neutro Abs: 6.1 10*3/uL (ref 1.7–7.7)
Neutrophils Relative %: 71 %
Platelet Count: 301 10*3/uL (ref 150–400)
RBC: 3.4 MIL/uL — ABNORMAL LOW (ref 4.22–5.81)
RDW: 20.4 % — ABNORMAL HIGH (ref 11.5–15.5)
WBC Count: 8.5 10*3/uL (ref 4.0–10.5)
nRBC: 0.2 % (ref 0.0–0.2)

## 2021-07-02 MED ORDER — CAPECITABINE 500 MG PO TABS
ORAL_TABLET | ORAL | 0 refills | Status: DC
  Filled 2021-07-02: qty 70, 21d supply, fill #0
  Filled 2021-07-02: qty 70, fill #0

## 2021-07-02 NOTE — Progress Notes (Signed)
[place] OFFICE PROGRESS NOTE   Diagnosis: Head neck cancer  INTERVAL HISTORY:   [name] returns as scheduled.  He completed a cycle of Xeloda beginning 06/14/2021.  No mouth sores, nausea, diarrhea, or hand/foot pain.  He plans to return home on 07/06/2021.  No pain.  The back wounds have healed.  He had a telehealth visit with Dr. Georgeanna Lea.  He was treated for "pneumonia "by the nursing home practitioner last week after he was found to have a low oxygen saturation.  No fever.  Objective:  Vital signs in last 24 hours:  Blood pressure (!) 100/59, pulse 99, temperature 98.7 F (37.1 C), temperature source Oral, resp. rate 18, height _0  (1.778 m), weight 120 lb (54.4 kg), SpO2 100 %.    HEENT: No thrush or ulcers Resp: Decreased breath sounds at the left lower chest, no respiratory distress Cardio: Regular rate and rhythm GI: Nontender, no hepatosplenomegaly Vascular: No leg edema Neuro: He moves both arms and legs to command Skin: Healed back incision  Lab Results:  Lab Results  Component Value Date   WBC 8.5 07/02/2021   HGB 10.6 (L) 07/02/2021   HCT 32.8 (L) 07/02/2021   MCV 96.5 07/02/2021   PLT 301 07/02/2021   NEUTROABS PENDING 07/02/2021    CMP  Lab Results  Component Value Date   NA 136 05/31/2021   K 3.8 05/31/2021   CL 98 05/31/2021   CO2 29 05/31/2021   GLUCOSE 86 05/31/2021   BUN 31 (H) 05/31/2021   CREATININE 0.68 05/31/2021   CALCIUM 8.9 05/31/2021   PROT 5.9 (L) 05/31/2021   ALBUMIN 3.6 05/31/2021   AST 22 05/31/2021   ALT 23 05/31/2021   ALKPHOS 45 05/31/2021   BILITOT 0.7 05/31/2021   GFRNONAA >60 05/31/2021   GFRAA >90 04/23/2021    Medications: I have reviewed the patient's current medications.   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, stage IVa (T2N2bM0) HPV positive 08/2014-09/2014-CRT 70 Gray in 35 fractions with cisplatin  20 mg per metered squared days 1-5, weeks 1 and 5 Recurrent disease April 2019-new left upper lobe cavitary lesion, infiltrative left hilar mass, prevascular and mediastinal adenopathy 09/28/2017-flexible bronchoscopy with EBUS-biopsy station 7 node, squamous cell carcinoma p16 positive 10/2017-12/2018-systemic therapy, carboplatin/docetaxel/pembrolizumab x5 followed by maintenance pembrolizumab, progression in 06/2018, 09/2018, and 12/2018 while on pembrolizumab-continue due to slow mild progression 07/13/2018-disease progression with new left upper lobe posterior segment lesion, new left upper lobe nodule, new left apical nodularity with subsequent scans 10/05/2018 and 01/11/2019 confirming disease progression 10/30/2020-PET add [facility] posterior mediastinal mass involving T5 and T6 and left posterior fifth and sixth ribs, hypermetabolic soft tissue within the prevascular mediastinum contiguous with the posterior mediastinal mass, hypermetabolic spiculated left upper lobe nodules, small left pleural effusion, focal small bowel uptake in left upper quadrant abutting the transverse colon Leg weakness and lower body numbness beginning 03/24/2021, fall prior to presenting to the emergency room 04/01/2021 MRI cervical, thoracic, and lumbar spine level 04/02/2021-no evidence of metastatic disease of the cervical spine, 8 mm enhancing lesion in the inferior aspect of L5-nonspecific, no other evidence of metastatic disease in the lumbar spine, large destructive and infiltrative metastatic deposit centered at the left paraspinous region involving T4-T7 with associated epidural extension with severe spinal stenosis, cord compression, cord signal changes at T4-5 through T6-7, metastatic lesion extends to involve the posterior medial aspect of the left lung and left  mediastinum/hilar region, pathologic fracture of T5, 1 cm enhancing lesion in T11 indeterminate CTs 05/31/2021-no evidence of recurrent disease in  the neck, decreased size of left pleural/mediastinal and thoracic paraspinal mass, minimally improved left pleural effusion, mild enlargement of a superior segment right lower lobe nodule, destructive findings of the left fifth and sixth ribs.  Callus formation associated with a healing fracture of the right anterior fourth rib. Cycle 1 Xeloda 06/14/2021   2.  Spinal cord compression syndrome secondary to the thoracic spine/mediastinal mass Left T5 transpedicular decompression, bilateral T4, T5, T6 laminectomies for resection of epidural tumor 04/03/2021 Metastatic squamous cell carcinoma, Foundation 1-tumor mutation burden 21, MSS, PIK3CA, Palliative radiation to thoracic spine and central chest 04/29/2021       Disposition: Mr. Portman appears stable.  He completed 1 cycle of Xeloda.  Tolerated little well.  The hemoglobin is lower today.  No apparent bleeding.  We will check the CBC when he returns in 3 weeks.  He will begin another cycle of Xeloda on 07/06/2021.  The plan is to complete 4 cycles of Xeloda prior to a restaging chest CT.  Betsy Coder, MD  07/02/2021  9:02 AM

## 2021-07-02 NOTE — Progress Notes (Signed)
Patient being d/c home on 2/14 from SNF. Ordered Xeloda for delivery on 07/05/21 to home address. Sister will be there to accept package. Sent script to [facility].

## 2021-07-05 ENCOUNTER — Non-Acute Institutional Stay (SKILLED_NURSING_FACILITY): Payer: Medicare PPO | Admitting: Adult Health

## 2021-07-05 ENCOUNTER — Encounter: Payer: Self-pay | Admitting: Adult Health

## 2021-07-05 DIAGNOSIS — E039 Hypothyroidism, unspecified: Secondary | ICD-10-CM

## 2021-07-05 DIAGNOSIS — F419 Anxiety disorder, unspecified: Secondary | ICD-10-CM

## 2021-07-05 DIAGNOSIS — G9520 Unspecified cord compression: Secondary | ICD-10-CM

## 2021-07-05 DIAGNOSIS — C7951 Secondary malignant neoplasm of bone: Secondary | ICD-10-CM

## 2021-07-05 DIAGNOSIS — R5381 Other malaise: Secondary | ICD-10-CM

## 2021-07-05 DIAGNOSIS — C099 Malignant neoplasm of tonsil, unspecified: Secondary | ICD-10-CM | POA: Diagnosis not present

## 2021-07-05 DIAGNOSIS — E43 Unspecified severe protein-calorie malnutrition: Secondary | ICD-10-CM

## 2021-07-05 MED ORDER — LORAZEPAM 0.5 MG PO TABS: 0.5000 mg | ORAL_TABLET | Freq: Every day | ORAL | 0 refills | Status: DC | PRN

## 2021-07-05 MED ORDER — LEVOTHYROXINE SODIUM 50 MCG PO TABS: 50.0000 ug | ORAL_TABLET | Freq: Every day | ORAL | 0 refills | Status: AC

## 2021-07-05 NOTE — Progress Notes (Signed)
Location:  [name]: 409-W Place of Service:  SNF (31) Provider:  Durenda Age, DNP, FNP-BC  [name] as PCP - General (Internal Medicine)  Extended Emergency Contact Information Primary Emergency Contact: Bradley Oliver Mobile Phone: [phone] Relation: Brother Secondary Emergency Contact: Bradley Oliver of [place] Phone: [phone] Relation: Mother  Code Status:  DNR  Goals of care: Advanced Directive information Advanced Directives 07/05/2021  Does Patient Have a Medical Advance Directive? Yes  Type of Paramedic of [place];Living will;Out of facility DNR (pink MOST or yellow form)  Does patient want to make changes to medical advance directive? No - Patient declined  Copy of [place] in Chart? Yes - validated most recent copy scanned in chart (See row information)  Pre-existing out of facility DNR order (yellow form or pink MOST form) -     Chief Complaint  Patient presents with   Discharge Note     Discharge from SNF.    HPI:  Pt is a 66 y.o. male who who is for discharge home on 07/06/21 with Home health PT and OT.  He was admitted to [facility] on 04/09/21 post hospital admission 04/01/21 to 04/09/21. 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 He was having weakness/numbness of lower extremities and  had a fall at home and went to [facility] ED on 04/01/21.CTA chest was negative for PE but  showed medial left lung mass extending to mediastinum. MRI lumbar spine showed 8 mm enhancing lesion within the inferior aspect of L5 vertebral body suspicious for metastatic lesion. MRI of thoracic spine showed large destructive and infiltrative metastatic deposit centered at the left paraspinal region with involvement  of T4-T7 vertebral bodies. Associated epidural extension with severe spinal stenosis, cord compression and cord signal changes at the level of T4-5 through T6-7. Metastatic lesion extends to involve the adjacent posterior medial aspect of the left lung as well as the left adjacent mediastinum and left hilar/perihilar region. Associated pathologic fracture of T5, 1 cm enhancing lesion within the T11 vertebral body suspicious for metastatic lesion.  Neurosurgery was consulted and was transferred to [facility] wherein he had decompression, T4 to T7 pedicle screw instrumentation on 04/03/21.  He had radiation treatment on 04/29/21 to 05/13/21 at the [facility] with palliative intent. He is currently having palliative chemotherapy with oral Xeloda.   Past Medical History:  Diagnosis Date   Blood in stool    Cancer ([place]) 08/05/14   right tonsil, P16 positive   Past Surgical History:  Procedure Laterality Date   EYE SURGERY     KNEE SURGERY     LUMBAR PERCUTANEOUS PEDICLE SCREW 4 LEVEL N/A 04/02/2021   Procedure: THORACIC THREE-THORACIC SEVEN, INSTRUMENTATION AND FUSION, THORACIC FOUR-THORACIC SIX LAMINECTOMY, THORACIC FIVE TRANSPEDICULAR DECOMPRESSION;  Surgeon: Dawley, Theodoro Doing, DO;  Location: [place];  Service: Neurosurgery;  Laterality: N/A;   tonsil biopsy Right 08/05/14   invasive squamous cell carcinoma    No Known Allergies  Outpatient Encounter Medications as of 07/05/2021  Medication Sig   bisacodyl (DULCOLAX) 10 MG suppository Place 10 mg rectally as needed for moderate constipation.   capecitabine (XELODA) 500 MG tablet Take 3 tablets (1500mg ) by mouth in AM and 2 tablets (1000mg ) in PM. Take with food. Take for 14 days, then hold for 7 day.  Repeat every 21 days. Start on 07/05/2021   dexamethasone (DECADRON) 2 MG tablet Take 2 mg by mouth daily. QD X 5 Days   doxycycline (VIBRAMYCIN) 100 MG capsule Take 100 mg by mouth 2 (two) times daily.   Ensure Plus (ENSURE  PLUS) LIQD Take 237 mLs by mouth in the morning, at noon, and at bedtime.   levothyroxine (SYNTHROID) 50 MCG tablet Take 50 mcg by mouth daily.   lidocaine (LIDODERM) 5 % Place 1 patch onto the skin daily as needed (pain).   lidocaine (XYLOCAINE) 2 % solution Use as directed 10 mLs in the mouth or throat as needed for mouth pain.   LORazepam (ATIVAN) 0.5 MG tablet Take 1 tablet (0.5 mg total) by mouth daily as needed for anxiety.   Magnesium Hydroxide (MILK OF MAGNESIA PO) Take 30 mLs by mouth as needed.   NON FORMULARY 4 (four) times daily. Magic cup  1 cup   polyethylene glycol (MIRALAX / GLYCOLAX) 17 g packet Take 17 g by mouth daily as needed for mild constipation.   Pramox-PE-Glycerin-Petrolatum (HEMORRHOIDAL EX) Apply 1 application topically 2 (two) times daily as needed.   saccharomyces boulardii (FLORASTOR) 250 MG capsule Take 1 capsule (250 mg total) by mouth 2 (two) times daily for 13 days.   Sodium Phosphates (RA SALINE ENEMA RE) Place 1 Dose rectally as needed.   [DISCONTINUED] Multiple Vitamins-Minerals (MULTIVITAMIN ADULT) TABS Take 1 tablet by mouth daily. with Omega 3   Facility-Administered Encounter Medications as of 07/05/2021  Medication   0.9 %  sodium chloride infusion    Review of Systems  Constitutional:  Negative for activity change, appetite change and fever.  HENT:  Negative for sore throat.   Eyes: Negative.   Respiratory:  Positive for cough.        Dry cough  Cardiovascular:  Negative for chest pain and leg swelling.  Gastrointestinal:  Negative for abdominal distention, diarrhea and vomiting.  Genitourinary:  Negative for dysuria, frequency and urgency.  Skin:  Negative for color change.  Neurological:  Negative for dizziness and headaches.  Psychiatric/Behavioral:  Negative for behavioral problems and sleep disturbance. The patient is not nervous/anxious.       Immunization History  Administered Date(s) Administered   Janssen (J&J) SARS-COV-2  Vaccination 08/31/2019   PPD Test 09/14/2017   Unspecified SARS-COV-2 Vaccination 09/21/2019   Pertinent  Health Maintenance Due  Topic Date Due   COLONOSCOPY (Pts 45-66yrs Insurance coverage will need to be confirmed)  Never done   INFLUENZA VACCINE  Never done   Fall Risk 04/08/2021 04/09/2021 04/20/2021 05/10/2021 07/02/2021  Patient Fall Risk Level High fall risk High fall risk High fall risk High fall risk High fall risk     Vitals:   07/05/21 1336  BP: (!) 83/49  Pulse: 84  Resp: 20  Temp: 97.9 F (36.6 C)  Height: 5\' 10"  (1.778 m)   Body mass index is 17.22 kg/m.  Physical Exam Constitutional:      Appearance: Normal appearance.  HENT:     Head: Normocephalic and atraumatic.     Mouth/Throat:     Mouth: Mucous membranes are moist.  Eyes:     Conjunctiva/sclera: Conjunctivae normal.  Cardiovascular:     Rate and Rhythm: Normal rate and regular rhythm.     Pulses: Normal pulses.     Heart sounds: Normal heart sounds.  Pulmonary:     Effort: Pulmonary effort is normal.     Breath sounds: Normal breath sounds.  Abdominal:  General: Bowel sounds are normal.     Palpations: Abdomen is soft.  Musculoskeletal:        General: No swelling.  Skin:    General: Skin is warm and dry.  Neurological:     Mental Status: He is alert and oriented to person, place, and time. Mental status is at baseline.  Psychiatric:        Mood and Affect: Mood normal.        Behavior: Behavior normal.        Thought Content: Thought content normal.        Judgment: Judgment normal.       Labs reviewed: Recent Labs    04/04/21 0340 04/14/21 0000 04/23/21 0000 05/31/21 1155 07/02/21 0819  NA 133*   < > 135* 136 136  K 3.8   < > 4.2 3.8 4.2  CL 101   < > 96* 98 100  CO2 25   < > 28* 29 27  GLUCOSE 148*  --   --  86 100*  BUN 17   < > 28* 31* 23  CREATININE 0.69   < > 0.6 0.68 0.61  CALCIUM 8.5*   < > 9.0 8.9 8.8*   < > = values in this interval not displayed.    Recent Labs    04/04/21 0340 05/31/21 1155 07/02/21 0819  AST 31 22 23   ALT 11 23 17   ALKPHOS 58 45 62  BILITOT 0.8 0.7 0.8  PROT 5.2* 5.9* 5.5*  ALBUMIN 3.0* 3.6 3.2*   Recent Labs    04/01/21 1912 04/02/21 2341 04/04/21 0340 04/14/21 0000 04/23/21 0000 05/31/21 1155 07/02/21 0819  WBC 6.6   < > 11.7*   < > 9.8 7.3 8.5  NEUTROABS 5.4  --   --   --   --  5.9 6.1  HGB 14.0   < > 12.2*   < > 13.3* 14.1 10.6*  HCT 40.9   < > 35.8*   < > 39* 42.8 32.8*  MCV 88.1   < > 85.6  --   --  91.8 96.5  PLT 274   < > 224   < > 214 191 301   < > = values in this interval not displayed.   Lab Results  Component Value Date   TSH 3.287 05/31/2021   No results found for: HGBA1C Lab Results  Component Value Date   CHOL 164 09/11/2020   HDL 48 09/11/2020   LDLCALC 95 09/11/2020   TRIG 118 09/11/2020   CHOLHDL 3.4 09/11/2020    Significant Diagnostic Results in last 30 days:  No results found.  Assessment/Plan  1. Squamous cell carcinoma of right tonsil (HCC) Cord compression (HCC) Bone metastases ([place]) -  currently having palliative chemotherapy with oral Xeloda -  follows up with [facility]  2. Acquired hypothyroidism Lab Results  Component Value Date   TSH 3.287 05/31/2021   - levothyroxine (SYNTHROID) 50 MCG tablet; Take 1 tablet (50 mcg total) by mouth daily.  Dispense: 30 tablet; Refill: 0  3. Anxiety - LORazepam (ATIVAN) 0.5 MG tablet; Take 1 tablet (0.5 mg total) by mouth daily as needed for anxiety.  Dispense: 15 tablet; Refill: 0  4. Protein-calorie malnutrition, severe Wt Readings from Last 3 Encounters:  07/02/21 120 lb (54.4 kg)  06/23/21 118 lb 3.2 oz (53.6 kg)  06/04/21 107 lb (48.5 kg)   -  continue supplementation with magic cup and Ensure Enlive  5.  Physical deconditioning -    For home health PT and OT, for therapeutic and strengthening exercises      I have filled out patient's discharge paperwork and e-prescribed  medications.  Patient will have home health PT and OT.  DME provided:   None  Total discharge time: Greater than 30 minutes Greater than 50% was spent in counseling and coordination of care.    Discharge time involved coordination of the discharge process with social worker, nursing staff and therapy department. Medical justification for home health services verified.    Durenda Age, DNP, MSN, FNP-BC [facility] [phone] (Monday-Friday 8:00 a.m. - 5:00 p.m.) [phone] (after hours)

## 2021-07-19 ENCOUNTER — Other Ambulatory Visit (HOSPITAL_COMMUNITY): Payer: Self-pay

## 2021-07-20 ENCOUNTER — Other Ambulatory Visit: Payer: Self-pay | Admitting: Oncology

## 2021-07-20 ENCOUNTER — Encounter: Payer: Self-pay | Admitting: General Practice

## 2021-07-20 ENCOUNTER — Inpatient Hospital Stay (HOSPITAL_BASED_OUTPATIENT_CLINIC_OR_DEPARTMENT_OTHER): Payer: Medicare PPO | Admitting: Nurse Practitioner

## 2021-07-20 ENCOUNTER — Other Ambulatory Visit (HOSPITAL_COMMUNITY): Payer: Self-pay

## 2021-07-20 DIAGNOSIS — C7951 Secondary malignant neoplasm of bone: Secondary | ICD-10-CM

## 2021-07-20 DIAGNOSIS — C099 Malignant neoplasm of tonsil, unspecified: Secondary | ICD-10-CM | POA: Diagnosis not present

## 2021-07-20 DIAGNOSIS — Z515 Encounter for palliative care: Secondary | ICD-10-CM | POA: Diagnosis not present

## 2021-07-20 DIAGNOSIS — R530 Neoplastic (malignant) related fatigue: Secondary | ICD-10-CM | POA: Diagnosis not present

## 2021-07-20 DIAGNOSIS — K5900 Constipation, unspecified: Secondary | ICD-10-CM

## 2021-07-20 NOTE — Progress Notes (Signed)
[facility] Spiritual Care Note  Reached Bradley Oliver by phone as planned for follow-up support. 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 He reports using his professional experience and insight as a means to contribute, for example, by advising about public policy and assisting with needs in buildings he has lived in.   Provided empathic listening, normalized feelings, and served as a witness to his knowledge and values. We plan to follow up by phone in ca one month for another pastoral check-in.   [place], [place], [facility] Pager [phone] Voicemail [phone]

## 2021-07-20 NOTE — Telephone Encounter (Signed)
Next cycle not due until 07/27/21. Will see patient in office on 3/2 and then refill based on tolerance.

## 2021-07-20 NOTE — Progress Notes (Signed)
[place]  [phone] [phone]   Name: Bradley Oliver Date: 07/20/2021 MRN: [id number]  DOB: 07/04/1955  Patient Care Team: [place] as PCP - General (Internal Medicine)   I connected with Bradley Oliver on 07/20/21 at  1:30 PM EST by telephone and verified that I am speaking with the correct person using two identifiers.   I discussed the limitations, risks, security and privacy concerns of performing an evaluation and management service by telemedicine and the availability of in-person appointments. I also discussed with the patient that there may be a patient responsible charge related to this service. The patient expressed understanding and agreed to proceed.   Other persons participating in the visit and their role in the encounter: N/A   Patients location: Home  Providers location: [place]: Bradley Oliver is a 66 y.o. 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  Recent hospitalization and currently at [facility] facility for rehab. Palliative ask to see for symptom management and goals of care.    SOCIAL HISTORY:     reports that he has quit smoking. He has never used smokeless tobacco. He reports that he does not currently use alcohol. He reports that he does not use drugs.  ADVANCE DIRECTIVES:  Patient does have a completed advanced directive. His [name] is his HCPOA. MOST form completed today. Patient given DNR out-of-facility form.   CODE STATUS: DNR  PAST MEDICAL HISTORY: Past Medical History:  Diagnosis Date   Blood in stool    Cancer ([place]) 08/05/14   right tonsil, P16 positive    PAST SURGICAL HISTORY:  Past Surgical History:  Procedure Laterality  Date   EYE SURGERY     KNEE SURGERY     LUMBAR PERCUTANEOUS PEDICLE SCREW 4 LEVEL N/A 04/02/2021   Procedure: THORACIC THREE-THORACIC SEVEN, INSTRUMENTATION AND FUSION, THORACIC FOUR-THORACIC SIX LAMINECTOMY, THORACIC FIVE TRANSPEDICULAR DECOMPRESSION;  Surgeon: Dawley, Theodoro Doing, DO;  Location: [place];  Service: Neurosurgery;  Laterality: N/A;   tonsil biopsy Right 08/05/14   invasive squamous cell carcinoma    HEMATOLOGY/ONCOLOGY HISTORY:  Oncology History   No history exists.    ALLERGIES:  has No Known Allergies.  MEDICATIONS:  Current Outpatient Medications  Medication Sig Dispense Refill   bisacodyl (DULCOLAX) 10 MG suppository Place 10 mg rectally as needed for moderate constipation.     capecitabine (XELODA) 500 MG tablet Take 3 tablets (1500mg ) by mouth in AM and 2 tablets (1000mg ) in PM. Take with food. Take for 14 days, then hold for 7 day. Repeat every 21 days. Start on 07/05/2021 70 tablet 0   Ensure Plus (ENSURE PLUS) LIQD Take 237 mLs by mouth in the morning, at noon, and at bedtime.     levothyroxine (SYNTHROID) 50 MCG tablet Take 1 tablet (50 mcg total) by mouth daily. 30 tablet 0   lidocaine (LIDODERM) 5 % Place 1 patch onto the skin daily as needed (pain).     lidocaine (XYLOCAINE) 2 % solution Use as directed 10 mLs in the mouth or throat as needed for mouth pain.     LORazepam (ATIVAN) 0.5 MG tablet Take 1 tablet (0.5 mg total) by mouth daily as needed for anxiety. 15 tablet 0   Magnesium Hydroxide (MILK OF MAGNESIA PO) Take 30  mLs by mouth as needed.     NON FORMULARY 4 (four) times daily. Magic cup  1 cup     polyethylene glycol (MIRALAX / GLYCOLAX) 17 g packet Take 17 g by mouth daily as needed for mild constipation. 14 each 0   Pramox-PE-Glycerin-Petrolatum (HEMORRHOIDAL EX) Apply 1 application topically 2 (two) times daily as needed.     Sodium Phosphates (RA SALINE ENEMA RE) Place 1 Dose rectally as needed.     No current facility-administered medications for this  visit.   PERFORMANCE STATUS (ECOG) : 2 - Symptomatic, <50% confined to bed   IMPRESSION:  I connected with Bradley Oliver today via phone. He is grateful to be back at home. Shares he was discharged from facility on February 14th. His family relocated his room downstairs for ease in maneuvering around the home. Physical therapy is scheduled to come out to his home this week and resume services.   He is sleeping better. Shares at peace being at home with his family.   Appetite is improving. He is able to make appropriate food choices and eat some of the foods he missed while at rehab facility. Continues to require soft foods due to his esophageal dysmotility. He is drinking 2-3 ensures daily.  Continued to encourage patient to push fluids specifically water to maintain adequate hydration.   He does some constipation. He was initially taking Miralax every other day. Advised to begin taking daily which he agrees, however does not wish to to take over the weekend.   Denies pain, shortness of breath, or nausea/vomiting.   Shares he has been trying to perform his own exercises in the home. He is actively using ankle weights and upper extremity.   He seems to be doing well at home thus far. No symptomatic needs.   I discussed the importance of continued conversation with family and their medical providers regarding overall plan of care and treatment options, ensuring decisions are within the context of the patients values and GOCs.  PLAN: Patient Encouraged ongoing goals of care discussions. Palliative will continue to offer support. He has returned home after months in rehab. Doing well with plans to continue with home health/PT.  Encouraged increased nutrition, hydration, and to continue with protein shakes.  I will plan to see him back in a month for follow-up via phone. Patient also to be supported by [facility]'s outpatient Palliative.   Patient expressed understanding and was in agreement  with this plan. He also understands that He can call the clinic at any time with any questions, concerns, or complaints.   Time Total: 45 min.   Visit consisted of counseling and education dealing with the complex and emotionally intense issues of symptom management and palliative care in the setting of serious and potentially life-threatening illness.Greater than 50%  of this time was spent counseling and coordinating care related to the above assessment and plan.  Alda Lea, AGPCNP-BC  Palliative Medicine Team/[place]

## 2021-07-21 ENCOUNTER — Telehealth: Payer: Self-pay | Admitting: *Deleted

## 2021-07-21 NOTE — Telephone Encounter (Signed)
Was informed by radiation oncology that patient wanted a call from office re: medication. Informed him that xeloda will be refilled after MD visit tomorrow. He was asking about his lorazepam, citing he has sensation as if an electrical storm is going on across his chest that originates in plexus muscle. Episode causes coughing, shortness of breath and skin feels electrical. Occurs on occasion, but not daily. Lorazepam helps this. Informed him he has script that was sent in to his pharmacy on 2/13 by Medina-Vargas, Monina C, NP. He was not aware of this. He will f/u with pharmacy. ?

## 2021-07-22 ENCOUNTER — Inpatient Hospital Stay: Payer: Medicare PPO | Attending: Internal Medicine

## 2021-07-22 ENCOUNTER — Other Ambulatory Visit (HOSPITAL_COMMUNITY): Payer: Self-pay

## 2021-07-22 ENCOUNTER — Other Ambulatory Visit: Payer: Self-pay

## 2021-07-22 ENCOUNTER — Inpatient Hospital Stay (HOSPITAL_BASED_OUTPATIENT_CLINIC_OR_DEPARTMENT_OTHER): Payer: Medicare PPO | Admitting: Oncology

## 2021-07-22 VITALS — BP 97/71 | HR 98 | Temp 98.1°F | Resp 18 | Ht 70.0 in | Wt 120.0 lb

## 2021-07-22 DIAGNOSIS — C7800 Secondary malignant neoplasm of unspecified lung: Secondary | ICD-10-CM | POA: Diagnosis not present

## 2021-07-22 DIAGNOSIS — C7951 Secondary malignant neoplasm of bone: Secondary | ICD-10-CM | POA: Diagnosis present

## 2021-07-22 DIAGNOSIS — C099 Malignant neoplasm of tonsil, unspecified: Secondary | ICD-10-CM | POA: Diagnosis not present

## 2021-07-22 LAB — CBC WITH DIFFERENTIAL (CANCER CENTER ONLY)
Abs Immature Granulocytes: 0.27 10*3/uL — ABNORMAL HIGH (ref 0.00–0.07)
Basophils Absolute: 0 10*3/uL (ref 0.0–0.1)
Basophils Relative: 0 %
Eosinophils Absolute: 0.5 10*3/uL (ref 0.0–0.5)
Eosinophils Relative: 7 %
HCT: 33.1 % — ABNORMAL LOW (ref 39.0–52.0)
Hemoglobin: 10.7 g/dL — ABNORMAL LOW (ref 13.0–17.0)
Immature Granulocytes: 4 %
Lymphocytes Relative: 4 %
Lymphs Abs: 0.3 10*3/uL — ABNORMAL LOW (ref 0.7–4.0)
MCH: 32.9 pg (ref 26.0–34.0)
MCHC: 32.3 g/dL (ref 30.0–36.0)
MCV: 101.8 fL — ABNORMAL HIGH (ref 80.0–100.0)
Monocytes Absolute: 0.7 10*3/uL (ref 0.1–1.0)
Monocytes Relative: 11 %
Neutro Abs: 5.3 10*3/uL (ref 1.7–7.7)
Neutrophils Relative %: 74 %
Platelet Count: 339 10*3/uL (ref 150–400)
RBC: 3.25 MIL/uL — ABNORMAL LOW (ref 4.22–5.81)
RDW: 23 % — ABNORMAL HIGH (ref 11.5–15.5)
WBC Count: 7.1 10*3/uL (ref 4.0–10.5)
nRBC: 0 % (ref 0.0–0.2)

## 2021-07-22 LAB — CMP (CANCER CENTER ONLY)
ALT: 9 U/L (ref 0–44)
AST: 19 U/L (ref 15–41)
Albumin: 3.7 g/dL (ref 3.5–5.0)
Alkaline Phosphatase: 77 U/L (ref 38–126)
Anion gap: 14 (ref 5–15)
BUN: 28 mg/dL — ABNORMAL HIGH (ref 8–23)
CO2: 24 mmol/L (ref 22–32)
Calcium: 9.2 mg/dL (ref 8.9–10.3)
Chloride: 96 mmol/L — ABNORMAL LOW (ref 98–111)
Creatinine: 0.65 mg/dL (ref 0.61–1.24)
GFR, Estimated: >60 mL/min (ref >60)
Glucose, Bld: 88 mg/dL (ref 70–99)
Potassium: 3.6 mmol/L (ref 3.5–5.1)
Sodium: 134 mmol/L — ABNORMAL LOW (ref 135–145)
Total Bilirubin: 1.1 mg/dL (ref 0.3–1.2)
Total Protein: 6.2 g/dL — ABNORMAL LOW (ref 6.5–8.1)

## 2021-07-22 MED ORDER — CAPECITABINE 500 MG PO TABS
ORAL_TABLET | ORAL | 0 refills | Status: DC
  Filled 2021-07-22: qty 70, 21d supply, fill #0

## 2021-07-22 NOTE — Progress Notes (Signed)
?[place] ?OFFICE PROGRESS NOTE ? ? ?Diagnosis: Head neck cancer ? ?INTERVAL HISTORY:  ? ?[name] returns as scheduled.  He completed another cycle of Xeloda beginning 07/05/2021.  No mouth sores, nausea, diarrhea, or hand/foot pain.  He reports no discoloration over the feet.  He has returned home.  He continues to work on his mobility. ? ?Objective: ? ?Vital signs in last 24 hours: ? ?Blood pressure 97/71, pulse 98, temperature 98.1 ?F (36.7 ?C), temperature source Oral, resp. rate 18, height _0  (1.778 m), weight 120 lb (54.4 kg), SpO2 100 %. ?  ? ?HEENT: No thrush or ulcers ?Resp: Clear bilaterally ?Cardio: Regular rate and rhythm ?GI: Nontender, no hepatosplenomegaly ?Vascular: No leg edema  ?Skin: Palms without erythema ? ? ?Lab Results: ? ?Lab Results  ?Component Value Date  ? WBC 7.1 07/22/2021  ? HGB 10.7 (L) 07/22/2021  ? HCT 33.1 (L) 07/22/2021  ? MCV 101.8 (H) 07/22/2021  ? PLT 339 07/22/2021  ? NEUTROABS 5.3 07/22/2021  ? ? ?CMP  ?Lab Results  ?Component Value Date  ? NA 134 (L) 07/22/2021  ? K 3.6 07/22/2021  ? CL 96 (L) 07/22/2021  ? CO2 24 07/22/2021  ? GLUCOSE 88 07/22/2021  ? BUN 28 (H) 07/22/2021  ? CREATININE 0.65 07/22/2021  ? CALCIUM 9.2 07/22/2021  ? PROT 6.2 (L) 07/22/2021  ? ALBUMIN 3.7 07/22/2021  ? AST 19 07/22/2021  ? ALT 9 07/22/2021  ? ALKPHOS 77 07/22/2021  ? BILITOT 1.1 07/22/2021  ? GFRNONAA >60 07/22/2021  ? GFRAA >90 04/23/2021  ? ? ? ?Medications: I have reviewed the patient's current medications. ? ? 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% ?Diagnosed March 2016, stage IVa (T2N2bM0) HPV positive ?08/2014-09/2014-CRT 70 Gray in 35 fractions with cisplatin 20 mg per metered squared days 1-5, weeks 1 and 5 ?Recurrent disease April 2019-new left upper lobe cavitary lesion, infiltrative left hilar mass, prevascular and mediastinal adenopathy ?09/28/2017-flexible bronchoscopy with EBUS-biopsy station  7 node, squamous cell carcinoma p16 positive ?10/2017-12/2018-systemic therapy, carboplatin/docetaxel/pembrolizumab x5 followed by maintenance pembrolizumab, progression in 06/2018, 09/2018, and 12/2018 while on pembrolizumab-continue due to slow mild progression ?07/13/2018-disease progression with new left upper lobe posterior segment lesion, new left upper lobe nodule, new left apical nodularity with subsequent scans 10/05/2018 and 01/11/2019 confirming disease progression ?10/30/2020-PET add [facility] posterior mediastinal mass involving T5 and T6 and left posterior fifth and sixth ribs, hypermetabolic soft tissue within the prevascular mediastinum contiguous with the posterior mediastinal mass, hypermetabolic spiculated left upper lobe nodules, small left pleural effusion, focal small bowel uptake in left upper quadrant abutting the transverse colon ?Leg weakness and lower body numbness beginning 03/24/2021, fall prior to presenting to the emergency room 04/01/2021 ?MRI cervical, thoracic, and lumbar spine level 04/02/2021-no evidence of metastatic disease of the cervical spine, 8 mm enhancing lesion in the inferior aspect of L5-nonspecific, no other evidence of metastatic disease in the lumbar spine, large destructive and infiltrative metastatic deposit centered at the left paraspinous region involving T4-T7 with associated epidural extension with severe spinal stenosis, cord compression, cord signal changes at T4-5 through T6-7, metastatic lesion extends to involve the posterior medial aspect of the left lung and left mediastinum/hilar region, pathologic fracture of T5, 1 cm enhancing lesion in T11 indeterminate ?CTs 05/31/2021-no evidence of recurrent disease in the neck, decreased size of left pleural/mediastinal and thoracic paraspinal mass, minimally improved left pleural effusion, mild enlargement of a superior segment right lower  lobe nodule, destructive findings of the left fifth and sixth ribs.   Callus formation associated with a healing fracture of the right anterior fourth rib. ?Cycle 1 Xeloda 06/14/2021 ?Cycle 2 Xeloda 07/05/2021 ?Cycle 3 Xeloda 07/26/2021 ?  ?2.  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  ? ? ? ? ? ?Disposition: ?[name] appears stable.  He is tolerating Xeloda well.  He will begin cycle 3 on 07/26/2021.  He will be referred for a restaging chest CT after cycle 4. ? ?He will return for an office and lab visit in 3 weeks.  He will continue home physical therapy. ? ?Betsy Coder, MD ? ?07/22/2021  ?12:40 PM ? ? ?

## 2021-07-23 ENCOUNTER — Encounter: Payer: Self-pay | Admitting: Oncology

## 2021-07-23 ENCOUNTER — Other Ambulatory Visit: Payer: Medicare PPO

## 2021-07-23 ENCOUNTER — Ambulatory Visit: Payer: Medicare PPO | Admitting: Oncology

## 2021-07-26 ENCOUNTER — Other Ambulatory Visit: Payer: Self-pay | Admitting: *Deleted

## 2021-07-26 ENCOUNTER — Encounter: Payer: Self-pay | Admitting: *Deleted

## 2021-07-26 DIAGNOSIS — C099 Malignant neoplasm of tonsil, unspecified: Secondary | ICD-10-CM

## 2021-07-26 DIAGNOSIS — C7951 Secondary malignant neoplasm of bone: Secondary | ICD-10-CM

## 2021-07-26 NOTE — Progress Notes (Signed)
Placed referral at patient's request to [facility]. Will make him aware that RN was told it is not for home PT-he will need to come to the clinic and this practice is not a preferred provider for his insurance. ?

## 2021-08-02 ENCOUNTER — Encounter: Payer: Self-pay | Admitting: Oncology

## 2021-08-04 ENCOUNTER — Other Ambulatory Visit (HOSPITAL_COMMUNITY): Payer: Self-pay

## 2021-08-05 ENCOUNTER — Other Ambulatory Visit: Payer: Self-pay | Admitting: Oncology

## 2021-08-05 ENCOUNTER — Other Ambulatory Visit: Payer: Self-pay | Admitting: Internal Medicine

## 2021-08-05 ENCOUNTER — Other Ambulatory Visit (HOSPITAL_COMMUNITY): Payer: Self-pay

## 2021-08-06 LAB — BASIC METABOLIC PANEL WITH GFR
BUN/Creatinine Ratio: 40 (calc) — ABNORMAL HIGH (ref 6–22)
BUN: 22 mg/dL (ref 7–25)
CO2: 24 mmol/L (ref 20–32)
Calcium: 9.4 mg/dL (ref 8.6–10.3)
Chloride: 97 mmol/L — ABNORMAL LOW (ref 98–110)
Creat: 0.55 mg/dL — ABNORMAL LOW (ref 0.70–1.35)
Glucose, Bld: 90 mg/dL (ref 65–99)
Potassium: 4.1 mmol/L (ref 3.5–5.3)
Sodium: 137 mmol/L (ref 135–146)
eGFR: 111 mL/min/{1.73_m2} (ref >=60)

## 2021-08-06 LAB — CBC
HCT: 35.3 % — ABNORMAL LOW (ref 38.5–50.0)
Hemoglobin: 11.8 g/dL — ABNORMAL LOW (ref 13.2–17.1)
MCH: 34 pg — ABNORMAL HIGH (ref 27.0–33.0)
MCHC: 33.4 g/dL (ref 32.0–36.0)
MCV: 101.7 fL — ABNORMAL HIGH (ref 80.0–100.0)
MPV: 9.4 fL (ref 7.5–12.5)
Platelets: 315 10*3/uL (ref 140–400)
RBC: 3.47 10*6/uL — ABNORMAL LOW (ref 4.20–5.80)
RDW: 19.9 % — ABNORMAL HIGH (ref 11.0–15.0)
WBC: 5.7 10*3/uL (ref 3.8–10.8)

## 2021-08-06 LAB — LIPID PANEL
Cholesterol: 188 mg/dL (ref <200)
HDL: 54 mg/dL (ref >=40)
LDL Cholesterol (Calc): 112 mg/dL (calc) — ABNORMAL HIGH
Non-HDL Cholesterol (Calc): 134 mg/dL (calc) — ABNORMAL HIGH (ref <130)
Total CHOL/HDL Ratio: 3.5 (calc) (ref <5.0)
Triglycerides: 117 mg/dL (ref <150)

## 2021-08-06 LAB — VITAMIN D 25 HYDROXY (VIT D DEFICIENCY, FRACTURES): Vit D, 25-Hydroxy: 39 ng/mL (ref 30–100)

## 2021-08-06 LAB — TSH: TSH: 2.99 mIU/L (ref 0.40–4.50)

## 2021-08-06 LAB — T4, FREE: Free T4: 1.4 ng/dL (ref 0.8–1.8)

## 2021-08-06 MED ORDER — CAPECITABINE 500 MG PO TABS
ORAL_TABLET | ORAL | 0 refills | Status: DC
  Filled 2021-08-16: qty 70, 21d supply, fill #0

## 2021-08-09 ENCOUNTER — Other Ambulatory Visit (HOSPITAL_COMMUNITY): Payer: Self-pay

## 2021-08-11 ENCOUNTER — Other Ambulatory Visit (HOSPITAL_COMMUNITY): Payer: Self-pay

## 2021-08-13 ENCOUNTER — Inpatient Hospital Stay: Payer: Medicare PPO | Admitting: Oncology

## 2021-08-13 ENCOUNTER — Telehealth: Payer: Self-pay

## 2021-08-13 ENCOUNTER — Inpatient Hospital Stay: Payer: Medicare PPO

## 2021-08-13 NOTE — Telephone Encounter (Signed)
TC from Pt stating he had been coughing violently and that caused him to vomit repeatedly. Pt stated he hasn't vomited since this morning. And stated he has been drinking water and it stayed down so far. Informed Pt he should drink small sips and if he has some Gatorade he should try to drink that. Offered Pt to come in today to receive fluids. Pt declined and stated he would reschedule his appointments for today.  ?

## 2021-08-16 ENCOUNTER — Inpatient Hospital Stay: Payer: Medicare PPO | Admitting: Oncology

## 2021-08-16 ENCOUNTER — Other Ambulatory Visit (HOSPITAL_COMMUNITY): Payer: Self-pay

## 2021-08-16 ENCOUNTER — Other Ambulatory Visit: Payer: Self-pay

## 2021-08-16 ENCOUNTER — Inpatient Hospital Stay: Payer: Medicare PPO

## 2021-08-16 ENCOUNTER — Encounter: Payer: Self-pay | Admitting: *Deleted

## 2021-08-16 VITALS — BP 98/64 | HR 70 | Temp 97.8°F | Resp 18 | Ht 70.0 in

## 2021-08-16 DIAGNOSIS — C099 Malignant neoplasm of tonsil, unspecified: Secondary | ICD-10-CM

## 2021-08-16 LAB — CBC WITH DIFFERENTIAL (CANCER CENTER ONLY)
Abs Immature Granulocytes: 0.1 10*3/uL — ABNORMAL HIGH (ref 0.00–0.07)
Basophils Absolute: 0 10*3/uL (ref 0.0–0.1)
Basophils Relative: 1 %
Eosinophils Absolute: 0.4 10*3/uL (ref 0.0–0.5)
Eosinophils Relative: 7 %
HCT: 38.3 % — ABNORMAL LOW (ref 39.0–52.0)
Hemoglobin: 12.4 g/dL — ABNORMAL LOW (ref 13.0–17.0)
Immature Granulocytes: 2 %
Lymphocytes Relative: 6 %
Lymphs Abs: 0.4 10*3/uL — ABNORMAL LOW (ref 0.7–4.0)
MCH: 33.8 pg (ref 26.0–34.0)
MCHC: 32.4 g/dL (ref 30.0–36.0)
MCV: 104.4 fL — ABNORMAL HIGH (ref 80.0–100.0)
Monocytes Absolute: 0.5 10*3/uL (ref 0.1–1.0)
Monocytes Relative: 8 %
Neutro Abs: 4.8 10*3/uL (ref 1.7–7.7)
Neutrophils Relative %: 76 %
Platelet Count: 322 10*3/uL (ref 150–400)
RBC: 3.67 MIL/uL — ABNORMAL LOW (ref 4.22–5.81)
RDW: 18.7 % — ABNORMAL HIGH (ref 11.5–15.5)
WBC Count: 6.2 10*3/uL (ref 4.0–10.5)
nRBC: 0 % (ref 0.0–0.2)

## 2021-08-16 LAB — CMP (CANCER CENTER ONLY)
ALT: 6 U/L (ref 0–44)
AST: 17 U/L (ref 15–41)
Albumin: 3.8 g/dL (ref 3.5–5.0)
Alkaline Phosphatase: 76 U/L (ref 38–126)
Anion gap: 17 — ABNORMAL HIGH (ref 5–15)
BUN: 22 mg/dL (ref 8–23)
CO2: 23 mmol/L (ref 22–32)
Calcium: 9.8 mg/dL (ref 8.9–10.3)
Chloride: 99 mmol/L (ref 98–111)
Creatinine: 0.69 mg/dL (ref 0.61–1.24)
GFR, Estimated: >60 mL/min (ref >60)
Glucose, Bld: 157 mg/dL — ABNORMAL HIGH (ref 70–99)
Potassium: 4 mmol/L (ref 3.5–5.1)
Sodium: 139 mmol/L (ref 135–145)
Total Bilirubin: 0.9 mg/dL (ref 0.3–1.2)
Total Protein: 6.4 g/dL — ABNORMAL LOW (ref 6.5–8.1)

## 2021-08-16 NOTE — Progress Notes (Signed)
?[place] ?OFFICE PROGRESS NOTE ? ? ?Diagnosis: Head neck cancer ? ?INTERVAL HISTORY:  ? ?Bradley Oliver returns as scheduled.  He completed another cycle Xeloda beginning 07/26/2021.  No mouth sores, nausea, or diarrhea.  He had vomiting in the early a.m. on 08/13/2021.  He relates the vomiting to "movement ".  No hand or foot pain.  No chest or back pain.  He is unable to ambulate secondary to coordination difficulty.  He continues home physical therapy.  He is able to self transfer. ? ?Objective: ? ?Vital signs in last 24 hours: ? ?Blood pressure 98/64, pulse 70, temperature 97.8 ?F (36.6 ?C), temperature source Oral, resp. rate 18, height 5' 10"  (1.778 m), SpO2 96 %. ?  ? ?HEENT: No thrush or ulcers ?Lymphatics: No cervical, supraclavicular, or axillary nodes ?Resp: Lungs clear bilaterally ?Cardio: Regular rate and rhythm ?GI: Nontender, no hepatosplenomegaly ?Vascular: No leg edema ?Neuro: The motor exam appears intact in the upper and lower extremities bilaterally ?Skin: The back incision has healed ? ?Portacath/PICC-without erythema ? ?Lab Results: ? ?Lab Results  ?Component Value Date  ? WBC 6.2 08/16/2021  ? HGB 12.4 (L) 08/16/2021  ? HCT 38.3 (L) 08/16/2021  ? MCV 104.4 (H) 08/16/2021  ? PLT 322 08/16/2021  ? NEUTROABS 4.8 08/16/2021  ? ? ?CMP  ?Lab Results  ?Component Value Date  ? NA 137 08/05/2021  ? K 4.1 08/05/2021  ? CL 97 (L) 08/05/2021  ? CO2 24 08/05/2021  ? GLUCOSE 90 08/05/2021  ? BUN 22 08/05/2021  ? CREATININE 0.55 (L) 08/05/2021  ? CALCIUM 9.4 08/05/2021  ? PROT 6.2 (L) 07/22/2021  ? ALBUMIN 3.7 07/22/2021  ? AST 19 07/22/2021  ? ALT 9 07/22/2021  ? ALKPHOS 77 07/22/2021  ? BILITOT 1.1 07/22/2021  ? GFRNONAA >60 07/22/2021  ? GFRAA >90 04/23/2021  ? ? ?No results found for: CEA1, CEA, [id number], CA125 ? ?No results found for: INR, LABPROT ? ?Imaging: ? ?No results found. ? ?Medications: I have reviewed the patient's current medications. ? ? ?Assessment/Plan: ?Squamous cell carcinoma  the right tonsil, guardant 360-MSS, PIK3CA mutation, PD-L1 TPS 10% ?Diagnosed March 2016, stage IVa (T2N2bM0) HPV positive ?08/2014-09/2014-CRT 70 Gray in 35 fractions with cisplatin 20 mg per metered squared days 1-5, weeks 1 and 5 ?Recurrent disease April 2019-new left upper lobe cavitary lesion, infiltrative left hilar mass, prevascular and mediastinal adenopathy ?09/28/2017-flexible bronchoscopy with EBUS-biopsy station 7 node, squamous cell carcinoma p16 positive ?10/2017-12/2018-systemic therapy, carboplatin/docetaxel/pembrolizumab x5 followed by maintenance pembrolizumab, progression in 06/2018, 09/2018, and 12/2018 while on pembrolizumab-continue due to slow mild progression ?07/13/2018-disease progression with new left upper lobe posterior segment lesion, new left upper lobe nodule, new left apical nodularity with subsequent scans 10/05/2018 and 01/11/2019 confirming disease progression ?10/30/2020-PET add [facility] posterior mediastinal mass involving T5 and T6 and left posterior fifth and sixth ribs, hypermetabolic soft tissue within the prevascular mediastinum contiguous with the posterior mediastinal mass, hypermetabolic spiculated left upper lobe nodules, small left pleural effusion, focal small bowel uptake in left upper quadrant abutting the transverse colon ?Leg weakness and lower body numbness beginning 03/24/2021, fall prior to presenting to the emergency room 04/01/2021 ?MRI cervical, thoracic, and lumbar spine level 04/02/2021-no evidence of metastatic disease of the cervical spine, 8 mm enhancing lesion in the inferior aspect of L5-nonspecific, no other evidence of metastatic disease in the lumbar spine, large destructive and infiltrative metastatic deposit centered at the left paraspinous region involving T4-T7 with associated epidural extension with severe spinal stenosis,  cord compression, cord signal changes at T4-5 through T6-7, metastatic lesion extends to involve the posterior medial  aspect of the left lung and left mediastinum/hilar region, pathologic fracture of T5, 1 cm enhancing lesion in T11 indeterminate ?CTs 05/31/2021-no evidence of recurrent disease in the neck, decreased size of left pleural/mediastinal and thoracic paraspinal mass, minimally improved left pleural effusion, mild enlargement of a superior segment right lower lobe nodule, destructive findings of the left fifth and sixth ribs.  Callus formation associated with a healing fracture of the right anterior fourth rib. ?Cycle 1 Xeloda 06/14/2021 ?Cycle 2 Xeloda 07/05/2021 ?Cycle 3 Xeloda 07/26/2021 ?Cycle 4 Xeloda 08/16/2021 ?  ?2.  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  ? ? ?Disposition: ?Mr. Formica has completed 3 cycles of Xeloda.  He has tolerated the Xeloda well.  He will complete cycle 4 beginning today.  The etiology of the vomiting last week is unclear.  He relates the vomiting to coughing and position change. ? ?He appears well today.  He will undergo a restaging CT evaluation prior to an office visit in 3 weeks. ? ?Mr. Willcox continues home physical therapy. ? ?Betsy Coder, MD ? ?08/16/2021  ?12:05 PM ? ? ?

## 2021-08-17 ENCOUNTER — Inpatient Hospital Stay: Payer: Medicare PPO | Admitting: Nurse Practitioner

## 2021-08-18 ENCOUNTER — Encounter: Payer: Self-pay | Admitting: General Practice

## 2021-08-18 NOTE — Progress Notes (Signed)
[facility] Note ? ?Followed up with Josefa Half by phone for pastoral check-in and emotional support. He shared health updates, plan for scans, and the fact that he doesn't fear death and can approach it in a very matter-of-fact way. He also led wide-ranging conversation that demonstrated his deep knowledge of and passion for history within a social justice framework. ? ?Provided empathic listening and normalization of feelings. Plan to follow up by phone next month to check in after scans. ? ? ?Chaplain Lorrin Jackson, MDiv, [facility] ?Pager [phone] ?Voicemail [phone]  ?

## 2021-08-31 ENCOUNTER — Other Ambulatory Visit (HOSPITAL_COMMUNITY): Payer: Self-pay

## 2021-09-06 ENCOUNTER — Other Ambulatory Visit (HOSPITAL_COMMUNITY): Payer: Self-pay

## 2021-09-07 ENCOUNTER — Encounter (HOSPITAL_BASED_OUTPATIENT_CLINIC_OR_DEPARTMENT_OTHER): Payer: Self-pay

## 2021-09-07 ENCOUNTER — Encounter: Payer: Self-pay | Admitting: Nurse Practitioner

## 2021-09-07 ENCOUNTER — Ambulatory Visit (HOSPITAL_BASED_OUTPATIENT_CLINIC_OR_DEPARTMENT_OTHER)
Admission: RE | Admit: 2021-09-07 | Discharge: 2021-09-07 | Disposition: A | Discharge status: Home / Self Care | Payer: Medicare PPO | Source: Ambulatory Visit | Attending: Oncology | Admitting: Oncology

## 2021-09-07 ENCOUNTER — Inpatient Hospital Stay: Payer: Medicare PPO | Attending: Internal Medicine

## 2021-09-07 ENCOUNTER — Other Ambulatory Visit (HOSPITAL_COMMUNITY): Payer: Self-pay

## 2021-09-07 ENCOUNTER — Inpatient Hospital Stay: Payer: Medicare PPO | Admitting: Nurse Practitioner

## 2021-09-07 VITALS — BP 107/79 | HR 105 | Temp 98.1°F | Resp 18

## 2021-09-07 DIAGNOSIS — C099 Malignant neoplasm of tonsil, unspecified: Secondary | ICD-10-CM | POA: Diagnosis present

## 2021-09-07 DIAGNOSIS — C090 Malignant neoplasm of tonsillar fossa: Secondary | ICD-10-CM | POA: Insufficient documentation

## 2021-09-07 DIAGNOSIS — R110 Nausea: Secondary | ICD-10-CM | POA: Insufficient documentation

## 2021-09-07 DIAGNOSIS — K769 Liver disease, unspecified: Secondary | ICD-10-CM | POA: Insufficient documentation

## 2021-09-07 DIAGNOSIS — C7951 Secondary malignant neoplasm of bone: Secondary | ICD-10-CM | POA: Insufficient documentation

## 2021-09-07 DIAGNOSIS — R911 Solitary pulmonary nodule: Secondary | ICD-10-CM | POA: Insufficient documentation

## 2021-09-07 DIAGNOSIS — M899 Disorder of bone, unspecified: Secondary | ICD-10-CM | POA: Insufficient documentation

## 2021-09-07 DIAGNOSIS — C7800 Secondary malignant neoplasm of unspecified lung: Secondary | ICD-10-CM | POA: Insufficient documentation

## 2021-09-07 DIAGNOSIS — J90 Pleural effusion, not elsewhere classified: Secondary | ICD-10-CM | POA: Insufficient documentation

## 2021-09-07 LAB — CBC WITH DIFFERENTIAL (CANCER CENTER ONLY)
Abs Immature Granulocytes: 0.05 10*3/uL (ref 0.00–0.07)
Basophils Absolute: 0 10*3/uL (ref 0.0–0.1)
Basophils Relative: 1 %
Eosinophils Absolute: 0.1 10*3/uL (ref 0.0–0.5)
Eosinophils Relative: 2 %
HCT: 41.7 % (ref 39.0–52.0)
Hemoglobin: 13.9 g/dL (ref 13.0–17.0)
Immature Granulocytes: 1 %
Lymphocytes Relative: 13 %
Lymphs Abs: 0.6 10*3/uL — ABNORMAL LOW (ref 0.7–4.0)
MCH: 34.2 pg — ABNORMAL HIGH (ref 26.0–34.0)
MCHC: 33.3 g/dL (ref 30.0–36.0)
MCV: 102.5 fL — ABNORMAL HIGH (ref 80.0–100.0)
Monocytes Absolute: 0.6 10*3/uL (ref 0.1–1.0)
Monocytes Relative: 15 %
Neutro Abs: 2.8 10*3/uL (ref 1.7–7.7)
Neutrophils Relative %: 68 %
Platelet Count: 353 10*3/uL (ref 150–400)
RBC: 4.07 MIL/uL — ABNORMAL LOW (ref 4.22–5.81)
RDW: 18.4 % — ABNORMAL HIGH (ref 11.5–15.5)
WBC Count: 4.2 10*3/uL (ref 4.0–10.5)
nRBC: 0 % (ref 0.0–0.2)

## 2021-09-07 LAB — CMP (CANCER CENTER ONLY)
ALT: <5 U/L (ref 0–44)
AST: 14 U/L — ABNORMAL LOW (ref 15–41)
Albumin: 3.6 g/dL (ref 3.5–5.0)
Alkaline Phosphatase: 67 U/L (ref 38–126)
Anion gap: 21 — ABNORMAL HIGH (ref 5–15)
BUN: 40 mg/dL — ABNORMAL HIGH (ref 8–23)
CO2: 23 mmol/L (ref 22–32)
Calcium: 10 mg/dL (ref 8.9–10.3)
Chloride: 99 mmol/L (ref 98–111)
Creatinine: 0.75 mg/dL (ref 0.61–1.24)
GFR, Estimated: >60 mL/min (ref >60)
Glucose, Bld: 128 mg/dL — ABNORMAL HIGH (ref 70–99)
Potassium: 3.1 mmol/L — ABNORMAL LOW (ref 3.5–5.1)
Sodium: 143 mmol/L (ref 135–145)
Total Bilirubin: 0.8 mg/dL (ref 0.3–1.2)
Total Protein: 5.9 g/dL — ABNORMAL LOW (ref 6.5–8.1)

## 2021-09-07 IMAGING — CT CT CHEST W/ CM
2 of 4 series · 14 of 36 positions shown, 17 images · IV contrast (APPLIED)
Comparison: CT chest, abdomen and pelvis dated [DATE]

CLINICAL DATA: Head and neck cancer, restaging; * Tracking Code: BO
*

EXAM:
CT CHEST WITH CONTRAST
TECHNIQUE: Multidetector CT imaging of the chest was performed during
intravenous contrast administration.

[Series 2: routine chest with (person_name) · axial · 0.65mm/px · z∈[+1029,+1295]mm · 11 of 159 slices shown, 14 images]
[im 13/159  mediastinal]
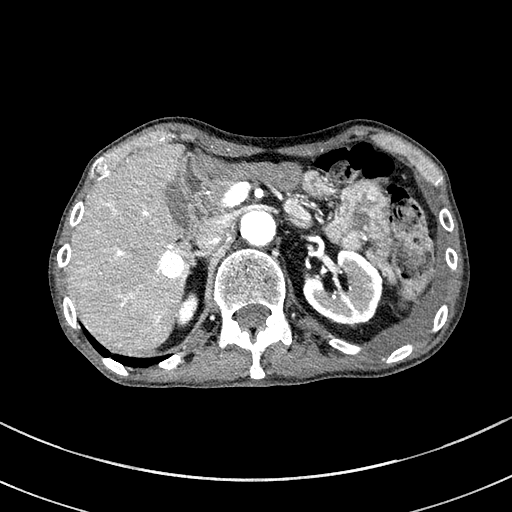
[im 13/159  lung]
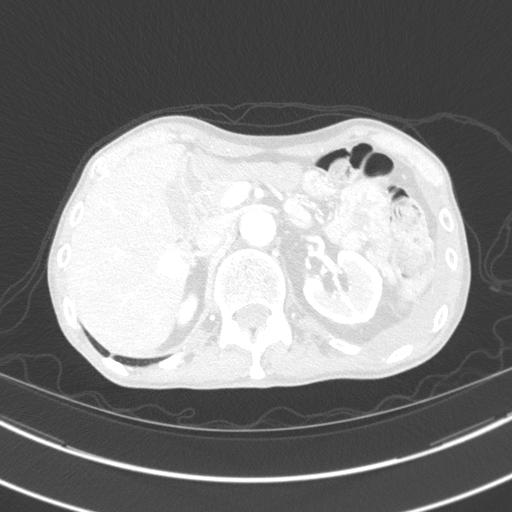
[im 25/159  lung]
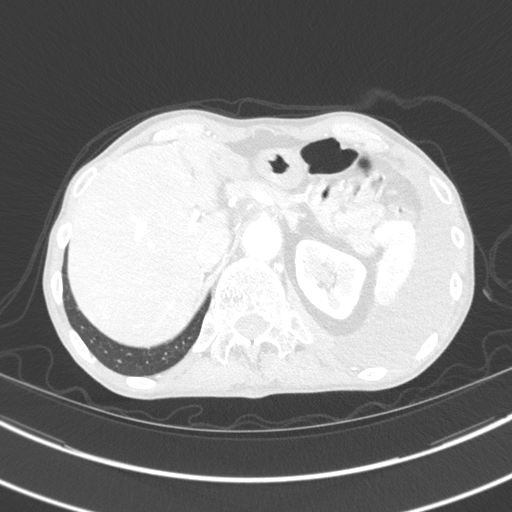
[im 37/159  lung]
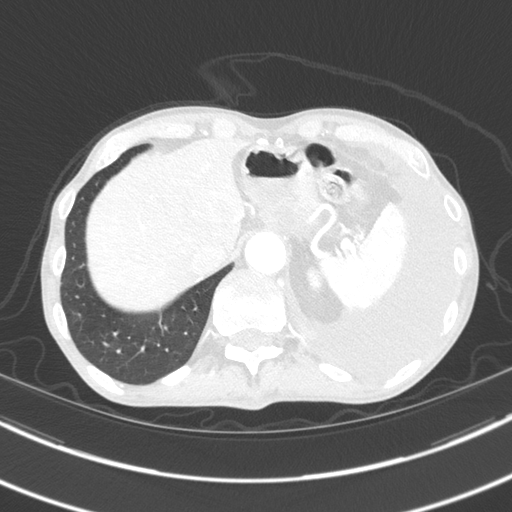
[im 49/159  lung]
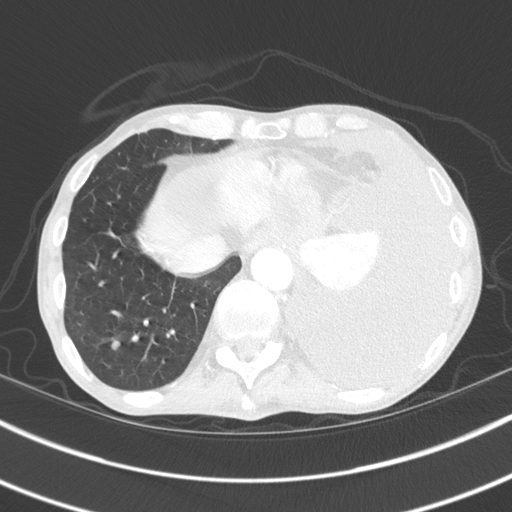
[im 61/159  mediastinal]
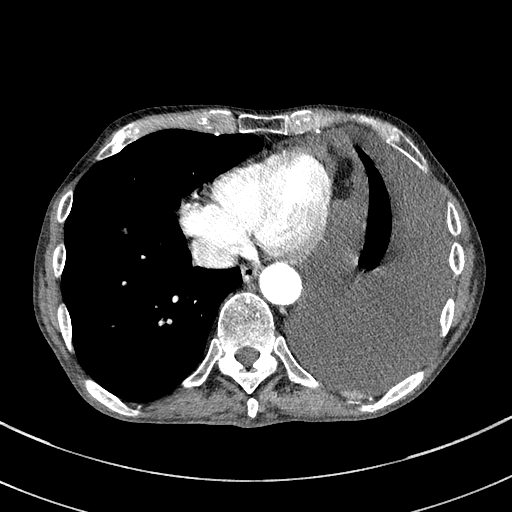
[im 61/159  lung]
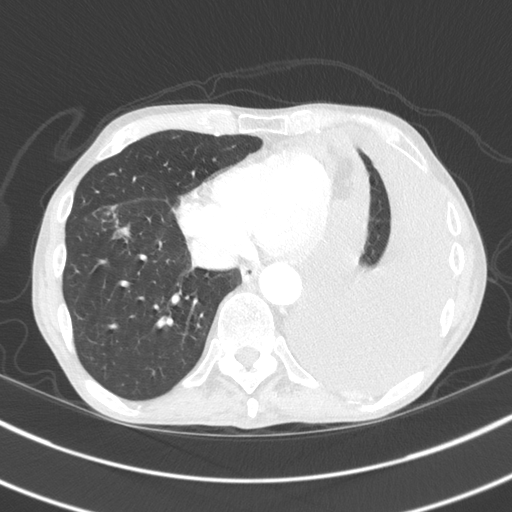
[im 86/159  lung]
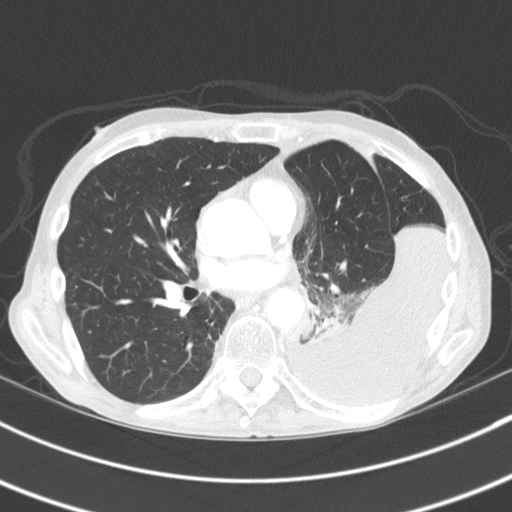
[im 98/159  lung]
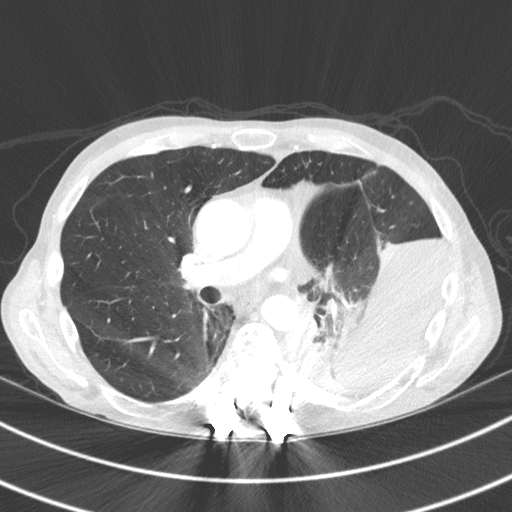
[im 110/159  lung]
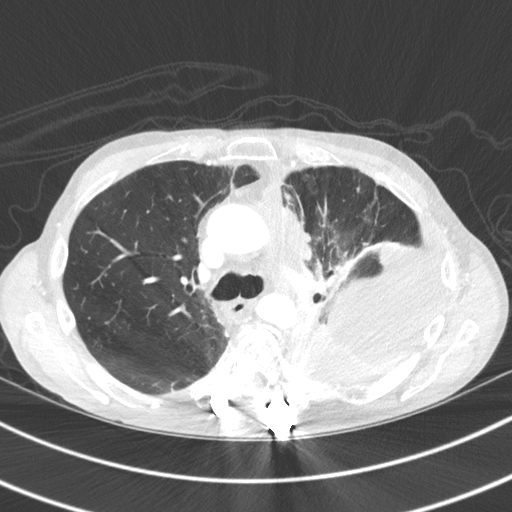
[im 122/159  mediastinal]
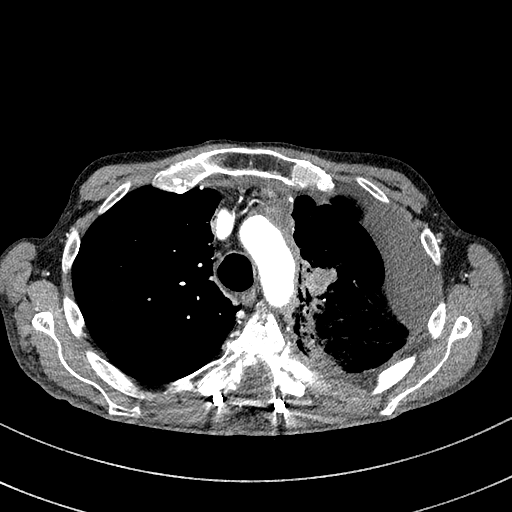
[im 122/159  lung]
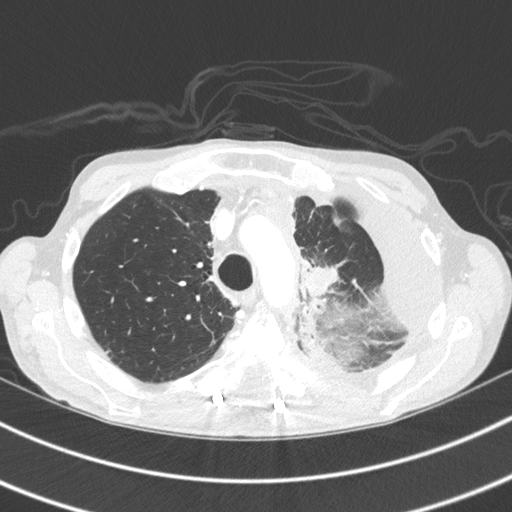
[im 134/159  lung]
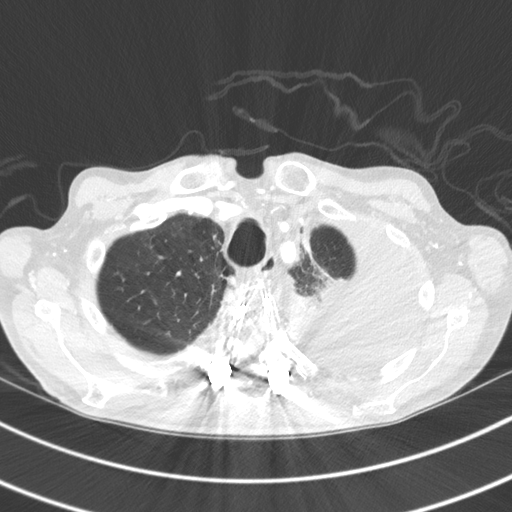
[im 146/159  lung]
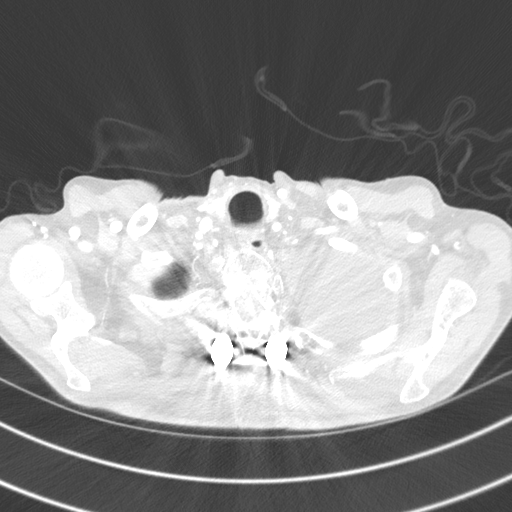

[Series 5: coronal (person_name) · coronal · 0.63mm/px · 3 of 114 slices shown]
[im 23/114  lung]
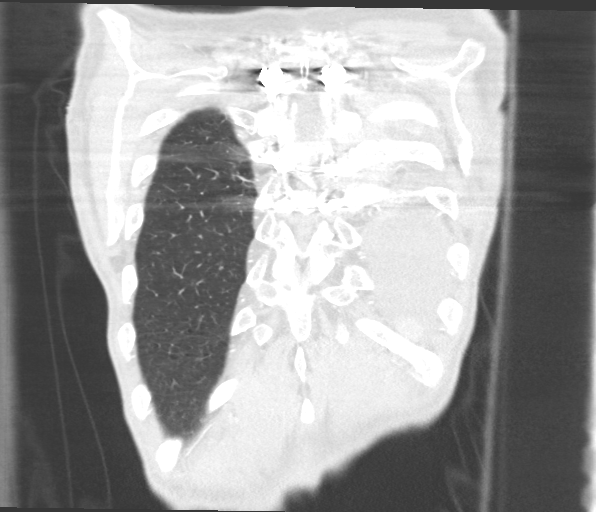
[im 46/114  lung]
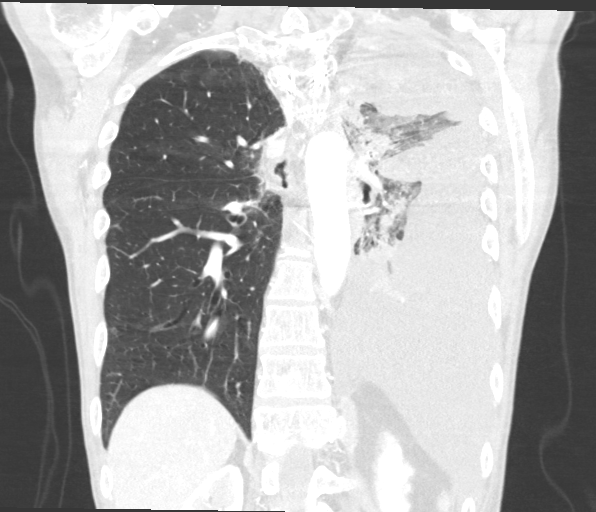
[im 68/114  lung]
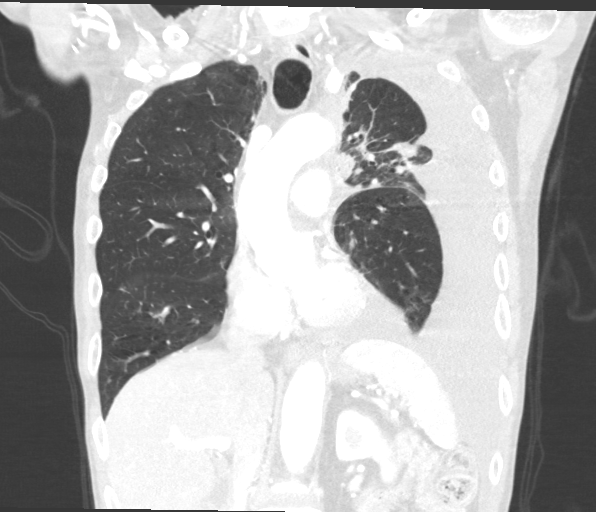

[14 of 36 positions shown; findings below may reference images not displayed]

RADIATION DOSE REDUCTION: This exam was performed according to the
departmental dose-optimization program which includes automated
exposure control, adjustment of the mA and/or kV according to
patient size and/or use of iterative reconstruction technique.

CONTRAST:  60mL OMNIPAQUE IOHEXOL 300 MG/ML  SOLN
FINDINGS: Cardiovascular: Normal heart size. No pericardial effusion. Mild
atherosclerotic disease of the thoracic aorta. No suspicious filling
defects of the central pulmonary arteries.

Mediastinum/Nodes: Esophagus and thyroid are unremarkable. Large
mass of the left pleura, mediastinum and thoracic paraspinal region
slightly decreased in size when compared with prior exam. For
example, nodular component of the anterior mediastinum measures 6 x
9 mm on series 2, image 48, previously 13 x 16 mm.

Lungs/Pleura: Central airways are patent. Increased
peribronchovascular thickening of the left lung and new left upper
lobe pulmonary nodule measuring 2.4 x 2.8 cm on series 40, image
159. Increased left pleural nodularity, for example, new pleural
nodule measuring 7 mm in thickness seen on series 2, image 142.
Decreased size of a right lower lobe solid pulmonary nodule
measuring 6 x 5 mm on series 2, image 53, previously 12 x 10 mm.
Other previously seen pulmonary nodules are stable. Moderate
loculated left pleural effusion, increased in size when compared
with prior exam.

Upper Abdomen: New ill-defined lesion of the left hepatic lobe seen
on series 2, image 119 measuring 2.5 x 2.2 cm. Stable enhancing
lesion of the right hepatic lobe, likely a portal vein varix or
hemangioma.

Musculoskeletal: Posterior fusion spanning T3-T7 with T5 pathologic
fracture. Increased sclerosis of the posterior [28] ribs and
[28] vertebral bodies. New sclerotic lesions of the anterior
fourth and lateral seventh left ribs. New right greater than left
bilateral anterior rib fractures.
IMPRESSION: 1. Large mass of the left pleura, mediastinum and thoracic
paraspinal region slightly decreased in size when compared with
prior exam.
2. Increased left pleural nodularity and increased size of moderate
left pleural effusion, concerning for worsening pleural metastatic
disease.
3. Increased peribronchovascular thickening with associated new
peribronchovascular left upper lobe pulmonary nodule measuring up to
2.8 cm, concerning for lymphangitic spread.
4. New ill-defined lesion of the left hepatic lobe, concerning for
hepatic metastatic disease.
5. Interval decreased size of a right lower lobe solid pulmonary
nodule. Other previously seen pulmonary nodules are stable.
6. Increased sclerosis of the posterior [28] ribs and [28]
vertebral bodies and new sclerotic lesions of the anterior fourth
and lateral seventh left ribs.
7. New right greater than left bilateral anterior rib fractures.
8. Aortic Atherosclerosis ([28]-[28]).

## 2021-09-07 MED ORDER — IOHEXOL 300 MG/ML  SOLN
100.0000 mL | Freq: Once | INTRAMUSCULAR | Status: AC | PRN
  Administered 2021-09-07: 60 mL via INTRAVENOUS

## 2021-09-07 NOTE — Progress Notes (Signed)
?[name] ?OFFICE PROGRESS NOTE ? ? ?Diagnosis: Head and neck cancer ? ?INTERVAL HISTORY:  ? ?Bradley Oliver returns as scheduled.  He completed cycle 4 Xeloda beginning 08/16/2021.  He had a chest CT earlier today.  He reports positional nausea/vomiting.  No mouth sores.  No diarrhea.  No hand or foot pain or redness.  He reports pain related to a "T5 fracture".  He states that he is weak.  Oral intake is poor.   ? ?Objective: ? ?Vital signs in last 24 hours: ? ?Blood pressure 107/79, pulse (!) 105, temperature 98.1 ?F (36.7 ?C), resp. rate 18, SpO2 96 %. ?  ? ?HEENT: Mouth appears dry. ?Resp: Lungs clear posteriorly. ?Cardio: Regular, mildly tachycardic. ?GI: No hepatomegaly. ?Vascular: No leg edema. ?Skin: Decreased skin turgor. ? ? ?Lab Results: ? ?Lab Results  ?Component Value Date  ? WBC 4.2 09/07/2021  ? HGB 13.9 09/07/2021  ? HCT 41.7 09/07/2021  ? MCV 102.5 (H) 09/07/2021  ? PLT 353 09/07/2021  ? NEUTROABS 2.8 09/07/2021  ? ? ?Imaging: ? ?No results found. ? ?Medications: I have reviewed the patient's current medications. ? 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% ?Diagnosed March 2016, stage IVa (T2N2bM0) HPV positive ?08/2014-09/2014-CRT 70 Gray in 35 fractions with cisplatin 20 mg per metered squared days 1-5, weeks 1 and 5 ?Recurrent disease April 2019-new left upper lobe cavitary lesion, infiltrative left hilar mass, prevascular and mediastinal adenopathy ?09/28/2017-flexible bronchoscopy with EBUS-biopsy station 7 node, squamous cell carcinoma p16 positive ?10/2017-12/2018-systemic therapy, carboplatin/docetaxel/pembrolizumab x5 followed by maintenance pembrolizumab, progression in 06/2018, 09/2018, and 12/2018 while on pembrolizumab-continue due to slow mild progression ?07/13/2018-disease progression with new left upper lobe posterior segment lesion, new left upper lobe nodule, new left apical nodularity with subsequent scans  10/05/2018 and 01/11/2019 confirming disease progression ?10/30/2020-PET add [facility] posterior mediastinal mass involving T5 and T6 and left posterior fifth and sixth ribs, hypermetabolic soft tissue within the prevascular mediastinum contiguous with the posterior mediastinal mass, hypermetabolic spiculated left upper lobe nodules, small left pleural effusion, focal small bowel uptake in left upper quadrant abutting the transverse colon ?Leg weakness and lower body numbness beginning 03/24/2021, fall prior to presenting to the emergency room 04/01/2021 ?MRI cervical, thoracic, and lumbar spine level 04/02/2021-no evidence of metastatic disease of the cervical spine, 8 mm enhancing lesion in the inferior aspect of L5-nonspecific, no other evidence of metastatic disease in the lumbar spine, large destructive and infiltrative metastatic deposit centered at the left paraspinous region involving T4-T7 with associated epidural extension with severe spinal stenosis, cord compression, cord signal changes at T4-5 through T6-7, metastatic lesion extends to involve the posterior medial aspect of the left lung and left mediastinum/hilar region, pathologic fracture of T5, 1 cm enhancing lesion in T11 indeterminate ?CTs 05/31/2021-no evidence of recurrent disease in the neck, decreased size of left pleural/mediastinal and thoracic paraspinal mass, minimally improved left pleural effusion, mild enlargement of a superior segment right lower lobe nodule, destructive findings of the left fifth and sixth ribs.  Callus formation associated with a healing fracture of the right anterior fourth rib. ?Cycle 1 Xeloda 06/14/2021 ?Cycle 2 Xeloda 07/05/2021 ?Cycle 3 Xeloda 07/26/2021 ?Cycle 4 Xeloda 08/16/2021 ?CT chest 09/07/2021-large mass of the left pleura, mediastinum and thoracic paraspinal region slightly decreased in size.  Increased left pleural nodularity and increased size of moderate left pleural effusion.  Increased  peribronchovascular thickening with associated new bronchovascular left upper lobe pulmonary nodule measuring up  to 2.8 cm.  New ill-defined lesion left hepatic lobe.  Interval decreased size of a right lower lobe solid pulmonary nodule.  New sclerotic lesions of the anterior fourth and lateral seventh left ribs.  New right greater than left bilateral anterior rib fractures ?  ?2.  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  ?  ? ?Disposition: [name] has completed 4 cycles of Xeloda.  Restaging chest CT from earlier today shows increased size of the left pleural effusion, a new liver lesion, new left upper lobe lung nodule and new rib lesions.  Mr. Neidert understands these findings most likely indicate progression of the cancer.  Dr. [name] discussed additional systemic therapy versus a supportive care approach with a hospice referral.  Mr. Pipkins requests this be discussed with Dr. Ready at [facility] prior to him making a decision. ? ?We discussed referral for a thoracentesis.  He declines this.  We also offered IV fluids as he appears dehydrated.  He declines this as well. ? ?He agrees to return for a follow-up visit next week.  We will contact Dr. Ready in the interim. ? ? ?Ned Card ANP/GNP-BC ? ?09/07/2021  ?1:18 PM ?This was a shared visit with Ned Card.  [name] was interviewed and examined.  We reviewed the CT findings and images with Bradley Oliver.  His performance status has declined.  There is clinical and radiologic evidence of disease progression. ? ?We recommended he be referred to the emergency room for evaluation of dehydration and his poor performance status.  I also recommended he be  referred for a therapeutic/diagnostic thoracentesis.  He declines a referral to the emergency room and a thoracentesis. ? ?He would like me to discuss the case with Dr. Georgeanna Lea.  I will contact Dr. Georgeanna Lea. ? ?He understands salvage systemic treatment options are limited. ? ?We discussed hospice care.  Mr. Ewings indicated he would like to be placed on a no CODE BLUE status.  He will agree to home hospice care if Dr. Georgeanna Lea is in agreement. ? ?I was present for greater than 50% of today's visit.  I performed medical decision making. ? ?Julieanne Manson, MD ? ? ? ? ? ? ?

## 2021-09-08 ENCOUNTER — Other Ambulatory Visit: Payer: Self-pay | Admitting: Nurse Practitioner

## 2021-09-08 DIAGNOSIS — F419 Anxiety disorder, unspecified: Secondary | ICD-10-CM

## 2021-09-08 MED ORDER — LORAZEPAM 0.5 MG PO TABS: 0.5000 mg | ORAL_TABLET | Freq: Every day | ORAL | 0 refills | Status: AC | PRN

## 2021-09-09 ENCOUNTER — Encounter: Payer: Self-pay | Admitting: Oncology

## 2021-09-10 ENCOUNTER — Telehealth: Payer: Self-pay | Admitting: *Deleted

## 2021-09-10 MED ORDER — ONDANSETRON 8 MG PO TBDP: 8.0000 mg | ORAL_TABLET | Freq: Three times a day (TID) | ORAL | 1 refills | Status: DC | PRN

## 2021-09-10 NOTE — Telephone Encounter (Signed)
Called Mr. Ham to discuss his GI discomfort. He describes it as a nervous/squeeze feeling. Has had this sensation for ~ 2 weeks. Is no longer on Xeloda. ?Per Dr. [name]: Can try Zofran ODT. Script sent and patient aware. ?Dr. [name] has sent email to Dr. Georgeanna Lea at [facility] w/no response thus far. Called [facility] triage nurse, Wilfred Curtis and requested she have Dr. Ready call or email Dr. [name] prior to his 4/24 visit at 3 pm.  ?

## 2021-09-13 ENCOUNTER — Inpatient Hospital Stay: Payer: Medicare PPO | Admitting: Oncology

## 2021-09-13 VITALS — BP 100/60 | HR 60 | Temp 98.7°F | Resp 18 | Ht 70.0 in | Wt 99.3 lb

## 2021-09-13 DIAGNOSIS — C090 Malignant neoplasm of tonsillar fossa: Secondary | ICD-10-CM | POA: Diagnosis not present

## 2021-09-13 DIAGNOSIS — R110 Nausea: Secondary | ICD-10-CM | POA: Diagnosis not present

## 2021-09-13 DIAGNOSIS — R911 Solitary pulmonary nodule: Secondary | ICD-10-CM | POA: Diagnosis not present

## 2021-09-13 DIAGNOSIS — M899 Disorder of bone, unspecified: Secondary | ICD-10-CM | POA: Diagnosis not present

## 2021-09-13 DIAGNOSIS — J90 Pleural effusion, not elsewhere classified: Secondary | ICD-10-CM | POA: Diagnosis not present

## 2021-09-13 DIAGNOSIS — C7951 Secondary malignant neoplasm of bone: Secondary | ICD-10-CM | POA: Diagnosis present

## 2021-09-13 DIAGNOSIS — C7800 Secondary malignant neoplasm of unspecified lung: Secondary | ICD-10-CM | POA: Diagnosis not present

## 2021-09-13 DIAGNOSIS — C099 Malignant neoplasm of tonsil, unspecified: Secondary | ICD-10-CM

## 2021-09-13 DIAGNOSIS — K769 Liver disease, unspecified: Secondary | ICD-10-CM | POA: Diagnosis not present

## 2021-09-13 NOTE — Progress Notes (Signed)
?[facility] ?OFFICE PROGRESS NOTE ? ? ?Diagnosis: Tonsil cancer ? ?INTERVAL HISTORY:  ? ?Bradley Oliver returns as scheduled.  He reports improvement in nausea when he takes lorazepam and ondansetron together.  His appetite has improved.  No dyspnea.  He continues physical therapy.  No new complaint. ? ?Objective: ? ?Vital signs in last 24 hours: ? ?Blood pressure 100/60, pulse 60, temperature 98.7 ?F (37.1 ?C), temperature source Oral, resp. rate 18, height 5' 10"  (1.778 m), SpO2 98 %. ?  ? ?HEENT: No thrush or ulcers ?Resp: Decreased breath sounds at the left compared to the right chest, no respiratory distress ?Cardio: Regular rate and rhythm ?GI: No hepatosplenomegaly ?Vascular: No leg edema ?Neuro: 4/5 strength throughout the legs bilaterally ?  ? ?Lab Results: ? ?Lab Results  ?Component Value Date  ? WBC 4.2 09/07/2021  ? HGB 13.9 09/07/2021  ? HCT 41.7 09/07/2021  ? MCV 102.5 (H) 09/07/2021  ? PLT 353 09/07/2021  ? NEUTROABS 2.8 09/07/2021  ? ? ?CMP  ?Lab Results  ?Component Value Date  ? NA 143 09/07/2021  ? K 3.1 (L) 09/07/2021  ? CL 99 09/07/2021  ? CO2 23 09/07/2021  ? GLUCOSE 128 (H) 09/07/2021  ? BUN 40 (H) 09/07/2021  ? CREATININE 0.75 09/07/2021  ? CALCIUM 10.0 09/07/2021  ? PROT 5.9 (L) 09/07/2021  ? ALBUMIN 3.6 09/07/2021  ? AST 14 (L) 09/07/2021  ? ALT <5 09/07/2021  ? ALKPHOS 67 09/07/2021  ? BILITOT 0.8 09/07/2021  ? GFRNONAA >60 09/07/2021  ? GFRAA >90 04/23/2021  ? ? ? ?Medications: I have reviewed the patient's current medications. ? ? 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% ?Diagnosed March 2016, stage IVa (T2N2bM0) HPV positive ?08/2014-09/2014-CRT 70 Gray in 35 fractions with cisplatin 20 mg per metered squared days 1-5, weeks 1 and 5 ?Recurrent disease April 2019-new left upper lobe cavitary lesion, infiltrative left hilar mass, prevascular and mediastinal adenopathy ?09/28/2017-flexible bronchoscopy with  EBUS-biopsy station 7 node, squamous cell carcinoma p16 positive ?10/2017-12/2018-systemic therapy, carboplatin/docetaxel/pembrolizumab x5 followed by maintenance pembrolizumab, progression in 06/2018, 09/2018, and 12/2018 while on pembrolizumab-continue due to slow mild progression ?07/13/2018-disease progression with new left upper lobe posterior segment lesion, new left upper lobe nodule, new left apical nodularity with subsequent scans 10/05/2018 and 01/11/2019 confirming disease progression ?10/30/2020-PET add [facility] posterior mediastinal mass involving T5 and T6 and left posterior fifth and sixth ribs, hypermetabolic soft tissue within the prevascular mediastinum contiguous with the posterior mediastinal mass, hypermetabolic spiculated left upper lobe nodules, small left pleural effusion, focal small bowel uptake in left upper quadrant abutting the transverse colon ?Leg weakness and lower body numbness beginning 03/24/2021, fall prior to presenting to the emergency room 04/01/2021 ?MRI cervical, thoracic, and lumbar spine level 04/02/2021-no evidence of metastatic disease of the cervical spine, 8 mm enhancing lesion in the inferior aspect of L5-nonspecific, no other evidence of metastatic disease in the lumbar spine, large destructive and infiltrative metastatic deposit centered at the left paraspinous region involving T4-T7 with associated epidural extension with severe spinal stenosis, cord compression, cord signal changes at T4-5 through T6-7, metastatic lesion extends to involve the posterior medial aspect of the left lung and left mediastinum/hilar region, pathologic fracture of T5, 1 cm enhancing lesion in T11 indeterminate ?CTs 05/31/2021-no evidence of recurrent disease in the neck, decreased size of left pleural/mediastinal and thoracic paraspinal mass, minimally improved left pleural effusion, mild enlargement of a superior segment right lower lobe nodule,  destructive findings of the left  fifth and sixth ribs.  Callus formation associated with a healing fracture of the right anterior fourth rib. ?Cycle 1 Xeloda 06/14/2021 ?Cycle 2 Xeloda 07/05/2021 ?Cycle 3 Xeloda 07/26/2021 ?Cycle 4 Xeloda 08/16/2021 ?CT chest 09/07/2021-large mass of the left pleura, mediastinum and thoracic paraspinal region slightly decreased in size.  Increased left pleural nodularity and increased size of moderate left pleural effusion.  Increased peribronchovascular thickening with associated new bronchovascular left upper lobe pulmonary nodule measuring up to 2.8 cm.  New ill-defined lesion left hepatic lobe.  Interval decreased size of a right lower lobe solid pulmonary nodule.  New sclerotic lesions of the anterior fourth and lateral seventh left ribs.  New right greater than left bilateral anterior rib fractures ?  ?2.  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  ?  ? ? 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  There is clinical and radiologic evidence of disease progression.  No therapy will be curative.  We discussed treatment options again today.  Bradley Oliver does not wish to undergo a palliative thoracentesis.  We discussed comfort/hospice care versus a trial of salvage systemic therapy.  I discussed the case with Dr. Ready by telephone last week.  We discussed multiple salvage systemic treatment options including single agent docetaxel, her last name, an EGFR inhibitor, and single agent carboplatin.  Bradley Oliver does not wish to receive further treatment with docetaxel or carboplatin.  He would prefer a trial of single agent cetuximab. ?We reviewed potential toxicities  associated with cetuximab including the chance of an anaphylactic reaction, rash, diarrhea, and electrolyte abnormalities.  He agrees to proceed with a trial of cetuximab given on a 2-week schedule. ? ?A treatment plan was entered today. ? ?Betsy Coder, MD ? ?09/13/2021  ?3:59 PM ? ? ?

## 2021-09-14 ENCOUNTER — Other Ambulatory Visit: Payer: Self-pay | Admitting: *Deleted

## 2021-09-14 DIAGNOSIS — C099 Malignant neoplasm of tonsil, unspecified: Secondary | ICD-10-CM

## 2021-09-19 ENCOUNTER — Other Ambulatory Visit: Payer: Self-pay | Admitting: Oncology

## 2021-09-20 ENCOUNTER — Encounter: Payer: Self-pay | Admitting: General Practice

## 2021-09-20 NOTE — Progress Notes (Signed)
[facility] Note ? ?Followed up with Mr Metoyer by phone for check-in about scan results. He appears matter-of-fact about his mortality, and yet is finding sources of meaning, as well, such as conversations with people who are important to him. 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 ? ?We plan to continue to have pastoral check-ins by phone for updates, meaning-making, and processing. Usually Mr Rosol enjoys reminiscing about his professional life and associated insights, though today was a shorter call. ? ? ?Chaplain Lorrin Jackson, MDiv, [facility] ?Pager [phone] ?Voicemail [phone]  ?

## 2021-09-22 ENCOUNTER — Inpatient Hospital Stay: Payer: Medicare PPO

## 2021-09-22 ENCOUNTER — Inpatient Hospital Stay: Payer: Medicare PPO | Attending: Internal Medicine

## 2021-09-22 VITALS — BP 129/80 | HR 79 | Temp 98.4°F | Resp 18

## 2021-09-22 DIAGNOSIS — C7951 Secondary malignant neoplasm of bone: Secondary | ICD-10-CM | POA: Insufficient documentation

## 2021-09-22 DIAGNOSIS — Z5112 Encounter for antineoplastic immunotherapy: Secondary | ICD-10-CM | POA: Insufficient documentation

## 2021-09-22 DIAGNOSIS — C099 Malignant neoplasm of tonsil, unspecified: Secondary | ICD-10-CM

## 2021-09-22 LAB — CMP (CANCER CENTER ONLY)
ALT: 7 U/L (ref 0–44)
AST: 19 U/L (ref 15–41)
Albumin: 3.1 g/dL — ABNORMAL LOW (ref 3.5–5.0)
Alkaline Phosphatase: 96 U/L (ref 38–126)
Anion gap: 9 (ref 5–15)
BUN: 17 mg/dL (ref 8–23)
CO2: 31 mmol/L (ref 22–32)
Calcium: 8.8 mg/dL — ABNORMAL LOW (ref 8.9–10.3)
Chloride: 98 mmol/L (ref 98–111)
Creatinine: 0.7 mg/dL (ref 0.61–1.24)
GFR, Estimated: >60 mL/min (ref >60)
Glucose, Bld: 99 mg/dL (ref 70–99)
Potassium: 3.2 mmol/L — ABNORMAL LOW (ref 3.5–5.1)
Sodium: 138 mmol/L (ref 135–145)
Total Bilirubin: 0.5 mg/dL (ref 0.3–1.2)
Total Protein: 5.2 g/dL — ABNORMAL LOW (ref 6.5–8.1)

## 2021-09-22 LAB — MAGNESIUM: Magnesium: 1.8 mg/dL (ref 1.7–2.4)

## 2021-09-22 MED ORDER — DIPHENHYDRAMINE HCL 50 MG/ML IJ SOLN
50.0000 mg | Freq: Once | INTRAMUSCULAR | Status: AC
  Administered 2021-09-22: 50 mg via INTRAVENOUS
  Filled 2021-09-22: qty 1

## 2021-09-22 MED ORDER — SODIUM CHLORIDE 0.9 % IV SOLN: Freq: Once | INTRAVENOUS | Status: AC

## 2021-09-22 MED ORDER — EMPTY CONTAINERS FLEXIBLE MISC
500.0000 mg/m2 | Freq: Once | Status: AC
  Administered 2021-09-22: 700 mg via INTRAVENOUS
  Filled 2021-09-22: qty 300

## 2021-09-22 MED ORDER — FAMOTIDINE IN NACL 20-0.9 MG/50ML-% IV SOLN
20.0000 mg | Freq: Once | INTRAVENOUS | Status: AC
  Administered 2021-09-22: 20 mg via INTRAVENOUS
  Filled 2021-09-22: qty 50

## 2021-09-22 NOTE — Progress Notes (Signed)
Patient presents for treatment. RN assessment completed along with the following: ? ?Labs/vitals reviewed - Yes, and within treatment parameters.   ?Weight within 10% of previous measurement - Yes ?Informed consent completed and reflects current therapy/intent - Yes, on date 09/22/2021             ?Provider progress note reviewed - Patient not seen by provider today. Most recent note dated 09/13/2021 reviewed. ?Treatment/Antibody/Supportive plan reviewed - Yes, and there are no adjustments needed for today's treatment. ?S&H and other orders reviewed - Yes, and there are no additional orders identified. ?Previous treatment date reviewed - Yes, and the appropriate amount of time has elapsed between treatments. ?Clinic Hand Off Received from - No ? ?Patient to proceed with treatment.  ? ?

## 2021-09-22 NOTE — Progress Notes (Signed)
Pharmacist Chemotherapy Monitoring - Initial Assessment   ? ?Anticipated start date: 09/22/21  ? ?The following has been reviewed per standard work regarding the patient's treatment regimen: ?The patient's diagnosis, treatment plan and drug doses, and organ/hematologic function ?Lab orders and baseline tests specific to treatment regimen  ?The treatment plan start date, drug sequencing, and pre-medications ?Prior authorization status  ?Patient's documented medication list, including drug-drug interaction screen and prescriptions for anti-emetics and supportive care specific to the treatment regimen ?The drug concentrations, fluid compatibility, administration routes, and timing of the medications to be used ?The patient's access for treatment and lifetime cumulative dose history, if applicable  ?The patient's medication allergies and previous infusion related reactions, if applicable  ? ?Changes made to treatment plan:  ?N/A ? ?Follow up needed:  ?N/A ? ? ?Bradley Oliver, [facility], ?09/22/2021  11:47 AM  ?

## 2021-09-22 NOTE — Patient Instructions (Signed)
[facility]   ?Discharge Instructions: ?Thank you for choosing [facility] to provide your oncology and hematology care.  ? ?If you have a lab appointment with the [facility], please go directly to the [facility] and check in at the registration area. ?  ?Wear comfortable clothing and clothing appropriate for easy access to any Portacath or PICC line.  ? ?We strive to give you quality time with your provider. You may need to reschedule your appointment if you arrive late (15 or more minutes).  Arriving late affects you and other patients whose appointments are after yours.  Also, if you miss three or more appointments without notifying the office, you may be dismissed from the clinic at the provider?s discretion.    ?  ?For prescription refill requests, have your pharmacy contact our office and allow 72 hours for refills to be completed.   ? ?Today you received the following chemotherapy and/or immunotherapy agents Cetuximab (ERBITUX).    ?  ?To help prevent nausea and vomiting after your treatment, we encourage you to take your nausea medication as directed. ? ?BELOW ARE SYMPTOMS THAT SHOULD BE REPORTED IMMEDIATELY: ?*FEVER GREATER THAN 100.4 F (38 ?C) OR HIGHER ?*CHILLS OR SWEATING ?*NAUSEA AND VOMITING THAT IS NOT CONTROLLED WITH YOUR NAUSEA MEDICATION ?*UNUSUAL SHORTNESS OF BREATH ?*UNUSUAL BRUISING OR BLEEDING ?*URINARY PROBLEMS (pain or burning when urinating, or frequent urination) ?*BOWEL PROBLEMS (unusual diarrhea, constipation, pain near the anus) ?TENDERNESS IN MOUTH AND THROAT WITH OR WITHOUT PRESENCE OF ULCERS (sore throat, sores in mouth, or a toothache) ?UNUSUAL RASH, SWELLING OR PAIN  ?UNUSUAL VAGINAL DISCHARGE OR ITCHING  ? ?Items with * indicate a potential emergency and should be followed up as soon as possible or go to the Emergency Department if any problems should occur. ? ?Please show the CHEMOTHERAPY ALERT CARD or IMMUNOTHERAPY ALERT CARD at  check-in to the Emergency Department and triage nurse. ? ?Should you have questions after your visit or need to cancel or reschedule your appointment, please contact [facility]  Dept: [phone]  and follow the prompts.  Office hours are 8:00 a.m. to 4:30 p.m. Monday - Friday. Please note that voicemails left after 4:00 p.m. may not be returned until the following business day.  We are closed weekends and major holidays. You have access to a nurse at all times for urgent questions. Please call the main number to the clinic Dept: [phone] and follow the prompts. ? ? ?For any non-urgent questions, you may also contact your provider using MyChart. We now offer e-Visits for anyone 17 and older to request care online for non-urgent symptoms. For details visit [email or web address]. ?  ?Also download the MyChart app! Go to the app store, search "MyChart", open the app, select [facility], and log in with your MyChart username and password. ? ?Due to Covid, a mask is required upon entering the hospital/clinic. If you do not have a mask, one will be given to you upon arrival. For doctor visits, patients may have 1 support person aged 71 or older with them. For treatment visits, patients cannot have anyone with them due to current Covid guidelines and our immunocompromised population.  ? ?Cetuximab injection ?What is this medication? ?CETUXIMAB (se TUX i mab) is a monoclonal antibody. It is used to treat colorectal cancer and head and neck cancer. ?This medicine may be used for other purposes; ask your health care provider or pharmacist if you have questions. ?COMMON  BRAND NAME(S): Erbitux ?What should I tell my care team before I take this medication? ?They need to know if you have any of these conditions: ?heart disease ?history of irregular heartbeat ?history of low levels of calcium, magnesium, or potassium in the blood ?history of tick bites ?lung or breathing disease, like  asthma ?red meat allergy ?an unusual or allergic reaction to cetuximab, other medicines, foods, dyes, or preservatives ?pregnant or trying to get pregnant ?breast-feeding ?How should I use this medication? ?This drug is given as an infusion into a vein. It is administered in a hospital or clinic by a specially trained health care professional. ?Talk to your pediatrician regarding the use of this medicine in children. Special care may be needed. ?Overdosage: If you think you have taken too much of this medicine contact a poison control center or emergency room at once. ?NOTE: This medicine is only for you. Do not share this medicine with others. ?What if I miss a dose? ?It is important not to miss your dose. Call your doctor or health care professional if you are unable to keep an appointment. ?What may interact with this medication? ?Interactions are not expected. ?This list may not describe all possible interactions. Give your health care provider a list of all the medicines, herbs, non-prescription drugs, or dietary supplements you use. Also tell them if you smoke, drink alcohol, or use illegal drugs. Some items may interact with your medicine. ?What should I watch for while using this medication? ?Visit your doctor or health care professional for regular checks on your progress. This drug may make you feel generally unwell. This is not uncommon, as chemotherapy can affect healthy cells as well as cancer cells. Report any side effects. Continue your course of treatment even though you feel ill unless your doctor tells you to stop. ?This medicine can make you more sensitive to the sun. Keep out of the sun while taking this medicine and for 2 months after the last dose. If you cannot avoid being in the sun, wear protective clothing and use sunscreen. Do not use sun lamps or tanning beds/booths. ?You may need blood work done while you are taking this medicine. ?In some cases, you may be given additional medicines to  help with side effects. Follow all directions for their use. ?Call your doctor or health care professional for advice if you get a fever, chills or sore throat, or other symptoms of a cold or flu. Do not treat yourself. This drug decreases your body's ability to fight infections. Try to avoid being around people who are sick. ?Avoid taking products that contain aspirin, acetaminophen, ibuprofen, naproxen, or ketoprofen unless instructed by your doctor. These medicines may hide a fever. ?Do not become pregnant while taking this medicine. Women should inform their doctor if they wish to become pregnant or think they might be pregnant. There is a potential for serious side effects to an unborn child. Use adequate birth control methods. Avoid pregnancy for at least 2 months after your last dose. Talk to your health care professional or pharmacist for more information. Do not breast-feed an infant while taking this medicine or during the 2 months after your last dose. ?What side effects may I notice from receiving this medication? ?Side effects that you should report to your doctor or health care professional as soon as possible: ?allergic reactions like skin rash, itching or hives, swelling of the face, lips, or tongue ?breathing problems ?changes in vision ?fast, irregular heartbeat ?feeling faint or  lightheaded, falls ?fever, chills ?mouth sores ?redness, blistering, peeling or loosening of the skin, including inside the mouth ?trouble passing urine or change in the amount of urine ?unusually weak or tired ?Side effects that usually do not require medical attention (report to your doctor or health care professional if they continue or are bothersome): ?changes in skin like acne, cracks, skin dryness ?constipation ?diarrhea ?headache ?nail changes ?nausea, vomiting ?stomach upset ?weight loss ?This list may not describe all possible side effects. Call your doctor for medical advice about side effects. You may report  side effects to FDA at 1-800-FDA-1088. ?Where should I keep my medication? ?This drug is given in a hospital or clinic and will not be stored at home. ?NOTE: This sheet is a summary. It may not cover all possible in

## 2021-09-23 ENCOUNTER — Other Ambulatory Visit: Payer: Self-pay

## 2021-09-23 ENCOUNTER — Telehealth: Payer: Self-pay

## 2021-09-23 MED ORDER — POTASSIUM CHLORIDE CRYS ER 20 MEQ PO TBCR: 20.0000 meq | EXTENDED_RELEASE_TABLET | Freq: Every day | ORAL | 0 refills | Status: DC

## 2021-09-23 NOTE — Telephone Encounter (Signed)
24 Hour Call Back ? ?Telephone call to patient post first time Cetuximab. Patient verbalized feeling very tired. He denied any nausea or vomiting. He denied any skin rash or skin redness. He reiterated that fatigue is all he is experiencing at the moment and has no other concerns. He did confirm that he was informed that this would be expected. Patient was encouraged to get as much rest as possible and stay hydrated. He knows to call the clinic with any questions or concerns that may arise. ?

## 2021-09-23 NOTE — Telephone Encounter (Signed)
Pt verbalized understanding. Prescription sent to the pharmacy. ?

## 2021-09-23 NOTE — Telephone Encounter (Signed)
-----   Message from Ladell Pier, MD sent at 09/22/2021  8:03 PM EDT ----- ?Please call patient, the potassium level is low, start potassium chloride 20 mEq daily, repeat CMP and magnesium level next visit ? ?

## 2021-09-27 ENCOUNTER — Encounter: Payer: Self-pay | Admitting: *Deleted

## 2021-09-27 NOTE — Progress Notes (Signed)
Notified by Bradley Oliver that patient missed his home PT visit on 5/3 and 5/5 due to fatigue. ?

## 2021-10-03 ENCOUNTER — Other Ambulatory Visit: Payer: Self-pay | Admitting: Oncology

## 2021-10-06 ENCOUNTER — Encounter: Payer: Self-pay | Admitting: *Deleted

## 2021-10-06 ENCOUNTER — Inpatient Hospital Stay: Payer: Medicare PPO | Admitting: Oncology

## 2021-10-06 ENCOUNTER — Inpatient Hospital Stay: Payer: Medicare PPO

## 2021-10-06 VITALS — BP 98/65 | HR 99 | Temp 98.2°F | Resp 18 | Ht 70.0 in | Wt 100.0 lb

## 2021-10-06 VITALS — BP 90/63 | HR 73

## 2021-10-06 DIAGNOSIS — C099 Malignant neoplasm of tonsil, unspecified: Secondary | ICD-10-CM

## 2021-10-06 DIAGNOSIS — Z5112 Encounter for antineoplastic immunotherapy: Secondary | ICD-10-CM | POA: Diagnosis not present

## 2021-10-06 LAB — CBC WITH DIFFERENTIAL (CANCER CENTER ONLY)
Abs Immature Granulocytes: 0.06 10*3/uL (ref 0.00–0.07)
Basophils Absolute: 0 10*3/uL (ref 0.0–0.1)
Basophils Relative: 0 %
Eosinophils Absolute: 0.2 10*3/uL (ref 0.0–0.5)
Eosinophils Relative: 2 %
HCT: 37.7 % — ABNORMAL LOW (ref 39.0–52.0)
Hemoglobin: 12.3 g/dL — ABNORMAL LOW (ref 13.0–17.0)
Immature Granulocytes: 1 %
Lymphocytes Relative: 5 %
Lymphs Abs: 0.4 10*3/uL — ABNORMAL LOW (ref 0.7–4.0)
MCH: 32.7 pg (ref 26.0–34.0)
MCHC: 32.6 g/dL (ref 30.0–36.0)
MCV: 100.3 fL — ABNORMAL HIGH (ref 80.0–100.0)
Monocytes Absolute: 0.6 10*3/uL (ref 0.1–1.0)
Monocytes Relative: 8 %
Neutro Abs: 6.8 10*3/uL (ref 1.7–7.7)
Neutrophils Relative %: 84 %
Platelet Count: 422 10*3/uL — ABNORMAL HIGH (ref 150–400)
RBC: 3.76 MIL/uL — ABNORMAL LOW (ref 4.22–5.81)
RDW: 16.2 % — ABNORMAL HIGH (ref 11.5–15.5)
WBC Count: 8.1 10*3/uL (ref 4.0–10.5)
nRBC: 0 % (ref 0.0–0.2)

## 2021-10-06 LAB — CMP (CANCER CENTER ONLY)
ALT: 10 U/L (ref 0–44)
AST: 20 U/L (ref 15–41)
Albumin: 2.9 g/dL — ABNORMAL LOW (ref 3.5–5.0)
Alkaline Phosphatase: 111 U/L (ref 38–126)
Anion gap: 13 (ref 5–15)
BUN: 22 mg/dL (ref 8–23)
CO2: 23 mmol/L (ref 22–32)
Calcium: 8.5 mg/dL — ABNORMAL LOW (ref 8.9–10.3)
Chloride: 99 mmol/L (ref 98–111)
Creatinine: 0.56 mg/dL — ABNORMAL LOW (ref 0.61–1.24)
GFR, Estimated: >60 mL/min (ref >60)
Glucose, Bld: 121 mg/dL — ABNORMAL HIGH (ref 70–99)
Potassium: 3.9 mmol/L (ref 3.5–5.1)
Sodium: 135 mmol/L (ref 135–145)
Total Bilirubin: 0.6 mg/dL (ref 0.3–1.2)
Total Protein: 5.5 g/dL — ABNORMAL LOW (ref 6.5–8.1)

## 2021-10-06 LAB — MAGNESIUM: Magnesium: 1.6 mg/dL — ABNORMAL LOW (ref 1.7–2.4)

## 2021-10-06 MED ORDER — SODIUM CHLORIDE 0.9 % IV SOLN: Freq: Once | INTRAVENOUS | Status: AC

## 2021-10-06 MED ORDER — DIPHENHYDRAMINE HCL 50 MG/ML IJ SOLN
50.0000 mg | Freq: Once | INTRAMUSCULAR | Status: AC
  Administered 2021-10-06: 50 mg via INTRAVENOUS
  Filled 2021-10-06: qty 1

## 2021-10-06 MED ORDER — FAMOTIDINE IN NACL 20-0.9 MG/50ML-% IV SOLN
20.0000 mg | Freq: Once | INTRAVENOUS | Status: AC
  Administered 2021-10-06: 20 mg via INTRAVENOUS
  Filled 2021-10-06: qty 50

## 2021-10-06 MED ORDER — EMPTY CONTAINERS FLEXIBLE MISC
500.0000 mg/m2 | Freq: Once | Status: AC
  Administered 2021-10-06: 700 mg via INTRAVENOUS
  Filled 2021-10-06: qty 50

## 2021-10-06 NOTE — Progress Notes (Signed)
?[facility] ?OFFICE PROGRESS NOTE ? ? ?Diagnosis: Head neck cancer ? ?INTERVAL HISTORY:  ? ?Mr. Scheff completed a first cycle of cetuximab on 09/22/2021.  No rash, diarrhea, or symptom of allergic reaction.  Reports increased malaise for several days following the cetuximab.  He continues home physical therapy.  He reports his fluid intake has increased.  Dyspnea has improved.  No new complaint.  He reports pain and "nerve sensations "improved with CBD Gummies. ? ?Objective: ? ?Vital signs in last 24 hours: ? ?Blood pressure 98/65, pulse 99, temperature 98.2 ?F (36.8 ?C), temperature source Oral, resp. rate 18, height _0  (1.778 m), weight 100 lb (45.4 kg), SpO2 96 %. ?  ? ?HEENT: No thrush or ulcers ?Resp: Bronchial sounds at the left posterior chest, good air movement bilaterally, no respiratory distress ?Cardio: Regular rate and rhythm ?GI: No hepatosplenomegaly, nontender ?Vascular: No leg edema ?Neuro: Alert, he moves all extremities ?Skin: Dryness over the legs, healed upper back incision ? ? ?Lab Results: ? ?Lab Results  ?Component Value Date  ? WBC 8.1 10/06/2021  ? HGB 12.3 (L) 10/06/2021  ? HCT 37.7 (L) 10/06/2021  ? MCV 100.3 (H) 10/06/2021  ? PLT 422 (H) 10/06/2021  ? NEUTROABS 6.8 10/06/2021  ? ? ?CMP  ?Lab Results  ?Component Value Date  ? NA 138 09/22/2021  ? K 3.2 (L) 09/22/2021  ? CL 98 09/22/2021  ? CO2 31 09/22/2021  ? GLUCOSE 99 09/22/2021  ? BUN 17 09/22/2021  ? CREATININE 0.70 09/22/2021  ? CALCIUM 8.8 (L) 09/22/2021  ? PROT 5.2 (L) 09/22/2021  ? ALBUMIN 3.1 (L) 09/22/2021  ? AST 19 09/22/2021  ? ALT 7 09/22/2021  ? ALKPHOS 96 09/22/2021  ? BILITOT 0.5 09/22/2021  ? GFRNONAA >60 09/22/2021  ? GFRAA >90 04/23/2021  ? ? ? ?Medications: I have reviewed the patient's current medications. ? ? 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% ?Diagnosed March 2016, stage IVa (T2N2bM0) HPV positive ?08/2014-09/2014-CRT 70 Gray  in 35 fractions with cisplatin 20 mg per metered squared days 1-5, weeks 1 and 5 ?Recurrent disease April 2019-new left upper lobe cavitary lesion, infiltrative left hilar mass, prevascular and mediastinal adenopathy ?09/28/2017-flexible bronchoscopy with EBUS-biopsy station 7 node, squamous cell carcinoma p16 positive ?10/2017-12/2018-systemic therapy, carboplatin/docetaxel/pembrolizumab x5 followed by maintenance pembrolizumab, progression in 06/2018, 09/2018, and 12/2018 while on pembrolizumab-continue due to slow mild progression ?07/13/2018-disease progression with new left upper lobe posterior segment lesion, new left upper lobe nodule, new left apical nodularity with subsequent scans 10/05/2018 and 01/11/2019 confirming disease progression ?10/30/2020-PET add [facility] posterior mediastinal mass involving T5 and T6 and left posterior fifth and sixth ribs, hypermetabolic soft tissue within the prevascular mediastinum contiguous with the posterior mediastinal mass, hypermetabolic spiculated left upper lobe nodules, small left pleural effusion, focal small bowel uptake in left upper quadrant abutting the transverse colon ?Leg weakness and lower body numbness beginning 03/24/2021, fall prior to presenting to the emergency room 04/01/2021 ?MRI cervical, thoracic, and lumbar spine level 04/02/2021-no evidence of metastatic disease of the cervical spine, 8 mm enhancing lesion in the inferior aspect of L5-nonspecific, no other evidence of metastatic disease in the lumbar spine, large destructive and infiltrative metastatic deposit centered at the left paraspinous region involving T4-T7 with associated epidural extension with severe spinal stenosis, cord compression, cord signal changes at T4-5 through T6-7, metastatic lesion extends to involve the posterior medial aspect of the left lung and left mediastinum/hilar region, pathologic  fracture of T5, 1 cm enhancing lesion in T11 indeterminate ?CTs 05/31/2021-no  evidence of recurrent disease in the neck, decreased size of left pleural/mediastinal and thoracic paraspinal mass, minimally improved left pleural effusion, mild enlargement of a superior segment right lower lobe nodule, destructive findings of the left fifth and sixth ribs.  Callus formation associated with a healing fracture of the right anterior fourth rib. ?Cycle 1 Xeloda 06/14/2021 ?Cycle 2 Xeloda 07/05/2021 ?Cycle 3 Xeloda 07/26/2021 ?Cycle 4 Xeloda 08/16/2021 ?CT chest 09/07/2021-large mass of the left pleura, mediastinum and thoracic paraspinal region slightly decreased in size.  Increased left pleural nodularity and increased size of moderate left pleural effusion.  Increased peribronchovascular thickening with associated new bronchovascular left upper lobe pulmonary nodule measuring up to 2.8 cm.  New ill-defined lesion left hepatic lobe.  Interval decreased size of a right lower lobe solid pulmonary nodule.  New sclerotic lesions of the anterior fourth and lateral seventh left ribs.  New right greater than left bilateral anterior rib fractures ?Cycle 1 cetuximab 09/22/2021 ?Cycle 2 cetuximab 10/06/2021 ?  ?2.  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  ?  ? ? ? ?Disposition: ?Mr. Mullarkey already the first cycle of cetuximab without significant acute toxicity.  He will complete cycle 2 today.  His overall status appears unchanged.  I encouraged him to increase his oral intake and continue physical therapy.  He will return for an office visit and cetuximab in 2 weeks. ? ?Betsy Coder, MD ? ?10/06/2021  ?10:27 AM ? ? ?

## 2021-10-06 NOTE — Progress Notes (Signed)
Patient seen by Dr. [name] today ? ?Vitals are within treatment parameters. ? ?Labs reviewed by Dr. [name] and are within treatment parameters. ?Mg+ 1.6 --no tx necessary for this ? ?Per physician team, patient is ready for treatment and there are NO modifications to the treatment plan.  ?

## 2021-10-06 NOTE — Patient Instructions (Signed)
[facility]   ?Discharge Instructions: ?Thank you for choosing [facility] to provide your oncology and hematology care.  ? ?If you have a lab appointment with the [facility], please go directly to the [facility] and check in at the registration area. ?  ?Wear comfortable clothing and clothing appropriate for easy access to any Portacath or PICC line.  ? ?We strive to give you quality time with your provider. You may need to reschedule your appointment if you arrive late (15 or more minutes).  Arriving late affects you and other patients whose appointments are after yours.  Also, if you miss three or more appointments without notifying the office, you may be dismissed from the clinic at the provider?s discretion.    ?  ?For prescription refill requests, have your pharmacy contact our office and allow 72 hours for refills to be completed.   ? ?Today you received the following chemotherapy and/or immunotherapy agents Cetuximab.    ?  ?To help prevent nausea and vomiting after your treatment, we encourage you to take your nausea medication as directed. ? ?BELOW ARE SYMPTOMS THAT SHOULD BE REPORTED IMMEDIATELY: ?*FEVER GREATER THAN 100.4 F (38 ?C) OR HIGHER ?*CHILLS OR SWEATING ?*NAUSEA AND VOMITING THAT IS NOT CONTROLLED WITH YOUR NAUSEA MEDICATION ?*UNUSUAL SHORTNESS OF BREATH ?*UNUSUAL BRUISING OR BLEEDING ?*URINARY PROBLEMS (pain or burning when urinating, or frequent urination) ?*BOWEL PROBLEMS (unusual diarrhea, constipation, pain near the anus) ?TENDERNESS IN MOUTH AND THROAT WITH OR WITHOUT PRESENCE OF ULCERS (sore throat, sores in mouth, or a toothache) ?UNUSUAL RASH, SWELLING OR PAIN  ?UNUSUAL VAGINAL DISCHARGE OR ITCHING  ? ?Items with * indicate a potential emergency and should be followed up as soon as possible or go to the Emergency Department if any problems should occur. ? ?Please show the CHEMOTHERAPY ALERT CARD or IMMUNOTHERAPY ALERT CARD at check-in to  the Emergency Department and triage nurse. ? ?Should you have questions after your visit or need to cancel or reschedule your appointment, please contact [facility]  Dept: [phone]  and follow the prompts.  Office hours are 8:00 a.m. to 4:30 p.m. Monday - Friday. Please note that voicemails left after 4:00 p.m. may not be returned until the following business day.  We are closed weekends and major holidays. You have access to a nurse at all times for urgent questions. Please call the main number to the clinic Dept: [phone] and follow the prompts. ? ? ?For any non-urgent questions, you may also contact your provider using MyChart. We now offer e-Visits for anyone 29 and older to request care online for non-urgent symptoms. For details visit [email or web address]. ?  ?Also download the MyChart app! Go to the app store, search "MyChart", open the app, select [facility], and log in with your MyChart username and password. ? ?Due to Covid, a mask is required upon entering the hospital/clinic. If you do not have a mask, one will be given to you upon arrival. For doctor visits, patients may have 1 support person aged 54 or older with them. For treatment visits, patients cannot have anyone with them due to current Covid guidelines and our immunocompromised population.  ? ?Cetuximab injection ?What is this medication? ?CETUXIMAB (se TUX i mab) is a monoclonal antibody. It is used to treat colorectal cancer and head and neck cancer. ?This medicine may be used for other purposes; ask your health care provider or pharmacist if you have questions. ?COMMON BRAND  NAME(S): Erbitux ?What should I tell my care team before I take this medication? ?They need to know if you have any of these conditions: ?heart disease ?history of irregular heartbeat ?history of low levels of calcium, magnesium, or potassium in the blood ?history of tick bites ?lung or breathing disease, like asthma ?red meat  allergy ?an unusual or allergic reaction to cetuximab, other medicines, foods, dyes, or preservatives ?pregnant or trying to get pregnant ?breast-feeding ?How should I use this medication? ?This drug is given as an infusion into a vein. It is administered in a hospital or clinic by a specially trained health care professional. ?Talk to your pediatrician regarding the use of this medicine in children. Special care may be needed. ?Overdosage: If you think you have taken too much of this medicine contact a poison control center or emergency room at once. ?NOTE: This medicine is only for you. Do not share this medicine with others. ?What if I miss a dose? ?It is important not to miss your dose. Call your doctor or health care professional if you are unable to keep an appointment. ?What may interact with this medication? ?Interactions are not expected. ?This list may not describe all possible interactions. Give your health care provider a list of all the medicines, herbs, non-prescription drugs, or dietary supplements you use. Also tell them if you smoke, drink alcohol, or use illegal drugs. Some items may interact with your medicine. ?What should I watch for while using this medication? ?Visit your doctor or health care professional for regular checks on your progress. This drug may make you feel generally unwell. This is not uncommon, as chemotherapy can affect healthy cells as well as cancer cells. Report any side effects. Continue your course of treatment even though you feel ill unless your doctor tells you to stop. ?This medicine can make you more sensitive to the sun. Keep out of the sun while taking this medicine and for 2 months after the last dose. If you cannot avoid being in the sun, wear protective clothing and use sunscreen. Do not use sun lamps or tanning beds/booths. ?You may need blood work done while you are taking this medicine. ?In some cases, you may be given additional medicines to help with side  effects. Follow all directions for their use. ?Call your doctor or health care professional for advice if you get a fever, chills or sore throat, or other symptoms of a cold or flu. Do not treat yourself. This drug decreases your body's ability to fight infections. Try to avoid being around people who are sick. ?Avoid taking products that contain aspirin, acetaminophen, ibuprofen, naproxen, or ketoprofen unless instructed by your doctor. These medicines may hide a fever. ?Do not become pregnant while taking this medicine. Women should inform their doctor if they wish to become pregnant or think they might be pregnant. There is a potential for serious side effects to an unborn child. Use adequate birth control methods. Avoid pregnancy for at least 2 months after your last dose. Talk to your health care professional or pharmacist for more information. Do not breast-feed an infant while taking this medicine or during the 2 months after your last dose. ?What side effects may I notice from receiving this medication? ?Side effects that you should report to your doctor or health care professional as soon as possible: ?allergic reactions like skin rash, itching or hives, swelling of the face, lips, or tongue ?breathing problems ?changes in vision ?fast, irregular heartbeat ?feeling faint or lightheaded,  falls ?fever, chills ?mouth sores ?redness, blistering, peeling or loosening of the skin, including inside the mouth ?trouble passing urine or change in the amount of urine ?unusually weak or tired ?Side effects that usually do not require medical attention (report to your doctor or health care professional if they continue or are bothersome): ?changes in skin like acne, cracks, skin dryness ?constipation ?diarrhea ?headache ?nail changes ?nausea, vomiting ?stomach upset ?weight loss ?This list may not describe all possible side effects. Call your doctor for medical advice about side effects. You may report side effects to  FDA at 1-800-FDA-1088. ?Where should I keep my medication? ?This drug is given in a hospital or clinic and will not be stored at home. ?NOTE: This sheet is a summary. It may not cover all possible information.

## 2021-10-18 ENCOUNTER — Other Ambulatory Visit: Payer: Self-pay | Admitting: Oncology

## 2021-10-20 ENCOUNTER — Encounter: Payer: Self-pay | Admitting: Nurse Practitioner

## 2021-10-20 ENCOUNTER — Encounter: Payer: Self-pay | Admitting: *Deleted

## 2021-10-20 ENCOUNTER — Inpatient Hospital Stay: Payer: Medicare PPO

## 2021-10-20 ENCOUNTER — Inpatient Hospital Stay: Payer: Medicare PPO | Admitting: Nurse Practitioner

## 2021-10-20 VITALS — BP 98/73 | HR 76 | Temp 98.1°F | Resp 20

## 2021-10-20 VITALS — BP 98/77 | HR 98 | Temp 97.7°F | Resp 18 | Ht 70.0 in | Wt 100.0 lb

## 2021-10-20 DIAGNOSIS — C099 Malignant neoplasm of tonsil, unspecified: Secondary | ICD-10-CM | POA: Diagnosis not present

## 2021-10-20 DIAGNOSIS — Z5112 Encounter for antineoplastic immunotherapy: Secondary | ICD-10-CM | POA: Diagnosis not present

## 2021-10-20 LAB — CMP (CANCER CENTER ONLY)
ALT: 10 U/L (ref 0–44)
AST: 24 U/L (ref 15–41)
Albumin: 3.1 g/dL — ABNORMAL LOW (ref 3.5–5.0)
Alkaline Phosphatase: 89 U/L (ref 38–126)
Anion gap: 11 (ref 5–15)
BUN: 17 mg/dL (ref 8–23)
CO2: 23 mmol/L (ref 22–32)
Calcium: 8.9 mg/dL (ref 8.9–10.3)
Chloride: 101 mmol/L (ref 98–111)
Creatinine: 0.58 mg/dL — ABNORMAL LOW (ref 0.61–1.24)
GFR, Estimated: >60 mL/min (ref >60)
Glucose, Bld: 130 mg/dL — ABNORMAL HIGH (ref 70–99)
Potassium: 3.7 mmol/L (ref 3.5–5.1)
Sodium: 135 mmol/L (ref 135–145)
Total Bilirubin: 0.5 mg/dL (ref 0.3–1.2)
Total Protein: 5.7 g/dL — ABNORMAL LOW (ref 6.5–8.1)

## 2021-10-20 LAB — CBC WITH DIFFERENTIAL (CANCER CENTER ONLY)
Abs Immature Granulocytes: 0.04 10*3/uL (ref 0.00–0.07)
Basophils Absolute: 0.1 10*3/uL (ref 0.0–0.1)
Basophils Relative: 1 %
Eosinophils Absolute: 0.2 10*3/uL (ref 0.0–0.5)
Eosinophils Relative: 3 %
HCT: 38.7 % — ABNORMAL LOW (ref 39.0–52.0)
Hemoglobin: 12.6 g/dL — ABNORMAL LOW (ref 13.0–17.0)
Immature Granulocytes: 1 %
Lymphocytes Relative: 9 %
Lymphs Abs: 0.5 10*3/uL — ABNORMAL LOW (ref 0.7–4.0)
MCH: 31.9 pg (ref 26.0–34.0)
MCHC: 32.6 g/dL (ref 30.0–36.0)
MCV: 98 fL (ref 80.0–100.0)
Monocytes Absolute: 0.6 10*3/uL (ref 0.1–1.0)
Monocytes Relative: 9 %
Neutro Abs: 4.7 10*3/uL (ref 1.7–7.7)
Neutrophils Relative %: 77 %
Platelet Count: 358 10*3/uL (ref 150–400)
RBC: 3.95 MIL/uL — ABNORMAL LOW (ref 4.22–5.81)
RDW: 16.3 % — ABNORMAL HIGH (ref 11.5–15.5)
WBC Count: 6 10*3/uL (ref 4.0–10.5)
nRBC: 0 % (ref 0.0–0.2)

## 2021-10-20 LAB — MAGNESIUM: Magnesium: 1.7 mg/dL (ref 1.7–2.4)

## 2021-10-20 MED ORDER — SODIUM CHLORIDE 0.9 % IV SOLN: Freq: Once | INTRAVENOUS | Status: AC

## 2021-10-20 MED ORDER — EMPTY CONTAINERS FLEXIBLE MISC
500.0000 mg/m2 | Freq: Once | Status: AC
  Administered 2021-10-20: 700 mg via INTRAVENOUS
  Filled 2021-10-20: qty 300

## 2021-10-20 MED ORDER — DIPHENHYDRAMINE HCL 50 MG/ML IJ SOLN
50.0000 mg | Freq: Once | INTRAMUSCULAR | Status: AC
  Administered 2021-10-20: 50 mg via INTRAVENOUS
  Filled 2021-10-20: qty 1

## 2021-10-20 MED ORDER — FAMOTIDINE IN NACL 20-0.9 MG/50ML-% IV SOLN
20.0000 mg | Freq: Once | INTRAVENOUS | Status: AC
  Administered 2021-10-20: 20 mg via INTRAVENOUS
  Filled 2021-10-20: qty 50

## 2021-10-20 NOTE — Patient Instructions (Signed)
[facility]   Discharge Instructions: Thank you for choosing [facility] to provide your oncology and hematology care.   If you have a lab appointment with the [facility], please go directly to the [facility] and check in at the registration area.   Wear comfortable clothing and clothing appropriate for easy access to any Portacath or PICC line.   We strive to give you quality time with your provider. You may need to reschedule your appointment if you arrive late (15 or more minutes).  Arriving late affects you and other patients whose appointments are after yours.  Also, if you miss three or more appointments without notifying the office, you may be dismissed from the clinic at the provider's discretion.      For prescription refill requests, have your pharmacy contact our office and allow 72 hours for refills to be completed.    Today you received the following chemotherapy and/or immunotherapy agents Cetuximab (ERBITUX).      To help prevent nausea and vomiting after your treatment, we encourage you to take your nausea medication as directed.  BELOW ARE SYMPTOMS THAT SHOULD BE REPORTED IMMEDIATELY: *FEVER GREATER THAN 100.4 F (38 C) OR HIGHER *CHILLS OR SWEATING *NAUSEA AND VOMITING THAT IS NOT CONTROLLED WITH YOUR NAUSEA MEDICATION *UNUSUAL SHORTNESS OF BREATH *UNUSUAL BRUISING OR BLEEDING *URINARY PROBLEMS (pain or burning when urinating, or frequent urination) *BOWEL PROBLEMS (unusual diarrhea, constipation, pain near the anus) TENDERNESS IN MOUTH AND THROAT WITH OR WITHOUT PRESENCE OF ULCERS (sore throat, sores in mouth, or a toothache) UNUSUAL RASH, SWELLING OR PAIN  UNUSUAL VAGINAL DISCHARGE OR ITCHING   Items with * indicate a potential emergency and should be followed up as soon as possible or go to the Emergency Department if any problems should occur.  Please show the CHEMOTHERAPY ALERT CARD or IMMUNOTHERAPY ALERT CARD at  check-in to the Emergency Department and triage nurse.  Should you have questions after your visit or need to cancel or reschedule your appointment, please contact [facility]  Dept: [phone]  and follow the prompts.  Office hours are 8:00 a.m. to 4:30 p.m. Monday - Friday. Please note that voicemails left after 4:00 p.m. may not be returned until the following business day.  We are closed weekends and major holidays. You have access to a nurse at all times for urgent questions. Please call the main number to the clinic Dept: [phone] and follow the prompts.   For any non-urgent questions, you may also contact your provider using MyChart. We now offer e-Visits for anyone 46 and older to request care online for non-urgent symptoms. For details visit [email or web address].   Also download the MyChart app! Go to the app store, search "MyChart", open the app, select [facility], and log in with your MyChart username and password.  Due to Covid, a mask is required upon entering the hospital/clinic. If you do not have a mask, one will be given to you upon arrival. For doctor visits, patients may have 1 support person aged 58 or older with them. For treatment visits, patients cannot have anyone with them due to current Covid guidelines and our immunocompromised population.   Cetuximab injection What is this medication? CETUXIMAB (se TUX i mab) is a monoclonal antibody. It is used to treat colorectal cancer and head and neck cancer. This medicine may be used for other purposes; ask your health care provider or pharmacist if you have questions. COMMON  BRAND NAME(S): Erbitux What should I tell my care team before I take this medication? They need to know if you have any of these conditions: heart disease history of irregular heartbeat history of low levels of calcium, magnesium, or potassium in the blood history of tick bites lung or breathing disease, like  asthma red meat allergy an unusual or allergic reaction to cetuximab, other medicines, foods, dyes, or preservatives pregnant or trying to get pregnant breast-feeding How should I use this medication? This drug is given as an infusion into a vein. It is administered in a hospital or clinic by a specially trained health care professional. Talk to your pediatrician regarding the use of this medicine in children. Special care may be needed. Overdosage: If you think you have taken too much of this medicine contact a poison control center or emergency room at once. NOTE: This medicine is only for you. Do not share this medicine with others. What if I miss a dose? It is important not to miss your dose. Call your doctor or health care professional if you are unable to keep an appointment. What may interact with this medication? Interactions are not expected. This list may not describe all possible interactions. Give your health care provider a list of all the medicines, herbs, non-prescription drugs, or dietary supplements you use. Also tell them if you smoke, drink alcohol, or use illegal drugs. Some items may interact with your medicine. What should I watch for while using this medication? Visit your doctor or health care professional for regular checks on your progress. This drug may make you feel generally unwell. This is not uncommon, as chemotherapy can affect healthy cells as well as cancer cells. Report any side effects. Continue your course of treatment even though you feel ill unless your doctor tells you to stop. This medicine can make you more sensitive to the sun. Keep out of the sun while taking this medicine and for 2 months after the last dose. If you cannot avoid being in the sun, wear protective clothing and use sunscreen. Do not use sun lamps or tanning beds/booths. You may need blood work done while you are taking this medicine. In some cases, you may be given additional medicines to  help with side effects. Follow all directions for their use. Call your doctor or health care professional for advice if you get a fever, chills or sore throat, or other symptoms of a cold or flu. Do not treat yourself. This drug decreases your body's ability to fight infections. Try to avoid being around people who are sick. Avoid taking products that contain aspirin, acetaminophen, ibuprofen, naproxen, or ketoprofen unless instructed by your doctor. These medicines may hide a fever. Do not become pregnant while taking this medicine. Women should inform their doctor if they wish to become pregnant or think they might be pregnant. There is a potential for serious side effects to an unborn child. Use adequate birth control methods. Avoid pregnancy for at least 2 months after your last dose. Talk to your health care professional or pharmacist for more information. Do not breast-feed an infant while taking this medicine or during the 2 months after your last dose. What side effects may I notice from receiving this medication? Side effects that you should report to your doctor or health care professional as soon as possible: allergic reactions like skin rash, itching or hives, swelling of the face, lips, or tongue breathing problems changes in vision fast, irregular heartbeat feeling faint or  lightheaded, falls fever, chills mouth sores redness, blistering, peeling or loosening of the skin, including inside the mouth trouble passing urine or change in the amount of urine unusually weak or tired Side effects that usually do not require medical attention (report to your doctor or health care professional if they continue or are bothersome): changes in skin like acne, cracks, skin dryness constipation diarrhea headache nail changes nausea, vomiting stomach upset weight loss This list may not describe all possible side effects. Call your doctor for medical advice about side effects. You may report  side effects to FDA at 1-800-FDA-1088. Where should I keep my medication? This drug is given in a hospital or clinic and will not be stored at home. NOTE: This sheet is a summary. It may not cover all possible information. If you have questions about this medicine, talk to your doctor, pharmacist, or health care provider.  2023 Elsevier/Gold Standard (2021-04-09 00:00:00)

## 2021-10-20 NOTE — Progress Notes (Signed)
[facility] OFFICE PROGRESS NOTE   Diagnosis: Head and neck cancer  INTERVAL HISTORY:   Bradley Oliver returns as scheduled.  He completed cycle 2 cetuximab 10/06/2021.  He denies nausea/vomiting.  No mouth sores.  No diarrhea.  No skin rash.  No signs or symptoms of allergic reaction.  Improved oral intake.  Reports he is still "physically weak" but feels some stronger overall.  Objective:  Vital signs in last 24 hours:  Blood pressure 98/77, pulse 98, temperature 97.7 F (36.5 C), temperature source Oral, resp. rate 18, height 5' 10"  (1.778 m), weight 100 lb (45.4 kg), SpO2 100 %.    HEENT: No thrush or ulcers. Resp: Decreased breath sounds at the left compared to the right chest.  No respiratory distress. Cardio: Regular rate and rhythm. GI: No hepatosplenomegaly. Vascular: No leg edema. Skin: No rash.   Lab Results:  Lab Results  Component Value Date   WBC 6.0 10/20/2021   HGB 12.6 (L) 10/20/2021   HCT 38.7 (L) 10/20/2021   MCV 98.0 10/20/2021   PLT 358 10/20/2021   NEUTROABS 4.7 10/20/2021    Imaging:  No results found.  Medications: I have reviewed the patient's current medications.  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, stage IVa (T2N2bM0) HPV positive 08/2014-09/2014-CRT 70 Gray in 35 fractions with cisplatin 20 mg per metered squared days 1-5, weeks 1 and 5 Recurrent disease April 2019-new left upper lobe cavitary lesion, infiltrative left hilar mass, prevascular and mediastinal adenopathy 09/28/2017-flexible bronchoscopy with EBUS-biopsy station 7 node, squamous cell carcinoma p16 positive 10/2017-12/2018-systemic therapy, carboplatin/docetaxel/pembrolizumab x5 followed by maintenance pembrolizumab, progression in 06/2018, 09/2018, and 12/2018 while on pembrolizumab-continue due to slow mild progression 07/13/2018-disease progression with new left upper lobe  posterior segment lesion, new left upper lobe nodule, new left apical nodularity with subsequent scans 10/05/2018 and 01/11/2019 confirming disease progression 10/30/2020-PET add [facility] posterior mediastinal mass involving T5 and T6 and left posterior fifth and sixth ribs, hypermetabolic soft tissue within the prevascular mediastinum contiguous with the posterior mediastinal mass, hypermetabolic spiculated left upper lobe nodules, small left pleural effusion, focal small bowel uptake in left upper quadrant abutting the transverse colon Leg weakness and lower body numbness beginning 03/24/2021, fall prior to presenting to the emergency room 04/01/2021 MRI cervical, thoracic, and lumbar spine level 04/02/2021-no evidence of metastatic disease of the cervical spine, 8 mm enhancing lesion in the inferior aspect of L5-nonspecific, no other evidence of metastatic disease in the lumbar spine, large destructive and infiltrative metastatic deposit centered at the left paraspinous region involving T4-T7 with associated epidural extension with severe spinal stenosis, cord compression, cord signal changes at T4-5 through T6-7, metastatic lesion extends to involve the posterior medial aspect of the left lung and left mediastinum/hilar region, pathologic fracture of T5, 1 cm enhancing lesion in T11 indeterminate CTs 05/31/2021-no evidence of recurrent disease in the neck, decreased size of left pleural/mediastinal and thoracic paraspinal mass, minimally improved left pleural effusion, mild enlargement of a superior segment right lower lobe nodule, destructive findings of the left fifth and sixth ribs.  Callus formation associated with a healing fracture of the right anterior fourth rib. Cycle 1 Xeloda 06/14/2021 Cycle 2 Xeloda 07/05/2021 Cycle 3 Xeloda 07/26/2021 Cycle 4 Xeloda 08/16/2021 CT chest 09/07/2021-large mass of the left pleura, mediastinum and thoracic paraspinal region slightly decreased in size.   Increased left pleural nodularity and increased size of moderate left pleural effusion.  Increased  peribronchovascular thickening with associated new bronchovascular left upper lobe pulmonary nodule measuring up to 2.8 cm.  New ill-defined lesion left hepatic lobe.  Interval decreased size of a right lower lobe solid pulmonary nodule.  New sclerotic lesions of the anterior fourth and lateral seventh left ribs.  New right greater than left bilateral anterior rib fractures Cycle 1 cetuximab 09/22/2021 Cycle 2 cetuximab 10/06/2021 Cycle 3 cetuximab 10/20/2021   2.  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  He has completed 2 cycles of cetuximab.  Plan to proceed with cycle 3 today as scheduled.  He will return for follow-up and cetuximab in 2 weeks.  We are available to see him sooner if needed.    Ned Card ANP/GNP-BC   10/20/2021  11:29 AM

## 2021-10-20 NOTE — Progress Notes (Signed)
Patient seen by Lisa Thomas NP today  Vitals are within treatment parameters.  Labs reviewed by Lisa Thomas NP and are within treatment parameters.  Per physician team, patient is ready for treatment and there are NO modifications to the treatment plan.     

## 2021-10-25 ENCOUNTER — Other Ambulatory Visit: Payer: Self-pay | Admitting: Oncology

## 2021-10-27 ENCOUNTER — Other Ambulatory Visit: Payer: Self-pay | Admitting: Oncology

## 2021-10-28 ENCOUNTER — Encounter: Payer: Self-pay | Admitting: Oncology

## 2021-10-28 ENCOUNTER — Telehealth: Payer: Self-pay

## 2021-10-28 NOTE — Telephone Encounter (Incomplete)
TC from Pt stating he would like a refill on zofran and a cream for

## 2021-10-31 ENCOUNTER — Other Ambulatory Visit: Payer: Self-pay | Admitting: Oncology

## 2021-11-03 ENCOUNTER — Inpatient Hospital Stay: Payer: Medicare PPO | Admitting: Nurse Practitioner

## 2021-11-03 ENCOUNTER — Inpatient Hospital Stay: Payer: Medicare PPO | Attending: Internal Medicine

## 2021-11-03 ENCOUNTER — Inpatient Hospital Stay: Payer: Medicare PPO

## 2021-11-03 ENCOUNTER — Other Ambulatory Visit: Payer: Self-pay | Admitting: *Deleted

## 2021-11-03 ENCOUNTER — Encounter: Payer: Self-pay | Admitting: Nurse Practitioner

## 2021-11-03 ENCOUNTER — Other Ambulatory Visit: Payer: Self-pay

## 2021-11-03 VITALS — BP 98/76 | HR 90 | Temp 98.0°F | Resp 20

## 2021-11-03 VITALS — BP 96/60 | HR 98 | Temp 98.1°F | Resp 18 | Ht 70.0 in | Wt 102.2 lb

## 2021-11-03 DIAGNOSIS — J90 Pleural effusion, not elsewhere classified: Secondary | ICD-10-CM | POA: Insufficient documentation

## 2021-11-03 DIAGNOSIS — C099 Malignant neoplasm of tonsil, unspecified: Secondary | ICD-10-CM | POA: Diagnosis present

## 2021-11-03 DIAGNOSIS — Z5112 Encounter for antineoplastic immunotherapy: Secondary | ICD-10-CM | POA: Diagnosis present

## 2021-11-03 DIAGNOSIS — C7951 Secondary malignant neoplasm of bone: Secondary | ICD-10-CM | POA: Insufficient documentation

## 2021-11-03 LAB — CMP (CANCER CENTER ONLY)
ALT: 9 U/L (ref 0–44)
AST: 23 U/L (ref 15–41)
Albumin: 3.3 g/dL — ABNORMAL LOW (ref 3.5–5.0)
Alkaline Phosphatase: 86 U/L (ref 38–126)
Anion gap: 11 (ref 5–15)
BUN: 15 mg/dL (ref 8–23)
CO2: 24 mmol/L (ref 22–32)
Calcium: 9.4 mg/dL (ref 8.9–10.3)
Chloride: 103 mmol/L (ref 98–111)
Creatinine: 0.53 mg/dL — ABNORMAL LOW (ref 0.61–1.24)
GFR, Estimated: >60 mL/min (ref >60)
Glucose, Bld: 123 mg/dL — ABNORMAL HIGH (ref 70–99)
Potassium: 3.7 mmol/L (ref 3.5–5.1)
Sodium: 138 mmol/L (ref 135–145)
Total Bilirubin: 0.6 mg/dL (ref 0.3–1.2)
Total Protein: 6 g/dL — ABNORMAL LOW (ref 6.5–8.1)

## 2021-11-03 LAB — CBC WITH DIFFERENTIAL (CANCER CENTER ONLY)
Abs Immature Granulocytes: 0.04 10*3/uL (ref 0.00–0.07)
Basophils Absolute: 0 10*3/uL (ref 0.0–0.1)
Basophils Relative: 0 %
Eosinophils Absolute: 0.1 10*3/uL (ref 0.0–0.5)
Eosinophils Relative: 2 %
HCT: 39.8 % (ref 39.0–52.0)
Hemoglobin: 12.9 g/dL — ABNORMAL LOW (ref 13.0–17.0)
Immature Granulocytes: 1 %
Lymphocytes Relative: 6 %
Lymphs Abs: 0.4 10*3/uL — ABNORMAL LOW (ref 0.7–4.0)
MCH: 30.8 pg (ref 26.0–34.0)
MCHC: 32.4 g/dL (ref 30.0–36.0)
MCV: 95 fL (ref 80.0–100.0)
Monocytes Absolute: 0.6 10*3/uL (ref 0.1–1.0)
Monocytes Relative: 10 %
Neutro Abs: 4.8 10*3/uL (ref 1.7–7.7)
Neutrophils Relative %: 81 %
Platelet Count: 391 10*3/uL (ref 150–400)
RBC: 4.19 MIL/uL — ABNORMAL LOW (ref 4.22–5.81)
RDW: 15.5 % (ref 11.5–15.5)
WBC Count: 5.9 10*3/uL (ref 4.0–10.5)
nRBC: 0 % (ref 0.0–0.2)

## 2021-11-03 LAB — MAGNESIUM: Magnesium: 1.6 mg/dL — ABNORMAL LOW (ref 1.7–2.4)

## 2021-11-03 MED ORDER — SODIUM CHLORIDE 0.9 % IV SOLN: Freq: Once | INTRAVENOUS | Status: AC

## 2021-11-03 MED ORDER — EMPTY CONTAINERS FLEXIBLE MISC
500.0000 mg/m2 | Freq: Once | Status: AC
  Administered 2021-11-03: 700 mg via INTRAVENOUS
  Filled 2021-11-03: qty 200

## 2021-11-03 MED ORDER — MAGNESIUM OXIDE -MG SUPPLEMENT 400 (240 MG) MG PO TABS
400.0000 mg | ORAL_TABLET | Freq: Two times a day (BID) | ORAL | 2 refills | Status: AC
Start: 2021-11-03 — End: ?

## 2021-11-03 MED ORDER — DIPHENHYDRAMINE HCL 50 MG/ML IJ SOLN
50.0000 mg | Freq: Once | INTRAMUSCULAR | Status: AC
  Administered 2021-11-03: 50 mg via INTRAVENOUS
  Filled 2021-11-03: qty 1

## 2021-11-03 MED ORDER — FAMOTIDINE IN NACL 20-0.9 MG/50ML-% IV SOLN
20.0000 mg | Freq: Once | INTRAVENOUS | Status: AC
  Administered 2021-11-03: 20 mg via INTRAVENOUS
  Filled 2021-11-03: qty 50

## 2021-11-03 MED ORDER — DOXYCYCLINE HYCLATE 100 MG PO TABS: 100.0000 mg | ORAL_TABLET | Freq: Two times a day (BID) | ORAL | 2 refills | Status: DC

## 2021-11-03 NOTE — Progress Notes (Signed)
Magnesium Oxide 400 mg  BID prescription sent in

## 2021-11-03 NOTE — Progress Notes (Signed)
[place] OFFICE PROGRESS NOTE   Diagnosis: Head and neck cancer  INTERVAL HISTORY:   [name] returns as scheduled.  He completed cycle 3 cetuximab 10/20/2021.  He denies diarrhea.  No rash.  No signs of allergic reaction.  Describes pain as "not bad".  Is concerned about the left great toe.  Reports prior surgery with "titanium screws".  Toe is sore.  He also has a few sores involving several toes on the right foot.  Reports symptoms predated start of cetuximab.  Objective:  Vital signs in last 24 hours:  Blood pressure 96/60, pulse 98, temperature 98.1 F (36.7 C), temperature source Oral, resp. rate 18, height 5' 10"  (1.778 m), weight 102 lb 3.2 oz (46.4 kg), SpO2 96 %.    HEENT: No thrush or ulcers. Resp: Decreased breath sounds left compared to right chest.  No respiratory distress. Cardio: Regular rate and rhythm. GI: No hepatosplenomegaly. Vascular: No leg edema. Skin: Left great toe with erythema/tenderness at the base of the nail, associated tenderness.  Some drainage noted.  Several toes on the right foot with superficial abrasions.   Lab Results:  Lab Results  Component Value Date   WBC 5.9 11/03/2021   HGB 12.9 (L) 11/03/2021   HCT 39.8 11/03/2021   MCV 95.0 11/03/2021   PLT 391 11/03/2021   NEUTROABS 4.8 11/03/2021    Imaging:  No results found.  Medications: I have reviewed the patient's current medications.  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, stage IVa (T2N2bM0) HPV positive 08/2014-09/2014-CRT 70 Gray in 35 fractions with cisplatin 20 mg per metered squared days 1-5, weeks 1 and 5 Recurrent disease April 2019-new left upper lobe cavitary lesion, infiltrative left hilar mass, prevascular and mediastinal adenopathy 09/28/2017-flexible bronchoscopy with EBUS-biopsy station 7 node, squamous cell carcinoma p16 positive 10/2017-12/2018-systemic  therapy, carboplatin/docetaxel/pembrolizumab x5 followed by maintenance pembrolizumab, progression in 06/2018, 09/2018, and 12/2018 while on pembrolizumab-continue due to slow mild progression 07/13/2018-disease progression with new left upper lobe posterior segment lesion, new left upper lobe nodule, new left apical nodularity with subsequent scans 10/05/2018 and 01/11/2019 confirming disease progression 10/30/2020-PET add [facility] posterior mediastinal mass involving T5 and T6 and left posterior fifth and sixth ribs, hypermetabolic soft tissue within the prevascular mediastinum contiguous with the posterior mediastinal mass, hypermetabolic spiculated left upper lobe nodules, small left pleural effusion, focal small bowel uptake in left upper quadrant abutting the transverse colon Leg weakness and lower body numbness beginning 03/24/2021, fall prior to presenting to the emergency room 04/01/2021 MRI cervical, thoracic, and lumbar spine level 04/02/2021-no evidence of metastatic disease of the cervical spine, 8 mm enhancing lesion in the inferior aspect of L5-nonspecific, no other evidence of metastatic disease in the lumbar spine, large destructive and infiltrative metastatic deposit centered at the left paraspinous region involving T4-T7 with associated epidural extension with severe spinal stenosis, cord compression, cord signal changes at T4-5 through T6-7, metastatic lesion extends to involve the posterior medial aspect of the left lung and left mediastinum/hilar region, pathologic fracture of T5, 1 cm enhancing lesion in T11 indeterminate CTs 05/31/2021-no evidence of recurrent disease in the neck, decreased size of left pleural/mediastinal and thoracic paraspinal mass, minimally improved left pleural effusion, mild enlargement of a superior segment right lower lobe nodule, destructive findings of the left fifth and sixth ribs.  Callus formation associated with a healing fracture of the right  anterior fourth rib. Cycle 1 Xeloda 06/14/2021 Cycle  2 Xeloda 07/05/2021 Cycle 3 Xeloda 07/26/2021 Cycle 4 Xeloda 08/16/2021 CT chest 09/07/2021-large mass of the left pleura, mediastinum and thoracic paraspinal region slightly decreased in size.  Increased left pleural nodularity and increased size of moderate left pleural effusion.  Increased peribronchovascular thickening with associated new bronchovascular left upper lobe pulmonary nodule measuring up to 2.8 cm.  New ill-defined lesion left hepatic lobe.  Interval decreased size of a right lower lobe solid pulmonary nodule.  New sclerotic lesions of the anterior fourth and lateral seventh left ribs.  New right greater than left bilateral anterior rib fractures Cycle 1 cetuximab 09/22/2021 Cycle 2 cetuximab 10/06/2021 Cycle 3 cetuximab 10/20/2021 Cycle 4 cetuximab 11/03/2021   2.  Spinal cord compression syndrome secondary to the thoracic spine/mediastinal mass Left T5 transpedicular decompression, bilateral T4, T5, T6 laminectomies for resection of epidural tumor 04/03/2021 Metastatic squamous cell carcinoma, Foundation 1-tumor mutation burden 21, MSS, PIK3CA, Palliative radiation to thoracic spine and central chest 04/29/2021    Disposition: [name] appears unchanged.  He has completed 3 cycles of cetuximab.  Plan to proceed with cycle 4 today as scheduled.  He has discomfort involving multiple toes.  Symptoms predated cetuximab, but cetuximab may be contributing.  He appears to have an infection at the left great toe.  He will begin doxycycline.  We are trying to arrange for follow-up with his podiatrist.   He will return for lab, follow-up, cetuximab in 2 weeks.  We are available to see him sooner if needed.  Patient seen with Dr. [name].    Ned Card ANP/GNP-BC   11/03/2021  11:10 AM  This was a shared visit with Ned Card.  [name] was interviewed and examined.  He has developed loosening and inflammation at the left first  toenail.  There are other areas of superficial skin breakdown at the distal toes.  He will begin doxycycline.  It is unclear whether these changes are related to cetuximab therapy.  I was present for greater than 50% of today's visit.  I performed medical decision making.  Julieanne Manson, MD

## 2021-11-03 NOTE — Patient Instructions (Signed)
[place]   Discharge Instructions: Thank you for choosing [facility] to provide your oncology and hematology care.   If you have a lab appointment with the [facility], please go directly to the [facility] and check in at the registration area.   Wear comfortable clothing and clothing appropriate for easy access to any Portacath or PICC line.   We strive to give you quality time with your provider. You may need to reschedule your appointment if you arrive late (15 or more minutes).  Arriving late affects you and other patients whose appointments are after yours.  Also, if you miss three or more appointments without notifying the office, you may be dismissed from the clinic at the provider's discretion.      For prescription refill requests, have your pharmacy contact our office and allow 72 hours for refills to be completed.    Today you received the following chemotherapy and/or immunotherapy agents Cetuximab (ERBITUX).      To help prevent nausea and vomiting after your treatment, we encourage you to take your nausea medication as directed.  BELOW ARE SYMPTOMS THAT SHOULD BE REPORTED IMMEDIATELY: *FEVER GREATER THAN 100.4 F (38 C) OR HIGHER *CHILLS OR SWEATING *NAUSEA AND VOMITING THAT IS NOT CONTROLLED WITH YOUR NAUSEA MEDICATION *UNUSUAL SHORTNESS OF BREATH *UNUSUAL BRUISING OR BLEEDING *URINARY PROBLEMS (pain or burning when urinating, or frequent urination) *BOWEL PROBLEMS (unusual diarrhea, constipation, pain near the anus) TENDERNESS IN MOUTH AND THROAT WITH OR WITHOUT PRESENCE OF ULCERS (sore throat, sores in mouth, or a toothache) UNUSUAL RASH, SWELLING OR PAIN  UNUSUAL VAGINAL DISCHARGE OR ITCHING   Items with * indicate a potential emergency and should be followed up as soon as possible or go to the Emergency Department if any problems should occur.  Please show the CHEMOTHERAPY ALERT CARD or IMMUNOTHERAPY ALERT CARD at  check-in to the Emergency Department and triage nurse.  Should you have questions after your visit or need to cancel or reschedule your appointment, please contact [facility]  Dept: [phone]  and follow the prompts.  Office hours are 8:00 a.m. to 4:30 p.m. Monday - Friday. Please note that voicemails left after 4:00 p.m. may not be returned until the following business day.  We are closed weekends and major holidays. You have access to a nurse at all times for urgent questions. Please call the main number to the clinic Dept: [phone] and follow the prompts.   For any non-urgent questions, you may also contact your provider using MyChart. We now offer e-Visits for anyone 19 and older to request care online for non-urgent symptoms. For details visit [email or web address].   Also download the MyChart app! Go to the app store, search "MyChart", open the app, select [facility], and log in with your MyChart username and password.  Masks are optional in the cancer centers. If you would like for your care team to wear a mask while they are taking care of you, please let them know. For doctor visits, patients may have with them one support person who is at least 65 years old. At this time, visitors are not allowed in the infusion area.  Cetuximab injection What is this medication? CETUXIMAB (se TUX i mab) is a monoclonal antibody. It is used to treat colorectal cancer and head and neck cancer. This medicine may be used for other purposes; ask your health care provider or pharmacist if you have questions. COMMON BRAND  NAME(S): Erbitux What should I tell my care team before I take this medication? They need to know if you have any of these conditions: heart disease history of irregular heartbeat history of low levels of calcium, magnesium, or potassium in the blood history of tick bites lung or breathing disease, like asthma red meat allergy an unusual or allergic  reaction to cetuximab, other medicines, foods, dyes, or preservatives pregnant or trying to get pregnant breast-feeding How should I use this medication? This drug is given as an infusion into a vein. It is administered in a hospital or clinic by a specially trained health care professional. Talk to your pediatrician regarding the use of this medicine in children. Special care may be needed. Overdosage: If you think you have taken too much of this medicine contact a poison control center or emergency room at once. NOTE: This medicine is only for you. Do not share this medicine with others. What if I miss a dose? It is important not to miss your dose. Call your doctor or health care professional if you are unable to keep an appointment. What may interact with this medication? Interactions are not expected. This list may not describe all possible interactions. Give your health care provider a list of all the medicines, herbs, non-prescription drugs, or dietary supplements you use. Also tell them if you smoke, drink alcohol, or use illegal drugs. Some items may interact with your medicine. What should I watch for while using this medication? Visit your doctor or health care professional for regular checks on your progress. This drug may make you feel generally unwell. This is not uncommon, as chemotherapy can affect healthy cells as well as cancer cells. Report any side effects. Continue your course of treatment even though you feel ill unless your doctor tells you to stop. This medicine can make you more sensitive to the sun. Keep out of the sun while taking this medicine and for 2 months after the last dose. If you cannot avoid being in the sun, wear protective clothing and use sunscreen. Do not use sun lamps or tanning beds/booths. You may need blood work done while you are taking this medicine. In some cases, you may be given additional medicines to help with side effects. Follow all directions for  their use. Call your doctor or health care professional for advice if you get a fever, chills or sore throat, or other symptoms of a cold or flu. Do not treat yourself. This drug decreases your body's ability to fight infections. Try to avoid being around people who are sick. Avoid taking products that contain aspirin, acetaminophen, ibuprofen, naproxen, or ketoprofen unless instructed by your doctor. These medicines may hide a fever. Do not become pregnant while taking this medicine. Women should inform their doctor if they wish to become pregnant or think they might be pregnant. There is a potential for serious side effects to an unborn child. Use adequate birth control methods. Avoid pregnancy for at least 2 months after your last dose. Talk to your health care professional or pharmacist for more information. Do not breast-feed an infant while taking this medicine or during the 2 months after your last dose. What side effects may I notice from receiving this medication? Side effects that you should report to your doctor or health care professional as soon as possible: allergic reactions like skin rash, itching or hives, swelling of the face, lips, or tongue breathing problems changes in vision fast, irregular heartbeat feeling faint or lightheaded,  falls fever, chills mouth sores redness, blistering, peeling or loosening of the skin, including inside the mouth trouble passing urine or change in the amount of urine unusually weak or tired Side effects that usually do not require medical attention (report to your doctor or health care professional if they continue or are bothersome): changes in skin like acne, cracks, skin dryness constipation diarrhea headache nail changes nausea, vomiting stomach upset weight loss This list may not describe all possible side effects. Call your doctor for medical advice about side effects. You may report side effects to FDA at 1-800-FDA-1088. Where should  I keep my medication? This drug is given in a hospital or clinic and will not be stored at home. NOTE: This sheet is a summary. It may not cover all possible information. If you have questions about this medicine, talk to your doctor, pharmacist, or health care provider.  2023 Elsevier/Gold Standard (2021-04-09 00:00:00)

## 2021-11-03 NOTE — Progress Notes (Signed)
Patient seen by Lisa Thomas NP today  Vitals are within treatment parameters.  Labs reviewed by Lisa Thomas NP CBC diff reviewed and within treatment parameters, CMP pending.  Per physician team, patient is ready for treatment and there are NO modifications to the treatment plan.  

## 2021-11-05 ENCOUNTER — Telehealth: Payer: Self-pay

## 2021-11-05 ENCOUNTER — Other Ambulatory Visit: Payer: Self-pay

## 2021-11-05 DIAGNOSIS — C099 Malignant neoplasm of tonsil, unspecified: Secondary | ICD-10-CM

## 2021-11-05 NOTE — Telephone Encounter (Signed)
Faxed over a referral podiatry [phone] to Dr Twanna Hy. Ajlouny's office

## 2021-11-14 ENCOUNTER — Other Ambulatory Visit: Payer: Self-pay | Admitting: Oncology

## 2021-11-17 ENCOUNTER — Encounter (HOSPITAL_BASED_OUTPATIENT_CLINIC_OR_DEPARTMENT_OTHER): Payer: Self-pay

## 2021-11-17 ENCOUNTER — Other Ambulatory Visit (HOSPITAL_BASED_OUTPATIENT_CLINIC_OR_DEPARTMENT_OTHER): Payer: Self-pay

## 2021-11-17 ENCOUNTER — Inpatient Hospital Stay: Payer: Medicare PPO

## 2021-11-17 ENCOUNTER — Encounter: Payer: Self-pay | Admitting: *Deleted

## 2021-11-17 ENCOUNTER — Inpatient Hospital Stay: Payer: Medicare PPO | Admitting: Nurse Practitioner

## 2021-11-17 ENCOUNTER — Ambulatory Visit (HOSPITAL_BASED_OUTPATIENT_CLINIC_OR_DEPARTMENT_OTHER)
Admission: RE | Admit: 2021-11-17 | Discharge: 2021-11-17 | Disposition: A | Discharge status: Home / Self Care | Payer: Medicare PPO | Source: Ambulatory Visit | Attending: Nurse Practitioner | Admitting: Nurse Practitioner

## 2021-11-17 ENCOUNTER — Telehealth: Payer: Self-pay

## 2021-11-17 ENCOUNTER — Encounter: Payer: Self-pay | Admitting: Nurse Practitioner

## 2021-11-17 VITALS — BP 98/64 | HR 99 | Temp 98.1°F | Resp 18 | Ht 70.0 in | Wt 97.4 lb

## 2021-11-17 DIAGNOSIS — C099 Malignant neoplasm of tonsil, unspecified: Secondary | ICD-10-CM | POA: Insufficient documentation

## 2021-11-17 LAB — CBC WITH DIFFERENTIAL (CANCER CENTER ONLY)
Abs Immature Granulocytes: 0.11 10*3/uL — ABNORMAL HIGH (ref 0.00–0.07)
Basophils Absolute: 0.1 10*3/uL (ref 0.0–0.1)
Basophils Relative: 1 %
Eosinophils Absolute: 0.2 10*3/uL (ref 0.0–0.5)
Eosinophils Relative: 2 %
HCT: 40.7 % (ref 39.0–52.0)
Hemoglobin: 13 g/dL (ref 13.0–17.0)
Immature Granulocytes: 1 %
Lymphocytes Relative: 8 %
Lymphs Abs: 0.7 10*3/uL (ref 0.7–4.0)
MCH: 29.3 pg (ref 26.0–34.0)
MCHC: 31.9 g/dL (ref 30.0–36.0)
MCV: 91.9 fL (ref 80.0–100.0)
Monocytes Absolute: 0.7 10*3/uL (ref 0.1–1.0)
Monocytes Relative: 8 %
Neutro Abs: 7.2 10*3/uL (ref 1.7–7.7)
Neutrophils Relative %: 80 %
Platelet Count: 421 10*3/uL — ABNORMAL HIGH (ref 150–400)
RBC: 4.43 MIL/uL (ref 4.22–5.81)
RDW: 14.6 % (ref 11.5–15.5)
WBC Count: 8.9 10*3/uL (ref 4.0–10.5)
nRBC: 0 % (ref 0.0–0.2)

## 2021-11-17 LAB — CMP (CANCER CENTER ONLY)
ALT: 7 U/L (ref 0–44)
AST: 22 U/L (ref 15–41)
Albumin: 3.4 g/dL — ABNORMAL LOW (ref 3.5–5.0)
Alkaline Phosphatase: 108 U/L (ref 38–126)
Anion gap: 17 — ABNORMAL HIGH (ref 5–15)
BUN: 14 mg/dL (ref 8–23)
CO2: 23 mmol/L (ref 22–32)
Calcium: 9.9 mg/dL (ref 8.9–10.3)
Chloride: 98 mmol/L (ref 98–111)
Creatinine: 0.7 mg/dL (ref 0.61–1.24)
GFR, Estimated: >60 mL/min (ref >60)
Glucose, Bld: 161 mg/dL — ABNORMAL HIGH (ref 70–99)
Potassium: 3.3 mmol/L — ABNORMAL LOW (ref 3.5–5.1)
Sodium: 138 mmol/L (ref 135–145)
Total Bilirubin: 0.7 mg/dL (ref 0.3–1.2)
Total Protein: 6.3 g/dL — ABNORMAL LOW (ref 6.5–8.1)

## 2021-11-17 LAB — MAGNESIUM: Magnesium: 1.8 mg/dL (ref 1.7–2.4)

## 2021-11-17 MED ORDER — IOHEXOL 300 MG/ML  SOLN
100.0000 mL | Freq: Once | INTRAMUSCULAR | Status: AC | PRN
  Administered 2021-11-17: 50 mL via INTRAVENOUS

## 2021-11-17 NOTE — Telephone Encounter (Signed)
Call report from [facility] on [facility] aware.

## 2021-11-17 NOTE — Progress Notes (Signed)
Patient seen by Lisa Thomas NP today  Vitals are within treatment parameters.  Labs reviewed by Lisa Thomas NP and are within treatment parameters.  Per physician team, patient will not be receiving treatment today.  

## 2021-11-17 NOTE — Progress Notes (Signed)
[place] OFFICE PROGRESS NOTE   Diagnosis: Head and neck cancer  INTERVAL HISTORY:   Mr. Sermeno returns as scheduled.  He completed cycle 4 cetuximab 11/03/2021.  He has nausea when sitting in the wheelchair.  He has back pain when sitting up.  He does not have pain when he lays flat.  No mouth sores.  No rash.  No diarrhea.  He reports he is scheduled to see podiatry next week.  Appetite is poor.  He has lost weight.  Objective:  Vital signs in last 24 hours:  Blood pressure 98/64, pulse 99, temperature 98.1 F (36.7 C), resp. rate 18, height 5' 10" (1.778 m), weight 97 lb 6.4 oz (44.2 kg), SpO2 95 %.    HEENT: No thrush or ulcers. Resp: Decreased breath sounds left compared to right chest.  No respiratory distress. Cardio: Regular rate and rhythm. GI: No hepatosplenomegaly. Vascular: No leg edema. Skin: Superficial ulceration right second and third toes.  Left great toe with purulent drainage, foul odor; nail is loose.  Skin in general is dry.   Lab Results:  Lab Results  Component Value Date   WBC 8.9 11/17/2021   HGB 13.0 11/17/2021   HCT 40.7 11/17/2021   MCV 91.9 11/17/2021   PLT 421 (H) 11/17/2021   NEUTROABS 7.2 11/17/2021    Imaging:  No results found.  Medications: I have reviewed the patient's current medications.  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, stage IVa (T2N2bM0) HPV positive 08/2014-09/2014-CRT 70 Gray in 35 fractions with cisplatin 20 mg per metered squared days 1-5, weeks 1 and 5 Recurrent disease April 2019-new left upper lobe cavitary lesion, infiltrative left hilar mass, prevascular and mediastinal adenopathy 09/28/2017-flexible bronchoscopy with EBUS-biopsy station 7 node, squamous cell carcinoma p16 positive 10/2017-12/2018-systemic therapy, carboplatin/docetaxel/pembrolizumab x5 followed by maintenance pembrolizumab, progression in  06/2018, 09/2018, and 12/2018 while on pembrolizumab-continue due to slow mild progression 07/13/2018-disease progression with new left upper lobe posterior segment lesion, new left upper lobe nodule, new left apical nodularity with subsequent scans 10/05/2018 and 01/11/2019 confirming disease progression 10/30/2020-PET add [facility] posterior mediastinal mass involving T5 and T6 and left posterior fifth and sixth ribs, hypermetabolic soft tissue within the prevascular mediastinum contiguous with the posterior mediastinal mass, hypermetabolic spiculated left upper lobe nodules, small left pleural effusion, focal small bowel uptake in left upper quadrant abutting the transverse colon Leg weakness and lower body numbness beginning 03/24/2021, fall prior to presenting to the emergency room 04/01/2021 MRI cervical, thoracic, and lumbar spine level 04/02/2021-no evidence of metastatic disease of the cervical spine, 8 mm enhancing lesion in the inferior aspect of L5-nonspecific, no other evidence of metastatic disease in the lumbar spine, large destructive and infiltrative metastatic deposit centered at the left paraspinous region involving T4-T7 with associated epidural extension with severe spinal stenosis, cord compression, cord signal changes at T4-5 through T6-7, metastatic lesion extends to involve the posterior medial aspect of the left lung and left mediastinum/hilar region, pathologic fracture of T5, 1 cm enhancing lesion in T11 indeterminate CTs 05/31/2021-no evidence of recurrent disease in the neck, decreased size of left pleural/mediastinal and thoracic paraspinal mass, minimally improved left pleural effusion, mild enlargement of a superior segment right lower lobe nodule, destructive findings of the left fifth and sixth ribs.  Callus formation associated with a healing fracture of the right anterior fourth rib. Cycle 1 Xeloda 06/14/2021 Cycle 2 Xeloda 07/05/2021 Cycle 3 Xeloda 07/26/2021 Cycle  4  Xeloda 08/16/2021 CT chest 09/07/2021-large mass of the left pleura, mediastinum and thoracic paraspinal region slightly decreased in size.  Increased left pleural nodularity and increased size of moderate left pleural effusion.  Increased peribronchovascular thickening with associated new bronchovascular left upper lobe pulmonary nodule measuring up to 2.8 cm.  New ill-defined lesion left hepatic lobe.  Interval decreased size of a right lower lobe solid pulmonary nodule.  New sclerotic lesions of the anterior fourth and lateral seventh left ribs.  New right greater than left bilateral anterior rib fractures Cycle 1 cetuximab 09/22/2021 Cycle 2 cetuximab 10/06/2021 Cycle 3 cetuximab 10/20/2021 Cycle 4 cetuximab 11/03/2021   2.  Spinal cord compression syndrome secondary to the thoracic spine/mediastinal mass Left T5 transpedicular decompression, bilateral T4, T5, T6 laminectomies for resection of epidural tumor 04/03/2021 Metastatic squamous cell carcinoma, Foundation 1-tumor mutation burden 21, MSS, PIK3CA, Palliative radiation to thoracic spine and central chest 04/29/2021    Disposition: Mr. Busch has completed 4 cycles of cetuximab.  There has been no significant improvement in his performance status.  The toe wounds may be toxicity related to cetuximab.  We decided to hold today's treatment and refer for a restaging chest CT.  Toe wound culture will be obtained to direct antibiotic therapy.  He will return as scheduled in 2 weeks.  Patient seen with Dr. [name].    Ned Card ANP/GNP-BC   11/17/2021  10:05 AM   This was a shared visit with Ned Card.  Mr. Hervey Ard was interviewed and examined.  He has multiple ulcers at the dorsum of right sided toes and loosening with purulence at the left great toenail.  Findings are likely in part related to cetuximab toxicity.  We obtained a culture of the purulent drainage.  He is scheduled to see a podiatrist week.  His overall performance status is  poor.  He will be referred for a restaging chest CT today.  The plan is to initiate hospice care if there is radiologic evidence of disease progression.  I was present for greater than 50% of today's visit.  I performed medical decision making.  Julieanne Manson, MD

## 2021-11-18 ENCOUNTER — Telehealth: Payer: Self-pay

## 2021-11-18 NOTE — Telephone Encounter (Signed)
Spoke with pt per Ned Card, NP and confirmed his medications. Pt states that he is taking 1 potassium tablet (20 MEQ) daily. Pt will call [place] [facility] with any questions/concerns.

## 2021-11-19 ENCOUNTER — Other Ambulatory Visit: Payer: Self-pay | Admitting: Nurse Practitioner

## 2021-11-19 ENCOUNTER — Telehealth: Payer: Self-pay

## 2021-11-19 DIAGNOSIS — L089 Local infection of the skin and subcutaneous tissue, unspecified: Secondary | ICD-10-CM

## 2021-11-19 DIAGNOSIS — C099 Malignant neoplasm of tonsil, unspecified: Secondary | ICD-10-CM

## 2021-11-19 MED ORDER — CIPROFLOXACIN HCL 500 MG PO TABS: 500.0000 mg | ORAL_TABLET | Freq: Two times a day (BID) | ORAL | 0 refills | Status: DC

## 2021-11-19 NOTE — Telephone Encounter (Signed)
-----   Message from Ladell Pier, MD sent at 11/18/2021  9:39 PM EDT ----- Please call patient, CT reveals disease progression in the chest with enlarging left chest mass, new spleen and bone lesions I recommend hospice care, we can initiate referral now or after he comes to next office visit

## 2021-11-19 NOTE — Telephone Encounter (Signed)
TC to Pt to inform him about positive cultures from his toe specimens. Informed Pt that a prescription was sent to the pharmacy for Cipro. Pt stated he has already been contacted by the pharmacy and sent his sisters to get the prescription. Also discussed CT scan results with Pt and informed him Dr [name] recommends hospice. Pt declined stated he has a nurse that comes in with [facility] home [facility]. Pt verbalized understanding of issues discussed. No other problems or concerns noted.

## 2021-11-20 LAB — AEROBIC CULTURE W GRAM STAIN (SUPERFICIAL SPECIMEN)

## 2021-11-22 ENCOUNTER — Encounter: Payer: Self-pay | Admitting: *Deleted

## 2021-11-22 ENCOUNTER — Other Ambulatory Visit: Payer: Self-pay | Admitting: Nurse Practitioner

## 2021-11-22 ENCOUNTER — Telehealth: Payer: Self-pay

## 2021-11-22 DIAGNOSIS — C099 Malignant neoplasm of tonsil, unspecified: Secondary | ICD-10-CM

## 2021-11-22 DIAGNOSIS — L089 Local infection of the skin and subcutaneous tissue, unspecified: Secondary | ICD-10-CM

## 2021-11-22 LAB — ANAEROBIC CULTURE W GRAM STAIN

## 2021-11-22 MED ORDER — SULFAMETHOXAZOLE-TRIMETHOPRIM 800-160 MG PO TABS: 1.0000 | ORAL_TABLET | Freq: Two times a day (BID) | ORAL | 0 refills | Status: AC

## 2021-11-22 NOTE — Telephone Encounter (Signed)
-----   Message from Owens Shark, NP sent at 11/22/2021  1:29 PM EDT ----- Please let him know the final culture report on his toe indicates we need to switch his antibiotic.  He can discontinue Cipro.  I sent a new prescription to his pharmacy for Septra DS.  He will take 1 tablet twice a day for 7 days.

## 2021-11-22 NOTE — Progress Notes (Signed)
Canceled future chemo infusion appointments per Dr. [name].

## 2021-11-22 NOTE — Telephone Encounter (Signed)
Patient gave verbal understanding and had no further questions or concerns  

## 2021-11-24 ENCOUNTER — Telehealth: Payer: Self-pay

## 2021-11-24 NOTE — Telephone Encounter (Signed)
Bradley Oliver called and stated he wants a biopsy on his lymph node due to the swelling. I spoke with Dr. [name]. Per Dr. [name] he does not needs one and would discuss any other matter on his need office visit. Patient gave verbal understanding and had no further questions or concerns.

## 2021-11-25 ENCOUNTER — Telehealth: Payer: Self-pay | Admitting: *Deleted

## 2021-11-25 NOTE — Telephone Encounter (Signed)
Palliative Team NP, Gonzella Lex [phone]) called with update that his is holding off on Hospice referral wanting to try holistic measures for now. She offered him an information session with Hospice but has declined. He did not allow her to look at this sacral area or his toe.

## 2021-11-26 ENCOUNTER — Telehealth: Payer: Self-pay

## 2021-11-26 NOTE — Telephone Encounter (Signed)
Bradley Oliver called and stated he went to his podiatry and everything went great. She trim his left big toes nail and treated his wounds. The new Septra is working. Now, he does not need the biopsy on his lymph node.

## 2021-11-27 ENCOUNTER — Encounter: Payer: Self-pay | Admitting: Oncology

## 2021-12-01 ENCOUNTER — Inpatient Hospital Stay: Payer: Medicare PPO

## 2021-12-01 ENCOUNTER — Telehealth: Payer: Self-pay

## 2021-12-01 ENCOUNTER — Inpatient Hospital Stay: Payer: Medicare PPO | Admitting: Oncology

## 2021-12-01 NOTE — Telephone Encounter (Signed)
Pt called in stated he was canceling his appointment for today due to having some people coming to his home. Pt stated he will come to his next appointment that is scheduled.

## 2021-12-03 ENCOUNTER — Other Ambulatory Visit: Payer: Self-pay

## 2021-12-03 ENCOUNTER — Telehealth: Payer: Self-pay

## 2021-12-03 DIAGNOSIS — C099 Malignant neoplasm of tonsil, unspecified: Secondary | ICD-10-CM

## 2021-12-03 NOTE — Telephone Encounter (Signed)
V/m message from Nurse with [facility]) informing that Pt is ready to get hospice care. [facility] inquired about sending referral to [facility]. TC to [place] left v/m stating we will send referral in to [facility]. Referral sent in today.

## 2021-12-11 ENCOUNTER — Encounter: Payer: Self-pay | Admitting: Oncology

## 2021-12-12 ENCOUNTER — Other Ambulatory Visit: Payer: Self-pay | Admitting: Oncology

## 2021-12-13 ENCOUNTER — Telehealth: Payer: Self-pay | Admitting: Oncology

## 2021-12-13 NOTE — Telephone Encounter (Signed)
Pt. left my chart message requesting to cancel appointments for 12/15/21

## 2021-12-15 ENCOUNTER — Ambulatory Visit: Payer: Medicare PPO

## 2021-12-15 ENCOUNTER — Inpatient Hospital Stay: Payer: Medicare PPO

## 2021-12-15 ENCOUNTER — Inpatient Hospital Stay: Payer: Medicare PPO | Admitting: Oncology

## 2022-01-05 ENCOUNTER — Telehealth: Payer: Self-pay

## 2022-01-21 NOTE — Telephone Encounter (Signed)
Correspondence from [facility] Pt passed away this morning.

## 2022-01-21 DEATH — deceased

## 2022-12-02 ENCOUNTER — Other Ambulatory Visit: Payer: Self-pay
